# Patient Record
Sex: Female | Born: 1980 | Race: White | Hispanic: No | Marital: Single
Health system: Midwestern US, Community
[De-identification: ages and names within clinical notes are randomized; demographics above are authoritative.]

## PROBLEM LIST (undated history)

## (undated) DIAGNOSIS — R011 Cardiac murmur, unspecified: Secondary | ICD-10-CM

## (undated) DIAGNOSIS — G2581 Restless legs syndrome: Secondary | ICD-10-CM

## (undated) DIAGNOSIS — M1990 Unspecified osteoarthritis, unspecified site: Secondary | ICD-10-CM

## (undated) DIAGNOSIS — E079 Disorder of thyroid, unspecified: Secondary | ICD-10-CM

## (undated) DIAGNOSIS — F32A Depression, unspecified: Secondary | ICD-10-CM

## (undated) DIAGNOSIS — G4700 Insomnia, unspecified: Secondary | ICD-10-CM

## (undated) DIAGNOSIS — E034 Atrophy of thyroid (acquired): Secondary | ICD-10-CM

## (undated) DIAGNOSIS — F5101 Primary insomnia: Secondary | ICD-10-CM

## (undated) DIAGNOSIS — N809 Endometriosis, unspecified: Secondary | ICD-10-CM

## (undated) DIAGNOSIS — K8000 Calculus of gallbladder with acute cholecystitis without obstruction: Secondary | ICD-10-CM

## (undated) DIAGNOSIS — I10 Essential (primary) hypertension: Secondary | ICD-10-CM

## (undated) DIAGNOSIS — N800 Endometriosis of uterus: Secondary | ICD-10-CM

## (undated) DIAGNOSIS — K8020 Calculus of gallbladder without cholecystitis without obstruction: Secondary | ICD-10-CM

## (undated) DIAGNOSIS — E039 Hypothyroidism, unspecified: Secondary | ICD-10-CM

## (undated) DIAGNOSIS — R509 Fever, unspecified: Secondary | ICD-10-CM

## (undated) HISTORY — DX: Insomnia, unspecified: G47.00

## (undated) HISTORY — PX: CHOLECYSTECTOMY: SHX55

## (undated) HISTORY — DX: Restless legs syndrome: G25.81

## (undated) HISTORY — DX: Depression, unspecified: F32.A

## (undated) HISTORY — DX: Unspecified osteoarthritis, unspecified site: M19.90

## (undated) HISTORY — PX: EYE SURGERY: SHX253

## (undated) HISTORY — DX: Cardiac murmur, unspecified: R01.1

## (undated) HISTORY — DX: Disorder of thyroid, unspecified: E07.9

---

## 2013-09-24 DIAGNOSIS — G2581 Restless legs syndrome: Secondary | ICD-10-CM | POA: Insufficient documentation

## 2013-09-24 DIAGNOSIS — R011 Cardiac murmur, unspecified: Secondary | ICD-10-CM | POA: Insufficient documentation

## 2013-09-24 DIAGNOSIS — N809 Endometriosis, unspecified: Secondary | ICD-10-CM | POA: Insufficient documentation

## 2013-09-24 DIAGNOSIS — F32A Depression, unspecified: Secondary | ICD-10-CM | POA: Insufficient documentation

## 2013-09-24 DIAGNOSIS — G4700 Insomnia, unspecified: Secondary | ICD-10-CM | POA: Insufficient documentation

## 2013-09-24 MED ORDER — SUVOREXANT 10 MG TABLET
10 mg | ORAL_TABLET | Freq: Every evening | ORAL | Status: DC | PRN
Start: 2013-09-24 — End: 2013-09-29

## 2013-09-24 NOTE — Progress Notes (Signed)
[facility]  [address]  [place], [place] [address]  Phone: [phone]                                                                                                [name], MD      CC:  Chief Complaint   Patient presents with   ??? Physical     New pt wellness check   ??? Sleep Problem     Having problems sleeping         HPI:    Carla Patterson is a 33 y.o. female would presents for a physical.  She has a history of endometriosis that has been bothering her for the past year.  She says that she doesn't want to have surgery and is currently on another birth control to try to manage this problem.  She says that her lower abdomen feels swollen and warm.   She says that it hurts worse when she has been on her feet all day.  She is worried about the diagnosis of endometriosis, because it hurts all of the time, not just when she is on her menses.  She has intermittent diarrhea and constipation and has already seen a GI doctor for this issue.  She says that she has had a normal colonoscopy in October.  She is desperate to get a solution for this problem.    She is also a chronic insomniac.  She says that as long as she can remember, she hasn't been able to sleep.  She doesn't have much problem with sleep initiation, she has problems staying asleep.  She is usually only able to get 2-3 hours of sleep.  She is chronically fatigued.             PAST MEDICAL HISTORY:    Allergies   Allergen Reactions   ??? Biaxin [Clarithromycin] Rash     History     Social History   ??? Marital Status: SINGLE     Spouse Name: N/A     Number of Children: N/A   ??? Years of Education: N/A     Social History Main Topics   ??? Smoking status: Never Smoker    ??? Smokeless tobacco: Never Used   ??? Alcohol Use: 0.0 oz/week     0 Not specified per week      Comment: occ   ??? Drug Use: No   ??? Sexual Activity: Yes     Other Topics Concern   ??? Not on file     Social History Narrative   ??? No narrative on file     Family History    Problem Relation Age of Onset   ??? Suicide Mother    ??? Cancer Father      Past Surgical History   Procedure Laterality Date   ??? Hx gyn     ??? Hx wisdom teeth extraction       Patient Active Problem List   Diagnosis Code   ??? Depression 311   ??? 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  Insomnia 780.52     No current outpatient prescriptions on file prior to visit.     No current facility-administered medications on file prior to visit.          ROS:    Review of Systems   Constitutional: Positive for malaise/fatigue. Negative for weight loss.   HENT: Negative for congestion and sore throat.    Eyes: Negative for blurred vision, double vision, photophobia and redness.   Respiratory: Negative for cough, shortness of breath and wheezing.    Cardiovascular: Positive for leg swelling. Negative for chest pain.   Gastrointestinal: Positive for abdominal pain and diarrhea. Negative for nausea, vomiting and constipation.   Genitourinary: Positive for flank pain. Negative for dysuria, urgency and frequency.   Skin: Negative for itching and rash.   Neurological: Negative for dizziness, sensory change, speech change, focal weakness, seizures, loss of consciousness and headaches.   Psychiatric/Behavioral: Negative for depression. The patient is not nervous/anxious and does not have insomnia.           BP 124/82 mmHg   Pulse 88   Temp(Src) 98.6 ??F (37 ??C)   Ht 4' 11.75" (1.518 m)   Wt 169 lb 3.2 oz (76.749 kg)   BMI 33.31 kg/m2   SpO2 99%   LMP 08/03/2013    PHYSICAL EXAM:    Physical Exam   Constitutional: She is oriented to person, place, and time and well-developed, well-nourished, and in no distress. No distress.   HENT:   Head: Normocephalic and atraumatic.   Right Ear: External ear normal.   Left Ear: External ear normal.   Nose: Nose normal.   Mouth/Throat: Oropharynx is clear and moist.   Eyes: Conjunctivae and EOM are normal. Pupils are equal, round, and reactive to light.    Neck: Normal range of motion. Neck supple.   Cardiovascular: Normal rate and regular rhythm.    Murmur (2/6 SEM heard best at LSB) heard.  Pulmonary/Chest: Effort normal and breath sounds normal. No respiratory distress.   Abdominal: Soft. Bowel sounds are normal. She exhibits no distension. There is no tenderness (mild discomfort to palpation). There is no rebound.   Musculoskeletal: Normal range of motion. She exhibits no edema or tenderness.   Lymphadenopathy:     She has no cervical adenopathy.   Neurological: She is alert and oriented to person, place, and time. No cranial nerve deficit. Gait normal.   Skin: Skin is warm and dry. No rash noted. She is not diaphoretic.   Psychiatric: Mood, affect and judgment normal.   Vitals reviewed.          ASSESSMENT & PLAN:      1. Routine general medical examination at a health care facility  Will check routine labs today.  - CBC WITH AUTOMATED DIFF  - METABOLIC PANEL, COMPREHENSIVE  - TSH, 3RD GENERATION  - LIPID PANEL    2. Insomnia  Will try Belsomra.  Discussed that she can start with  and increase to  in 1-2 days if necessary.  - suvorexant (BELSOMRA) 10 mg tablet; Take 1 Tab by mouth nightly as needed for Insomnia. Max Daily Amount: 10 mg.  Dispense: 30 Tab; Refill: 0    3. Depression  Stable.    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    5. Endometriosis  Gave information about [facility] in [place].      6. PCOS (polycystic ovarian syndrome)  Will check testosterone  levels.  - TESTOSTERONE, FREE & TOTAL    7. Murmur  Will refer for further evaluation.  - REFERRAL TO CARDIOLOGY

## 2013-09-24 NOTE — Patient Instructions (Signed)
Insomnia: After Your Visit  Your Care Instructions  Insomnia is the inability to sleep well. It is a common problem for most people at some time. Insomnia may make it hard for you to get to sleep, stay asleep, or sleep as long as you need to. This can make you tired and grouchy during the day. It can also make you forgetful, less effective at work, and unhappy.  Insomnia can be caused by conditions such as depression or anxiety. Pain can also affect your ability to sleep. When these problems are solved, the insomnia usually clears up. But sometimes bad sleep habits can cause insomnia.  If insomnia is affecting your work or your enjoyment of life, you can take steps to improve your sleep.  Follow-up care is a key part of your treatment and safety. Be sure to make and go to all appointments, and call your doctor if you are having problems. It???s also a good idea to know your test results and keep a list of the medicines you take.  How can you care for yourself at home?  What to avoid   ?? Do not have drinks with caffeine, such as coffee or black tea, for 8 hours before bed.  ?? Do not smoke or use other types of tobacco near bedtime. Nicotine is a stimulant and can keep you awake.  ?? Avoid drinking alcohol late in the evening, because it can cause you to wake in the middle of the night.  ?? Do not eat a big meal close to bedtime. If you are hungry, eat a light snack.  ?? Do not drink a lot of water close to bedtime, because the need to urinate may wake you up during the night.  ?? Do not read or watch TV in bed. Use the bed only for sleeping and sexual activity.  What to try   ?? Go to bed at the same time every night, and wake up at the same time every morning. Do not take naps during the day.  ?? Keep your bedroom quiet, dark, and cool.  ?? Get regular exercise, but not within 3 to 4 hours of your bedtime.  ?? Sleep on a comfortable pillow and mattress.   ?? If watching the clock makes you anxious, turn it facing away from you so you cannot see the time.  ?? If you worry when you lie down, start a worry book. Well before bedtime, write down your worries, and then set the book and your concerns aside.  ?? Try meditation or other relaxation techniques before you go to bed.  ?? If you cannot fall asleep, get up and go to another room until you feel sleepy. Do something relaxing. Repeat your bedtime routine before you go to bed again.  ?? Make your house quiet and calm about an hour before bedtime. Turn down the lights, turn off the TV, log off the computer, and turn down the volume on music. This can help you relax after a busy day.  When should you call for help?  Watch closely for changes in your health, and be sure to contact your doctor if:  ?? Your efforts to improve your sleep do not work.  ?? Your insomnia gets worse.  ?? You have been feeling down, depressed, or hopeless or have lost interest in things that you usually enjoy.   Where can you learn more?   Go to http://www.healthwise.net/[facility]  Enter P513 in the search box to learn more about "  Insomnia: After Your Visit."   ?? 2006-2015 Healthwise[employer]. Care instructions adapted under license by  (which disclaims liability or warranty for this information). This care instruction is for use with your licensed healthcare professional. If you have questions about a medical condition or this instruction, always ask your healthcare professional. Healthwise, Incorporated disclaims any warranty or liability for your use of this information.  Content Version: 10.5.422740; Current as of: December 05, 2012

## 2013-09-25 LAB — CBC WITH AUTOMATED DIFF
ABS. BASOPHILS: 0 10*3/uL (ref 0.0–0.2)
ABS. EOSINOPHILS: 0.1 10*3/uL (ref 0.0–0.4)
ABS. IMM. GRANS.: 0 10*3/uL (ref 0.0–0.1)
ABS. MONOCYTES: 0.5 10*3/uL (ref 0.1–0.9)
ABS. NEUTROPHILS: 10.6 10*3/uL — ABNORMAL HIGH (ref 1.4–7.0)
Abs Lymphocytes: 3.7 10*3/uL — ABNORMAL HIGH (ref 0.7–3.1)
BASOPHILS: 0 %
EOSINOPHILS: 0 %
HCT: 40.7 % (ref 34.0–46.6)
HGB: 13.7 g/dL (ref 11.1–15.9)
IMMATURE GRANULOCYTES: 0 %
Lymphocytes: 25 %
MCH: 29.6 pg (ref 26.6–33.0)
MCHC: 33.7 g/dL (ref 31.5–35.7)
MCV: 88 fL (ref 79–97)
MONOCYTES: 4 %
NEUTROPHILS: 71 %
PLATELET: 457 10*3/uL — ABNORMAL HIGH (ref 150–379)
RBC: 4.63 x10E6/uL (ref 3.77–5.28)
RDW: 13.6 % (ref 12.3–15.4)
WBC: 15 10*3/uL — ABNORMAL HIGH (ref 3.4–10.8)

## 2013-09-25 LAB — METABOLIC PANEL, COMPREHENSIVE
A-G Ratio: 1.5 (ref 1.1–2.5)
ALT (SGPT): 17 IU/L (ref 0–32)
AST (SGOT): 14 IU/L (ref 0–40)
Albumin: 4.3 g/dL (ref 3.5–5.5)
Alk. phosphatase: 63 IU/L (ref 39–117)
BUN/Creatinine ratio: 12 (ref 8–20)
BUN: 10 mg/dL (ref 6–20)
Bilirubin, total: 0.3 mg/dL (ref 0.0–1.2)
CO2: 20 mmol/L (ref 18–29)
Calcium: 9.4 mg/dL (ref 8.7–10.2)
Chloride: 98 mmol/L (ref 97–108)
Creatinine: 0.83 mg/dL (ref 0.57–1.00)
GFR est AA: 106 mL/min/{1.73_m2} (ref >59)
GFR est non-AA: 92 mL/min/{1.73_m2} (ref >59)
GLOBULIN, TOTAL: 2.8 g/dL (ref 1.5–4.5)
Glucose: 88 mg/dL (ref 65–99)
Potassium: 3.9 mmol/L (ref 3.5–5.2)
Protein, total: 7.1 g/dL (ref 6.0–8.5)
Sodium: 138 mmol/L (ref 134–144)

## 2013-09-25 LAB — LIPID PANEL
Cholesterol, total: 241 mg/dL — ABNORMAL HIGH (ref 100–199)
HDL Cholesterol: 35 mg/dL — ABNORMAL LOW (ref >39)
LDL, calculated: 168 mg/dL — ABNORMAL HIGH (ref 0–99)
Triglyceride: 189 mg/dL — ABNORMAL HIGH (ref 0–149)
VLDL, calculated: 38 mg/dL (ref 5–40)

## 2013-09-25 LAB — TESTOSTERONE, FREE & TOTAL
Free testosterone (Direct): 0.7 pg/mL (ref 0.0–4.2)
Testosterone: 19 ng/dL (ref 8–48)

## 2013-09-25 LAB — TSH 3RD GENERATION: TSH: 7.63 u[IU]/mL — ABNORMAL HIGH (ref 0.450–4.500)

## 2013-09-28 ENCOUNTER — Encounter

## 2013-09-28 MED ORDER — LEVOTHYROXINE 125 MCG TAB
125 mcg | ORAL_TABLET | Freq: Every day | ORAL | Status: DC
Start: 2013-09-28 — End: 2013-09-29

## 2013-09-28 NOTE — Telephone Encounter (Signed)
I called this patient to tell her about her high white blood cell count.  She says that she has been dealing with abdominal pain for the past year. She had a colonoscopy last October which was normal.  She was told it was endometriosis. She says that she forgot to mention that she has been having intermittent fever and chills and is feeling progressively worse since that time. She is also having night sweats. She says that her lower abdomen is hot and feels so painful.  It is worse with movement.  I told her that I think she needs to have a CT scan of her abdomen to rule out infection and that I think she should go to the ER immediately.  I worry that she has a pelvic abscess or abdominal abscess based on chronicity of problem and high white blood cell count and fever.  She agrees with plan.

## 2013-09-28 NOTE — Progress Notes (Signed)
Quick Note:        Patient was notified of abnormal white blood cell count and sent to the ER for an evaluation.    ______

## 2013-09-29 LAB — CBC WITH AUTOMATED DIFF
ABS. BASOPHILS: 0 10*3/uL (ref 0.0–0.2)
ABS. EOSINOPHILS: 0 10*3/uL (ref 0.0–0.8)
ABS. IMM. GRANS.: 0 10*3/uL (ref 0.0–0.5)
ABS. LYMPHOCYTES: 1.6 10*3/uL (ref 0.5–4.6)
ABS. MONOCYTES: 0.2 10*3/uL (ref 0.1–1.3)
ABS. NEUTROPHILS: 6.4 10*3/uL (ref 1.7–8.2)
BASOPHILS: 0 % (ref 0.0–2.0)
EOSINOPHILS: 1 % (ref 0.5–7.8)
HCT: 38.2 % (ref 35.8–46.3)
HGB: 12.9 g/dL (ref 11.7–15.4)
IMMATURE GRANULOCYTES: 0.1 % (ref 0.0–5.0)
LYMPHOCYTES: 20 % (ref 13–44)
MCH: 29.7 PG (ref 26.1–32.9)
MCHC: 33.8 g/dL (ref 31.4–35.0)
MCV: 87.8 FL (ref 79.6–97.8)
MONOCYTES: 3 % — ABNORMAL LOW (ref 4.0–12.0)
MPV: 9.4 FL — ABNORMAL LOW (ref 10.8–14.1)
NEUTROPHILS: 76 % (ref 43–78)
PLATELET: 385 10*3/uL (ref 150–450)
RBC: 4.35 M/uL (ref 4.05–5.25)
RDW: 13.2 % (ref 11.9–14.6)
WBC: 8.3 10*3/uL (ref 4.3–11.1)

## 2013-09-29 LAB — C REACTIVE PROTEIN, QT: C-Reactive protein: 1.8 mg/dL — ABNORMAL HIGH (ref 0.0–0.9)

## 2013-09-29 MED ORDER — TEMAZEPAM 30 MG CAP
30 mg | ORAL_CAPSULE | Freq: Every evening | ORAL | Status: DC | PRN
Start: 2013-09-29 — End: 2014-01-20

## 2013-09-29 MED ORDER — SYNTHROID 25 MCG TABLET
25 mcg | ORAL_TABLET | Freq: Every day | ORAL | Status: DC
Start: 2013-09-29 — End: 2013-12-08

## 2013-09-29 NOTE — Progress Notes (Signed)
[facility]  [address]  [place], [place] [address]  Phone: [phone]                                                                                                Carla Madura, MD      CC:  Chief Complaint   Patient presents with   ??? Thyroid Problem     Wants to talk about medication and about ED visit         HPI:    Carla Patterson is a 33 y.o. female would presents for a follow up on her leukocytosis and ER visit from last night.  She went to the ER for evaluation of abdominal pain in her lower abdomen along with a history of intermittent fever/chills/night sweats and leukocytosis.  She says that she had a CT scan of her abdomen and pelvis that showed that everything was normal except for some free fluid in her pelvis.  She says that her white blood cell count was normal when she was evaluated by the ER at [facility].  She says that they told her that her urine might have a little infection and gave her a prescription for Keflex.  She says that she took only one pill and discontinued it because she doesn't think that she has a UTI and has absolutely no symptoms.  She is here today to follow up.  She is still not feeling great.  She is wanting additional work up on why her white blood cell count was so elevated.  She says that she is also worried about why she has been having intermittent fevers and night sweats.  She says that she does have knee pain, but her other joints are okay.    She also wants to try something else for sleep.  She says that the Belsomra did not work.  She says that she went to sleep initially, but still only slept for 3 hours.  She has already tried [place], [place], [place], and trazodone without success.  She would like to try temazepam.     PAST MEDICAL HISTORY:    Allergies   Allergen Reactions   ??? Biaxin [Clarithromycin] Rash   ??? Wheat Not Reported This Time     History     Social History   ??? Marital Status: SINGLE     Spouse Name: N/A     Number of Children: N/A    ??? Years of Education: N/A     Social History Main Topics   ??? Smoking status: Never Smoker    ??? Smokeless tobacco: Never Used   ??? Alcohol Use: 0.0 oz/week     0 Not specified per week      Comment: occ   ??? Drug Use: No   ??? Sexual Activity: Yes     Other Topics Concern   ??? Not on file     Social History Narrative     Family History   Problem Relation Age of Onset   ??? Suicide Mother    ??? Cancer Father      Past  Surgical History   Procedure Laterality Date   ??? Hx gyn     ??? Hx wisdom teeth extraction       Patient Active Problem List   Diagnosis Code   ??? Depression 311   ??? 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 Insomnia 780.52   ??? Endometriosis 617.9   ??? Restless leg syndrome 333.94   ??? Heart murmur 785.2     Current Outpatient Prescriptions on File Prior to Visit   Medication Sig Dispense Refill   ??? citalopram (CELEXA) 20 mg tablet   0   ??? L-Norgest&E Estradiol-E Estrad (DAYSEE) 0.15 mg-30 mcg (84)/10 mcg (7) 3MPk Take  by mouth.     ??? traMADol (ULTRAM) 50 mg tablet Take 50 mg by mouth every six (6) hours as needed for Pain.       No current facility-administered medications on file prior to visit.          ROS:    Review of Systems   Constitutional: Positive for fever, chills, malaise/fatigue and diaphoresis. Negative for weight loss.   HENT: Negative for congestion and sore throat.    Eyes: Negative for blurred vision, double vision, photophobia and redness.   Respiratory: Negative for cough, shortness of breath and wheezing.    Cardiovascular: Negative for chest pain.   Gastrointestinal: Positive for abdominal pain and diarrhea. Negative for nausea, vomiting and constipation.   Genitourinary: Negative for dysuria, urgency and frequency.   Musculoskeletal: Positive for joint pain. Negative for back pain.   Skin: Negative for itching and rash.   Neurological: Negative for dizziness, sensory change, speech change, focal weakness, seizures, loss of consciousness and headaches.    Psychiatric/Behavioral: Negative for depression. The patient has insomnia. The patient is not nervous/anxious.           BP 122/62 mmHg   Pulse 84   Temp(Src) 98.9 ??F (37.2 ??C)   Ht 4' 11.75" (1.518 m)   Wt 169 lb (76.658 kg)   BMI 33.27 kg/m2   SpO2 99%   LMP 08/03/2013    PHYSICAL EXAM:    Physical Exam   Constitutional: She is oriented to person, place, and time and well-developed, well-nourished, and in no distress. No distress.   HENT:   Head: Normocephalic and atraumatic.   Right Ear: External ear normal.   Left Ear: External ear normal.   Nose: Nose normal.   Mouth/Throat: Oropharynx is clear and moist.   Eyes: Conjunctivae and EOM are normal. Pupils are equal, round, and reactive to light.   Neck: Normal range of motion. Neck supple.   Cardiovascular: Normal rate and regular rhythm.    Murmur (2/6 SEM) heard.  Pulmonary/Chest: Effort normal and breath sounds normal. No respiratory distress.   Abdominal: Soft. Bowel sounds are normal. She exhibits no distension. There is tenderness (mild discomfort to palpation of lower abdomen). There is no rebound.   Musculoskeletal: Normal range of motion. She exhibits no edema or tenderness.   Lymphadenopathy:     She has no cervical adenopathy.   Neurological: She is alert and oriented to person, place, and time. No cranial nerve deficit. Gait normal.   Skin: Skin is warm and dry. No rash noted. She is not diaphoretic.   Psychiatric: Mood, affect and judgment normal.   Vitals reviewed.      ASSESSMENT & PLAN:    1. Leukocytosis  Will check labs today to rule out various other causes of elevation in  her white blood cell count.  - SED RATE (ESR)  - C REACTIVE PROTEIN, QT  - CULTURE, BLOOD  - XR CHEST PA LAT; Future  - CBC WITH AUTOMATED DIFF  - ANA COMPREHENSIVE PLUS PROFILE  - RHEUMATOID FACTOR  - ANCA PANEL  - CULTURE, BLOOD  - SED RATE (ESR)  - C REACTIVE PROTEIN, QT  - ANA COMPREHENSIVE PLUS PROFILE  - ANCA PANEL  - RHEUMATOID FACTOR    2. Night sweats  See above.   - SED RATE (ESR)  - C REACTIVE PROTEIN, QT  - CULTURE, BLOOD  - XR CHEST PA LAT; Future  - CBC WITH AUTOMATED DIFF  - ANA COMPREHENSIVE PLUS PROFILE  - RHEUMATOID FACTOR  - ANCA PANEL  - CULTURE, BLOOD  - SED RATE (ESR)  - C REACTIVE PROTEIN, QT  - ANA COMPREHENSIVE PLUS PROFILE  - ANCA PANEL  - RHEUMATOID FACTOR    3. Fever, unspecified fever cause  Will check additional lab results and a chest x-ray today.  - SED RATE (ESR)  - C REACTIVE PROTEIN, QT  - CULTURE, BLOOD  - XR CHEST PA LAT; Future  - CBC WITH AUTOMATED DIFF  - ANA COMPREHENSIVE PLUS PROFILE  - RHEUMATOID FACTOR  - ANCA PANEL  - CULTURE, BLOOD  - SED RATE (ESR)  - C REACTIVE PROTEIN, QT  - ANA COMPREHENSIVE PLUS PROFILE  - ANCA PANEL  - RHEUMATOID FACTOR    4. Insomnia  Will discontinue belsomra and try restoril instead.  - temazepam (RESTORIL) 30 mg capsule; Take 1 Cap by mouth nightly as needed for Sleep. Max Daily Amount: 30 mg.  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  - SYNTHROID 25 mcg tablet; Take 1 Tab by mouth Daily (before breakfast).  Dispense: 30 Tab; Refill: 0  - CULTURE, BLOOD  - SED RATE (ESR)  - C REACTIVE PROTEIN, QT  - ANA COMPREHENSIVE PLUS PROFILE  - ANCA PANEL  - RHEUMATOID FACTOR

## 2013-09-30 LAB — RHEUMATOID FACTOR, QL: Rheumatoid factor, QL: NEGATIVE

## 2013-09-30 LAB — ANA BY MULTIPLEX FLOW IA, QL
ANA, Direct: NEGATIVE
ANA: NEGATIVE

## 2013-09-30 LAB — ANCA PANEL
ANTIPROTEINASE 3 (PR-3) ABS, 163859: <3.5 U/mL (ref 0.0–3.5)
ATYPICAL PANCA: <1:20 {titer}
Atypical pANCA: <1:20 {titer}
Cytoplasmic (C-ANCA) Ab: <1:20 {titer}
Cytoplasmic (C-ANCA) Ab: <1:20 {titer}
Myeloperoxidase (MPO) Ab: <9 U/mL (ref 0.0–9.0)
Myeloperoxidase (MPO) Abs.: <9 U/mL (ref 0.0–9.0)
PERINUCLEAR (P-ANCA), 162401: <1:20 {titer}
Perinuclear (P-ANCA): <1:20 {titer}
Proteinase 3 (PR-3) Ab: <3.5 U/mL (ref 0.0–3.5)

## 2013-09-30 LAB — SED RATE, AUTOMATED: Sed rate, automated: 22 mm/hr — ABNORMAL HIGH (ref 0–20)

## 2013-09-30 NOTE — Progress Notes (Signed)
Quick Note:        Please let her know that her chest x-ray was normal.    ______

## 2013-09-30 NOTE — Progress Notes (Signed)
Quick Note:        Left message for pt to return call    ______

## 2013-09-30 NOTE — Progress Notes (Signed)
Quick Note:        Please let Carla Patterson know that her white blood cell count was back to normal, but her inflammatory markers were elevated as I suspected. I am still awaiting the results of her ANA, rheumatoid factor and her blood cultures.    ______

## 2013-10-01 NOTE — Progress Notes (Signed)
Quick Note:        Contacted Pt of results    ______

## 2013-10-01 NOTE — Progress Notes (Signed)
Quick Note:        Contacted Pt of results and she will get records from [facility] and bring them by the office,    ______

## 2013-10-02 NOTE — Telephone Encounter (Signed)
Patient called and states that she dropped off medical records to our office and wants you to know, and also would like to ask more questions regarding CT

## 2013-10-04 LAB — CULTURE, BLOOD
Culture result:: NO GROWTH
Culture result:: NO GROWTH

## 2013-10-05 NOTE — Telephone Encounter (Signed)
Pt brought in records to office.  Pt would like to talk to you more about the CT

## 2013-10-05 NOTE — Telephone Encounter (Signed)
I called and she did not answer.

## 2013-11-20 ENCOUNTER — Telehealth

## 2013-11-20 NOTE — Telephone Encounter (Signed)
Please call and ask Carla Patterson if she is still having intermittent fevers.  We should probably recheck her lab work again because her inflammatory markers were elevated last time even though all of the other labs were normal.  She may need to see a rheumatologist for further evaluation.  See if she doesn't mind coming in for lab alone.

## 2013-11-24 NOTE — Telephone Encounter (Signed)
Pt will make an appointment to come in just for lab work.

## 2013-11-26 ENCOUNTER — Encounter: Admit: 2013-11-26 | Discharge: 2013-11-26 | Payer: BLUE CROSS/BLUE SHIELD | Primary: Family Medicine

## 2013-11-26 DIAGNOSIS — R509 Fever, unspecified: Secondary | ICD-10-CM

## 2013-11-27 LAB — TSH 3RD GENERATION: TSH: 0.402 u[IU]/mL — ABNORMAL LOW (ref 0.450–4.500)

## 2013-11-27 LAB — THYROID PEROXIDASE (TPO) AB: Thyroid peroxidase Ab: 184 IU/mL — ABNORMAL HIGH (ref 0–34)

## 2013-11-27 LAB — ANA COMPREHENSIVE PLUS PANEL
Anti Ribosomal P Ab: <0.2 AI (ref 0.0–0.9)
Anti-DNA (DS) Ab, QT: 1 IU/mL (ref 0–9)
Anti-DNA (DS) Ab, QT: 1 IU/mL (ref 0–9)
Anti-Jo-1: <0.2 AI (ref 0.0–0.9)
Anti-Jo-1: <0.2 AI (ref 0.0–0.9)
Antichromatin Ab: <0.2 AI (ref 0.0–0.9)
Antichromatin Antibodies: <0.2 AI (ref 0.0–0.9)
Antiribosomal P Antibodies: <0.2 AI (ref 0.0–0.9)
Antiscleroderma-70 Antibodies: <0.2 AI (ref 0.0–0.9)
Centromere B Ab: <0.2 AI (ref 0.0–0.9)
Centromere B Antibody: <0.2 AI (ref 0.0–0.9)
RNP ABS: <0.2 AI (ref 0.0–0.9)
RNP Abs: <0.2 AI (ref 0.0–0.9)
SMITH/RNP ANTIBODIES, 016376: <0.2 AI (ref 0.0–0.9)
Scleroderma-70 Ab: <0.2 AI (ref 0.0–0.9)
Sjogren's Anti-SS-A: <0.2 AI (ref 0.0–0.9)
Sjogren's Anti-SS-A: <0.2 AI (ref 0.0–0.9)
Sjogren's Anti-SS-B: <0.2 AI (ref 0.0–0.9)
Sjogren's Anti-SS-B: <0.2 AI (ref 0.0–0.9)
Smith Abs: <0.2 AI (ref 0.0–0.9)
Smith Abs: <0.2 AI (ref 0.0–0.9)
Smith/RNP Abs: <0.2 AI (ref 0.0–0.9)

## 2013-11-27 LAB — SED RATE (ESR): Sed rate (ESR): 2 mm/hr (ref 0–32)

## 2013-11-27 LAB — C REACTIVE PROTEIN, QT: C-Reactive Protein, Qt: 13.8 mg/L — ABNORMAL HIGH (ref 0.0–4.9)

## 2013-12-02 NOTE — Progress Notes (Signed)
Quick Note:        Contacted Pt told her to drop it down to 100mg     ______

## 2013-12-02 NOTE — Progress Notes (Signed)
Quick Note:        Please let Carla Patterson know that her thyroid is abnormal again. We need to decrease her medication. I would like to see her again for a visit. I am still concerned about what is going on with her.    ______

## 2013-12-02 NOTE — Progress Notes (Signed)
Quick Note:        Contacted Pt of results and she wanted to know if you wanted her to go down to 100 mcg. She has them on hand.    ______

## 2013-12-02 NOTE — Progress Notes (Signed)
Quick Note:        Yes go back down to 100    ______

## 2013-12-08 ENCOUNTER — Ambulatory Visit
Admit: 2013-12-08 | Discharge: 2013-12-08 | Payer: BLUE CROSS/BLUE SHIELD | Attending: Family Medicine | Primary: Family Medicine

## 2013-12-08 DIAGNOSIS — E034 Atrophy of thyroid (acquired): Secondary | ICD-10-CM

## 2013-12-08 LAB — AMB POC RAPID INFLUENZA TEST: QuickVue Influenza test: NEGATIVE

## 2013-12-08 MED ORDER — PROMETHAZINE 25 MG TAB
25 mg | ORAL_TABLET | Freq: Four times a day (QID) | ORAL | Status: DC | PRN
Start: 2013-12-08 — End: 2014-01-08

## 2013-12-08 NOTE — Progress Notes (Signed)
[facility]  [address]  [place], [place] [address]  Phone: [phone]                                                                                                Carla MaduraJennifer L Nichoel Digiulio, MD      CC:  Chief Complaint   Patient presents with   ??? Nausea     has been worse also wants to find out about labs   ??? Vomiting     going on 3 weeks off and on         HPI:    [name] is a 33 y.o. female would presents for nausea and vomiting for the past three weeks intermittent.  It doesn't matter what she eats.  She will vomit when she is just brushing her teeth.  She admits to low grade fever around 99.0.  She says that there was a 24 hour stomach bug at her work, but says that her nausea and vomiting has lasted longer than everyone else's.  She says that when the nausea hits her, it hits fast.  She does not need to vomit when she is sleeping.  She will wake up nauseated.  She is vomiting about 2 times per day.  She denies any dizziness.    Upon further recheck of her labs, she had an elevated TPO and still had an elevated C-reactive protein.  She says that the night sweats have improved.          PAST MEDICAL HISTORY:    Allergies   Allergen Reactions   ??? Biaxin [Clarithromycin] Rash   ??? Wheat Not Reported This Time     History     Social History   ??? Marital Status: SINGLE     Spouse Name: N/A     Number of Children: N/A   ??? Years of Education: N/A     Social History Main Topics   ??? Smoking status: Never Smoker    ??? Smokeless tobacco: Never Used   ??? Alcohol Use: 0.0 oz/week     0 Not specified per week      Comment: occ   ??? Drug Use: No   ??? Sexual Activity: Yes     Other Topics Concern   ??? Not on file     Social History Narrative     Family History   Problem Relation Age of Onset   ??? Suicide Mother    ??? Cancer Father      Past Surgical History   Procedure Laterality Date   ??? Hx gyn     ??? Hx wisdom teeth extraction       Patient Active Problem List   Diagnosis Code   ??? Depression F32.9    ??? 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 Endometriosis N80.9   ??? Restless leg syndrome G25.81   ??? Heart murmur R01.1     Current Outpatient Prescriptions on File Prior to Visit   Medication Sig Dispense Refill   ??? SYNTHROID 100 mcg  tablet   0   ??? temazepam (RESTORIL) 30 mg capsule Take 1 Cap by mouth nightly as needed for Sleep. Max Daily Amount: 30 mg. 30 Cap 5   ??? citalopram (CELEXA) 20 mg tablet   0   ??? L-Norgest&E Estradiol-E Estrad (DAYSEE) 0.15 mg-30 mcg (84)/10 mcg (7) 3MPk Take  by mouth.     ??? traMADol (ULTRAM) 50 mg tablet Take 50 mg by mouth every six (6) hours as needed for Pain.       No current facility-administered medications on file prior to visit.          ROS:    Review of Systems   Constitutional: Positive for fever, chills, malaise/fatigue and diaphoresis (at night). Negative for weight loss.   Respiratory: Negative for cough and shortness of breath.    Cardiovascular: Negative for chest pain and palpitations.   Gastrointestinal: Positive for nausea, vomiting and abdominal pain.   Skin: Negative for itching and rash.          BP 130/82 mmHg   Pulse 103   Temp(Src) 98.5 ??F (36.9 ??C)   Ht 4' 11.75" (1.518 m)   Wt 169 lb 3.2 oz (76.749 kg)   BMI 33.31 kg/m2   SpO2 99%    PHYSICAL EXAM:    Physical Exam   Constitutional: She is oriented to person, place, and time and well-developed, well-nourished, and in no distress. No distress.   HENT:   Head: Normocephalic and atraumatic.   Right Ear: External ear normal.   Left Ear: External ear normal.   Nose: Nose normal.   Mouth/Throat: Oropharynx is clear and moist.   Eyes: Conjunctivae and EOM are normal. Pupils are equal, round, and reactive to light.   Neck: Normal range of motion. Neck supple.   Cardiovascular: Normal rate, regular rhythm and normal heart sounds.    No murmur heard.  Pulmonary/Chest: Effort normal and breath sounds normal. No respiratory distress.    Abdominal: Soft. Bowel sounds are normal. She exhibits no distension. Tenderness: diffuse abdominal discomfort to palpation. There is no rebound.   Musculoskeletal: Normal range of motion. She exhibits no edema or tenderness.   Lymphadenopathy:     She has no cervical adenopathy.   Neurological: She is alert and oriented to person, place, and time. No cranial nerve deficit. Gait normal.   Skin: Skin is warm and dry. No rash noted. She is not diaphoretic.   Psychiatric: Mood, affect and judgment normal.   Vitals reviewed.          ASSESSMENT & PLAN:    1. Hypothyroidism due to acquired atrophy of thyroid  Will check thyroid.    - US THYROID/PARATHYROID/SOFT TISS; Future    2. Nausea with vomiting  Will chec ultrasound of abdomen to assess  Gallbladder.  BAsed on new  - RAPID INFLUENZA  A  - US ABD COMP; Future  - promethazine (PHENERGAN) 25 mg tablet; Take 1 Tab by mouth every six (6) hours as needed for Nausea for up to 30 doses.  Dispense: 30 Tab; Refill: 0

## 2013-12-08 NOTE — Patient Instructions (Signed)
Hashimoto's Thyroiditis: After Your Visit  Your Care Instructions     Hashimoto's thyroiditis is a problem with the thyroid gland. The thyroid gland, which is in your neck, controls the way your body uses energy. Sometimes the disease causes the gland to make too much thyroid hormone (thyrotoxicosis). This can make you feel nervous, lose weight, and have many loose bowel movements. You may also have a fast heartbeat.  But as the disease progresses, the gland usually does not make enough thyroid hormone. This can cause you to feel tired and have dry skin and thinning hair. Most people with Hashimoto's are diagnosed when they have these symptoms.  You may need to take medicine if you have symptoms or if your thyroid hormone level is not normal. Most people with Hashimoto's thyroiditis need to take medicine for the rest of their lives.  Follow-up care is a key part of your treatment and safety. Be sure to make and go to all appointments, and call your doctor if you are having problems. It???s also a good idea to know your test results and keep a list of the medicines you take.  How can you care for yourself at home?  ?? Take your medicines exactly as prescribed. Call your doctor if you think you are having a problem with your medicine. You will get more details on the specific medicines your doctor prescribes.  When should you call for help?  Call 911 anytime you think you may need emergency care. For example, call if:  ?? You passed out (lost consciousness).  ?? You have severe trouble breathing.  ?? You have a very slow heartbeat (less than 60 beats a minute).  ?? You have a low body temperature (95??F or below).  Watch closely for changes in your health, and be sure to contact your doctor if:  ?? You gain weight even though you are eating normally or less than usual.  ?? You feel extremely weak or tired.  ?? You have new changes in your skin, nails, or hair, or the changes get worse.   ?? You notice that your thyroid gland has grown or changed in size.  ?? You have constipation that is new or that gets worse.  ?? You cannot stand cold temperatures.  ?? You have heavy or irregular menstrual periods.  ?? You have other new symptoms.   Where can you learn more?   Go to [email or web address].healthwise.net/[email or web address]  Enter K454 in the search box to learn more about "Hashimoto's Thyroiditis: After Your Visit."   ?? 2006-2015 Healthwise, Incorporated. Care instructions adapted under license by [employer] [place] (which disclaims liability or warranty for this information). This care instruction is for use with your licensed healthcare professional. If you have questions about a medical condition or this instruction, always ask your healthcare professional. Healthwise, Incorporated disclaims any warranty or liability for your use of this information.  Content Version: 10.5.422740; Current as of: December 05, 2012              Abdominal Pain: After Your Visit  Your Care Instructions     Abdominal pain has many possible causes. Some aren't serious and get better on their own in a few days. Others need more testing and treatment. If your pain continues or gets worse, you need to be rechecked and may need more tests to find out what is wrong. You may need surgery to correct the problem.  Don't ignore new symptoms, such as fever, nausea and vomiting, urination problems,  pain that gets worse, and dizziness. These may be signs of a more serious problem.  Your doctor may have recommended a follow-up visit in the next 8 to 12 hours. If you are not getting better, you may need more tests or treatment.  The doctor has checked you carefully, but problems can develop later. If you notice any problems or new symptoms, get medical treatment right away.  Follow-up care is a key part of your treatment and safety. Be sure to make and go to all appointments, and call your doctor if you are having  problems. It's also a good idea to know your test results and keep a list of the medicines you take.  How can you care for yourself at home?  ?? Rest until you feel better.  ?? To prevent dehydration, drink plenty of fluids, enough so that your urine is light yellow or clear like water. Choose water and other caffeine-free clear liquids until you feel better. If you have kidney, heart, or liver disease and have to limit fluids, talk with your doctor before you increase the amount of fluids you drink.  ?? If your stomach is upset, eat mild foods, such as rice, dry toast or crackers, bananas, and applesauce. Try eating several small meals instead of two or three large ones.  ?? Wait until 48 hours after all symptoms have gone away before you have spicy foods, alcohol, and drinks that contain caffeine.  ?? Do not eat foods that are high in fat.  ?? Avoid anti-inflammatory medicines such as aspirin, ibuprofen (Advil, Motrin), and naproxen (Aleve). These can cause stomach upset. Talk to your doctor if you take daily aspirin for another health problem.  When should you call for help?  Call 911 anytime you think you may need emergency care. For example, call if:  ?? You passed out (lost consciousness).  ?? You pass maroon or very bloody stools.  ?? You vomit blood or what looks like coffee grounds.  ?? You have new, severe belly pain.  Call your doctor now or seek immediate medical care if:  ?? Your pain gets worse, especially if it becomes focused in one area of your belly.  ?? You have a new or higher fever.  ?? Your stools are black and look like tar, or they have streaks of blood.  ?? You have unexpected vaginal bleeding.  ?? You have symptoms of a urinary tract infection. These may include:  ?? Pain when you urinate.  ?? Urinating more often than usual.  ?? Blood in your urine.  ?? You are dizzy or lightheaded, or you feel like you may faint.  Watch closely for changes in your health, and be sure to contact your doctor if:   ?? You are not getting better after 1 day (24 hours).   Where can you learn more?   Go to [email or web address]://www.healthwise.net/[email or web address]  Enter [phone] in the search box to learn more about "Abdominal Pain: After Your Visit."   ?? 2006-2015 Healthwise, Incorporated. Care instructions adapted under license by [employer] [place] (which disclaims liability or warranty for this information). This care instruction is for use with your licensed healthcare professional. If you have questions about a medical condition or this instruction, always ask your healthcare professional. Healthwise, Incorporated disclaims any warranty or liability for your use of this information.  Content Version: 10.5.422740; Current as of: June 25, 2012

## 2013-12-21 NOTE — Telephone Encounter (Signed)
Is getting US on thyroid tomorrow and wanted to no if it is ok to take her synthroid.  Told pt that was OK.

## 2013-12-22 ENCOUNTER — Telehealth

## 2013-12-22 ENCOUNTER — Inpatient Hospital Stay: Admit: 2013-12-22 | Payer: BLUE CROSS/BLUE SHIELD | Attending: Family Medicine | Primary: Family Medicine

## 2013-12-22 DIAGNOSIS — E038 Other specified hypothyroidism: Secondary | ICD-10-CM

## 2013-12-22 DIAGNOSIS — E034 Atrophy of thyroid (acquired): Secondary | ICD-10-CM

## 2013-12-22 MED ORDER — ONDANSETRON 8 MG TAB, RAPID DISSOLVE
8 mg | ORAL_TABLET | Freq: Three times a day (TID) | ORAL | Status: DC | PRN
Start: 2013-12-22 — End: 2014-01-20

## 2013-12-22 NOTE — Telephone Encounter (Signed)
done

## 2013-12-22 NOTE — Telephone Encounter (Signed)
Would like to get zofran has been off Celexa for 2 weeks

## 2013-12-22 NOTE — Progress Notes (Signed)
Quick Note:        Contacted Pt of results    ______

## 2013-12-22 NOTE — Progress Notes (Signed)
Quick Note:        Please let her know that her thyroid ultrasound is normal.    ______

## 2013-12-22 NOTE — Progress Notes (Signed)
Quick Note:        Contacted Pt of results and she would like to see a Development worker, international aidgeneral surgeon.    ______

## 2013-12-22 NOTE — Progress Notes (Signed)
Quick Note:        Please let her know that her abdominal ultrasound shows gallstones. I will refer her to a general surgeon to discuss removing it since she is having trouble with pain and nausea.    ______

## 2014-01-08 ENCOUNTER — Ambulatory Visit
Admit: 2014-01-08 | Discharge: 2014-01-08 | Payer: BLUE CROSS/BLUE SHIELD | Attending: Surgery | Primary: Family Medicine

## 2014-01-08 DIAGNOSIS — K8020 Calculus of gallbladder without cholecystitis without obstruction: Secondary | ICD-10-CM

## 2014-01-08 NOTE — Patient Instructions (Signed)
[name]  [address], [address]  [place] [address]  [phone]    CARE INSTRUCTIONS  Your surgeon has given you verbal instructions regarding your plan of care today.    FOLLOW-UP AFTER A TEST OR STUDY  If you are having labs drawn or a radiologic study, there will be a follow-up appt needed after that, which either has already been made for you today or will need to be made by you to review the results of your study or test (unless special arrangements were made today for you by your surgeon).      We are unable to review results over the phone because those results invariably lead to questions that you and your surgeon need to make together during an appt.  Therefore, please do not call the office for test results as they should be discussed at your next appt, and if you don't have a next appt, please call and get one after your test is completed, or as you were instructed.    AFTER-HOURS CALLS  We have a surgeon-call for emergencies 24 hrs a day.  After the office is closed, which is usually at 5PM and on weekends and holidays, the on-call surgeon can be reached by dialing our office number ([phone]).    Please do not call unless you are having an urgent or emergent issue.  The on-call surgeon cannot make appt changes or call in refills for pain medications after the office is closed.

## 2014-01-11 ENCOUNTER — Ambulatory Visit
Admit: 2014-01-11 | Discharge: 2014-01-11 | Payer: BLUE CROSS/BLUE SHIELD | Attending: Family Medicine | Primary: Family Medicine

## 2014-01-11 DIAGNOSIS — K8020 Calculus of gallbladder without cholecystitis without obstruction: Secondary | ICD-10-CM

## 2014-01-11 MED ORDER — LEVOTHYROXINE 100 MCG TAB
100 mcg | ORAL_TABLET | Freq: Every day | ORAL | Status: DC
Start: 2014-01-11 — End: 2014-01-20

## 2014-01-11 NOTE — Patient Instructions (Signed)
Biliary Colic: After Your Visit  Your Care Instructions     Biliary (say "BILL-ee-air-ee") colic is belly pain caused by gallbladder problems. It is usually caused by a gallstone moving through or blocking the common bile duct or cystic duct.  Gallstones are stones that form in the gallbladder. They are made of cholesterol and other substances. The gallbladder is a small sac located just under the liver. It stores bile released by the liver. Bile helps you digest fats.  Gallstones also can form in the common bile duct or cystic duct. These ducts carry bile from the gallbladder and the liver to the small intestine. Gallstones may be as small as a grain of sand or as large as a golf ball.  Gallstones that cause severe symptoms usually are treated with surgery to remove the gallbladder. If the first attack of biliary colic is mild, it is often safe to wait until you have had another attack before you think about having surgery.  The doctor has checked you carefully, but problems can develop later. If you notice any problems or new symptoms, get medical treatment right away.  Follow-up care is a key part of your treatment and safety. Be sure to make and go to all appointments, and call your doctor if you are having problems. It's also a good idea to know your test results and keep a list of the medicines you take.  How can you care for yourself at home?  ?? Take pain medicines exactly as directed.  ?? If the doctor gave you a prescription medicine for pain, take it as prescribed.  ?? If you are not taking a prescription pain medicine, ask your doctor if you can take an over-the-counter medicine. Read and follow all instructions on the label.  ?? Avoid foods that cause symptoms, especially fatty foods. These can cause biliary colic.  ?? You may need more tests to look at your gallbladder.  When should you call for help?  Call your doctor now or seek immediate medical care if:  ?? You have a fever.   ?? You have new belly pain, or your pain gets worse.  ?? There is a new or increasing yellow tint to your skin or the whites of your eyes.  ?? Your urine is dark yellow-brown, or your stools are light-colored or white.  ?? You cannot keep down fluids.  Watch closely for changes in your health, and be sure to contact your doctor if:  ?? You do not get better as expected.  ?? You are not getting better after 1 day (24 hours).   Where can you learn more?   Go to http://www.healthwise.net/[facility]  Enter X038 in the search box to learn more about "Biliary Colic: After Your Visit."   ?? 2006-2015 Healthwise, Incorporated. Care instructions adapted under license by [place] (which disclaims liability or warranty for this information). This care instruction is for use with your licensed healthcare professional. If you have questions about a medical condition or this instruction, always ask your healthcare professional. Healthwise, Incorporated disclaims any warranty or liability for your use of this information.  Content Version: 10.5.422740; Current as of: December 05, 2012

## 2014-01-11 NOTE — Progress Notes (Signed)
[facility]  [address]  [place], [place] [address]  Phone: [phone]                                                                                                Teresita MaduraJennifer L Toy Eisemann, MD      CC:  Chief Complaint   Patient presents with   ??? Thyroid Problem     needs refill   ??? Abdominal Pain     needs gallbladder removed         HPI:    Carla Patterson is a 33 y.o. female would presents for a follow up on her gallbladder issues.  She was nervous about the surgery. She says that the surgeon told her that it might not alleviate all of her symptoms.  She thinks that it is likely the cause of most of her symptoms.      She is also here to get her thyroid rechecked.  She has been feeling better as far as her thyroid is concerned.  She is hoping that her abdominal problems will resolve.  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       PAST MEDICAL HISTORY:    Allergies   Allergen Reactions   ??? Biaxin [Clarithromycin] Rash     History     Social History   ??? Marital Status: SINGLE     Spouse Name: N/A     Number of Children: N/A   ??? Years of Education: N/A     Social History Main Topics   ??? Smoking status: Never Smoker    ??? Smokeless tobacco: Never Used   ??? Alcohol Use: 0.0 oz/week     0 Not specified per week      Comment: occ   ??? Drug Use: No   ??? Sexual Activity: Not on file     Other Topics Concern   ??? Not on file     Social History Narrative     Family History   Problem Relation Age of Onset   ??? Suicide Mother    ??? Cancer Father      Past Surgical History   Procedure Laterality Date   ??? Hx wisdom teeth extraction     ??? Hx heent       laser eye surgery age 89     Patient Active Problem List   Diagnosis Code   ??? Depression F32.9   ??? 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 Endometriosis N80.9   ??? Restless leg syndrome G25.81   ??? Heart murmur R01.1   ??? 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 Epigastric pain R10.13   ??? Nausea R11.0      Current Outpatient Prescriptions on File Prior to Visit   Medication Sig Dispense Refill   ??? traMADol (ULTRAM) 50 mg tablet Take 1 Tab by mouth every six (6) hours as needed for Pain. Max Daily Amount: 200 mg. 1 Tab 0   ??? HYDROcodone-acetaminophen (NORCO) 5-325 mg per tablet Take 1-2 Tabs  by mouth every six (6) hours as needed for Pain. Max Daily Amount: 8 Tabs. 50 Tab 0   ??? ondansetron (ZOFRAN ODT) 8 mg disintegrating tablet Take 0.5 Tabs by mouth every eight (8) hours as needed for Nausea (and vomiting). 30 Tab 1   ??? L-Norgest&E Estradiol-E Estrad (DAYSEE) 0.15 mg-30 mcg (84)/10 mcg (7) 3MPk Take 1 Tab by mouth nightly.       No current facility-administered medications on file prior to visit.          ROS:    Review of Systems   Constitutional: Negative for fever and chills.   Respiratory: Negative for cough and sputum production.    Cardiovascular: Negative for chest pain and palpitations.   Gastrointestinal: Positive for nausea, abdominal pain and constipation. Negative for vomiting, diarrhea, blood in stool and melena.          BP 138/88 mmHg   Pulse 96   Temp(Src) 98.1 ??F (36.7 ??C) (Oral)   Ht 4' 11.75" (1.518 m)   Wt 165 lb 6.4 oz (75.025 kg)   BMI 32.56 kg/m2   SpO2 100%    PHYSICAL EXAM:    Physical Exam   Constitutional: She is oriented to person, place, and time and well-developed, well-nourished, and in no distress. No distress.   HENT:   Head: Normocephalic and atraumatic.   Right Ear: External ear normal.   Left Ear: External ear normal.   Nose: Nose normal.   Mouth/Throat: Oropharynx is clear and moist.   Eyes: Conjunctivae and EOM are normal. Pupils are equal, round, and reactive to light.   Neck: Normal range of motion. Neck supple.   Cardiovascular: Normal rate, regular rhythm and normal heart sounds.    No murmur heard.  Pulmonary/Chest: Effort normal and breath sounds normal. No respiratory distress.   Abdominal: Soft. Bowel sounds are normal. She exhibits no distension.  There is no tenderness. There is no rebound.   Musculoskeletal: Normal range of motion. She exhibits no edema or tenderness.   Lymphadenopathy:     She has no cervical adenopathy.   Neurological: She is alert and oriented to person, place, and time. No cranial nerve deficit. Gait normal.   Skin: Skin is warm and dry. No rash noted. She is not diaphoretic.   Psychiatric: Mood, affect and judgment normal.   Vitals reviewed.      ASSESSMENT & PLAN:    1. Cholelithiasis without obstruction  Encouraged her to have gallbladder removed.    - CBC WITH AUTOMATED DIFF  - METABOLIC PANEL, COMPREHENSIVE  - AMYLASE  - LIPASE    2. Hypothyroidism due to acquired atrophy of thyroid  Will recheck today.    - levothyroxine (SYNTHROID) 100 mcg tablet; Take 1 Tab by mouth Daily (before breakfast). Indications: HYPOTHYROIDISM  Dispense: 30 Tab; Refill: 3  - TSH, 3RD GENERATION    3. Depression  Currently stable off of medications.

## 2014-01-12 LAB — LIPASE: Lipase: 25 U/L (ref 0–59)

## 2014-01-12 LAB — CBC WITH AUTOMATED DIFF
ABS. BASOPHILS: 0 10*3/uL (ref 0.0–0.2)
ABS. EOSINOPHILS: 0.1 10*3/uL (ref 0.0–0.4)
ABS. IMM. GRANS.: 0 10*3/uL (ref 0.0–0.1)
ABS. MONOCYTES: 0.3 10*3/uL (ref 0.1–0.9)
ABS. NEUTROPHILS: 7.1 10*3/uL — ABNORMAL HIGH (ref 1.4–7.0)
Abs Lymphocytes: 2.1 10*3/uL (ref 0.7–3.1)
BASOPHILS: 0 %
EOSINOPHILS: 1 %
HCT: 40.8 % (ref 34.0–46.6)
HGB: 13.8 g/dL (ref 11.1–15.9)
IMMATURE GRANULOCYTES: 0 %
Lymphocytes: 22 %
MCH: 29.2 pg (ref 26.6–33.0)
MCHC: 33.8 g/dL (ref 31.5–35.7)
MCV: 86 fL (ref 79–97)
MONOCYTES: 3 %
NEUTROPHILS: 74 %
PLATELET: 446 10*3/uL — ABNORMAL HIGH (ref 150–379)
RBC: 4.73 x10E6/uL (ref 3.77–5.28)
RDW: 13.4 % (ref 12.3–15.4)
WBC: 9.6 10*3/uL (ref 3.4–10.8)

## 2014-01-12 LAB — METABOLIC PANEL, COMPREHENSIVE
A-G Ratio: 1.5 (ref 1.1–2.5)
ALT (SGPT): 10 IU/L (ref 0–32)
AST (SGOT): 18 IU/L (ref 0–40)
Albumin: 4.1 g/dL (ref 3.5–5.5)
Alk. phosphatase: 73 IU/L (ref 39–117)
BUN/Creatinine ratio: 13 (ref 8–20)
BUN: 11 mg/dL (ref 6–20)
Bilirubin, total: 0.2 mg/dL (ref 0.0–1.2)
CO2: 23 mmol/L (ref 18–29)
Calcium: 9.4 mg/dL (ref 8.7–10.2)
Chloride: 102 mmol/L (ref 97–108)
Creatinine: 0.86 mg/dL (ref 0.57–1.00)
GFR est AA: 101 mL/min/{1.73_m2} (ref >59)
GFR est non-AA: 88 mL/min/{1.73_m2} (ref >59)
GLOBULIN, TOTAL: 2.8 g/dL (ref 1.5–4.5)
Glucose: 85 mg/dL (ref 65–99)
Potassium: 4.5 mmol/L (ref 3.5–5.2)
Protein, total: 6.9 g/dL (ref 6.0–8.5)
Sodium: 140 mmol/L (ref 134–144)

## 2014-01-12 LAB — TSH 3RD GENERATION: TSH: 1.11 u[IU]/mL (ref 0.450–4.500)

## 2014-01-12 LAB — AMYLASE: Amylase: 43 U/L (ref 31–124)

## 2014-01-20 ENCOUNTER — Inpatient Hospital Stay: Primary: Family Medicine

## 2014-01-20 NOTE — Other (Addendum)
Patient verified name, DOB, and surgery as listed in [facility].    TYPE  CASE:2            Orders:on chart  Labs per surgeon:  none   Labs per anesthesia protocol: hgb-pt had recent labs [facility] results in [facility]---  hcg dos  EKG  :   Per pt recent at [facility]-- kristie to fax lastest records    Instructed patient to continue previous medications as prescribed prior to surgery and  to take the following medications the day of surgery according to anesthesia guidelines with a small sip of water : synthroid         Continue all previous medications unless otherwise directed.     Instructed patient to hold  the following medications prior to surgery:none    Patient instructed on the following and verbalized understanding:  Arrive at main   entrance, time of arrival to be called the day before by 1700.  Responsible adult must drive patient to and from hospital, stay during surgery and 24 hours postoperatively.  Npo after midnight including gum, mints and ice chips.  Use hibiclens in the shower the night before and the morning of surgery.  Leave all valuables at home. Instructed on no jewelry or body piercings on the dos.  Bring insurance card and ID.  No perfumes, oil, powder, colognes, makeup or  lotions on the skin.    Patient verbalized understanding of all instructions and provided all medical/health information to the best of their ability.

## 2014-01-20 NOTE — Other (Signed)
rec'd cardiac records from [facility]--- ekg, stress, echo, office note---all dated 10/2013--- placed on chart as needed

## 2014-01-26 ENCOUNTER — Inpatient Hospital Stay: Payer: BLUE CROSS/BLUE SHIELD

## 2014-01-26 DIAGNOSIS — K8020 Calculus of gallbladder without cholecystitis without obstruction: Secondary | ICD-10-CM | POA: Insufficient documentation

## 2014-01-26 DIAGNOSIS — R1013 Epigastric pain: Secondary | ICD-10-CM | POA: Insufficient documentation

## 2014-01-26 LAB — HCG URINE, QL. - POC: Pregnancy test,urine (POC): NEGATIVE

## 2014-01-26 MED ORDER — LIDOCAINE (PF) 20 MG/ML (2 %) IV SYRINGE
100 mg/5 mL (2 %) | INTRAVENOUS | Status: AC
Start: 2014-01-26 — End: ?

## 2014-01-26 MED ORDER — ONDANSETRON (PF) 4 MG/2 ML INJECTION
4 mg/2 mL | INTRAMUSCULAR | Status: AC
Start: 2014-01-26 — End: ?

## 2014-01-26 MED ORDER — KETOROLAC TROMETHAMINE 15 MG/ML INJECTION
15 mg/mL | INTRAMUSCULAR | Status: AC
Start: 2014-01-26 — End: ?

## 2014-01-26 MED ORDER — ROCURONIUM 10 MG/ML IV
10 mg/mL | INTRAVENOUS | Status: AC
Start: 2014-01-26 — End: ?

## 2014-01-26 MED ORDER — CEFAZOLIN 2 GRAM/50 ML NS IVPB
Freq: Once | INTRAVENOUS | Status: DC
Start: 2014-01-26 — End: 2014-01-26

## 2014-01-26 MED ORDER — HYDROCODONE-ACETAMINOPHEN 5 MG-325 MG TAB
5-325 mg | ORAL | Status: DC | PRN
Start: 2014-01-26 — End: 2014-01-26
  Administered 2014-01-26: 16:00:00 via ORAL

## 2014-01-26 MED ORDER — MIDAZOLAM 1 MG/ML IJ SOLN
1 mg/mL | Freq: Once | INTRAMUSCULAR | Status: AC
Start: 2014-01-26 — End: 2014-01-26
  Administered 2014-01-26: 14:00:00 via INTRAVENOUS

## 2014-01-26 MED ORDER — HYDROMORPHONE (PF) 2 MG/ML IJ SOLN
2 mg/mL | INTRAMUSCULAR | Status: DC | PRN
Start: 2014-01-26 — End: 2014-01-26

## 2014-01-26 MED ORDER — HYDROCODONE-ACETAMINOPHEN 5 MG-325 MG TAB
5-325 mg | ORAL_TABLET | Freq: Four times a day (QID) | ORAL | Status: DC | PRN
Start: 2014-01-26 — End: 2014-04-05

## 2014-01-26 MED ORDER — LIDOCAINE HCL 1 % (10 MG/ML) IJ SOLN
10 mg/mL (1 %) | INTRAMUSCULAR | Status: DC | PRN
Start: 2014-01-26 — End: 2014-01-26

## 2014-01-26 MED ORDER — ROCURONIUM 10 MG/ML IV
10 mg/mL | INTRAVENOUS | Status: DC | PRN
Start: 2014-01-26 — End: 2014-01-26
  Administered 2014-01-26 (×2): via INTRAVENOUS

## 2014-01-26 MED ORDER — NEOSTIGMINE METHYLSULFATE 1 MG/ML INJECTION
1 mg/mL | INTRAMUSCULAR | Status: DC | PRN
Start: 2014-01-26 — End: 2014-01-26
  Administered 2014-01-26: 15:00:00 via INTRAVENOUS

## 2014-01-26 MED ORDER — PROMETHAZINE 25 MG/ML INJECTION
25 mg/mL | Freq: Once | INTRAMUSCULAR | Status: DC | PRN
Start: 2014-01-26 — End: 2014-01-26

## 2014-01-26 MED ORDER — TRAMADOL 50 MG TAB
50 mg | ORAL_TABLET | Freq: Four times a day (QID) | ORAL | Status: DC | PRN
Start: 2014-01-26 — End: 2014-10-26

## 2014-01-26 MED ORDER — LACTATED RINGERS IV
INTRAVENOUS | Status: DC
Start: 2014-01-26 — End: 2014-01-26

## 2014-01-26 MED ORDER — GLYCOPYRROLATE 0.2 MG/ML IJ SOLN
0.2 mg/mL | INTRAMUSCULAR | Status: AC
Start: 2014-01-26 — End: ?

## 2014-01-26 MED ORDER — LIDOCAINE (PF) 20 MG/ML (2 %) IJ SOLN
20 mg/mL (2 %) | INTRAMUSCULAR | Status: DC | PRN
Start: 2014-01-26 — End: 2014-01-26
  Administered 2014-01-26: 14:00:00 via INTRAVENOUS

## 2014-01-26 MED ORDER — DEXAMETHASONE SODIUM PHOSPHATE 4 MG/ML IJ SOLN
4 mg/mL | INTRAMUSCULAR | Status: DC | PRN
Start: 2014-01-26 — End: 2014-01-26
  Administered 2014-01-26: 15:00:00 via INTRAVENOUS

## 2014-01-26 MED ORDER — GLYCOPYRROLATE 0.2 MG/ML IJ SOLN
0.2 mg/mL | INTRAMUSCULAR | Status: DC | PRN
Start: 2014-01-26 — End: 2014-01-26
  Administered 2014-01-26: 15:00:00 via INTRAVENOUS

## 2014-01-26 MED ORDER — KETOROLAC TROMETHAMINE 30 MG/ML INJECTION
30 mg/mL (1 mL) | INTRAMUSCULAR | Status: DC | PRN
Start: 2014-01-26 — End: 2014-01-26
  Administered 2014-01-26: 15:00:00 via INTRAVENOUS

## 2014-01-26 MED ORDER — BUPIVACAINE-EPINEPHRINE (PF) 0.25 %-1:200,000 IJ SOLN
0.25 %-1:200,000 | INTRAMUSCULAR | Status: DC | PRN
Start: 2014-01-26 — End: 2014-01-26
  Administered 2014-01-26: 15:00:00 via SUBCUTANEOUS

## 2014-01-26 MED ORDER — ALBUTEROL SULFATE 0.083 % (0.83 MG/ML) SOLN FOR INHALATION
2.5 mg /3 mL (0.083 %) | RESPIRATORY_TRACT | Status: DC | PRN
Start: 2014-01-26 — End: 2014-01-26

## 2014-01-26 MED ORDER — ONDANSETRON (PF) 4 MG/2 ML INJECTION
4 mg/2 mL | INTRAMUSCULAR | Status: DC | PRN
Start: 2014-01-26 — End: 2014-01-26
  Administered 2014-01-26: 15:00:00 via INTRAVENOUS

## 2014-01-26 MED ORDER — PROPOFOL 10 MG/ML IV EMUL
10 mg/mL | INTRAVENOUS | Status: AC
Start: 2014-01-26 — End: ?

## 2014-01-26 MED ORDER — MIDAZOLAM 1 MG/ML IJ SOLN
1 mg/mL | Freq: Once | INTRAMUSCULAR | Status: DC
Start: 2014-01-26 — End: 2014-01-26

## 2014-01-26 MED ORDER — SODIUM CHLORIDE 0.9 % IJ SYRG
INTRAMUSCULAR | Status: DC | PRN
Start: 2014-01-26 — End: 2014-01-26

## 2014-01-26 MED ORDER — FENTANYL CITRATE (PF) 50 MCG/ML IJ SOLN
50 mcg/mL | INTRAMUSCULAR | Status: AC
Start: 2014-01-26 — End: ?

## 2014-01-26 MED ORDER — DEXAMETHASONE SODIUM PHOSPHATE 4 MG/ML IJ SOLN
4 mg/mL | INTRAMUSCULAR | Status: AC
Start: 2014-01-26 — End: ?

## 2014-01-26 MED ORDER — ONDANSETRON (PF) 4 MG/2 ML INJECTION
4 mg/2 mL | Freq: Once | INTRAMUSCULAR | Status: DC | PRN
Start: 2014-01-26 — End: 2014-01-26

## 2014-01-26 MED ORDER — FENTANYL CITRATE (PF) 50 MCG/ML IJ SOLN
50 mcg/mL | Freq: Once | INTRAMUSCULAR | Status: DC
Start: 2014-01-26 — End: 2014-01-26

## 2014-01-26 MED ORDER — ONDANSETRON 8 MG TAB, RAPID DISSOLVE
8 mg | ORAL_TABLET | Freq: Three times a day (TID) | ORAL | Status: DC | PRN
Start: 2014-01-26 — End: 2014-04-05

## 2014-01-26 MED ORDER — MIDAZOLAM 1 MG/ML IJ SOLN
1 mg/mL | INTRAMUSCULAR | Status: AC
Start: 2014-01-26 — End: 2014-01-26

## 2014-01-26 MED ORDER — BUPIVACAINE-EPINEPHRINE (PF) 0.25 %-1:200,000 IJ SOLN
0.25 %-1:200,000 | INTRAMUSCULAR | Status: AC
Start: 2014-01-26 — End: ?

## 2014-01-26 MED ORDER — FENTANYL CITRATE (PF) 50 MCG/ML IJ SOLN
50 mcg/mL | INTRAMUSCULAR | Status: DC | PRN
Start: 2014-01-26 — End: 2014-01-26
  Administered 2014-01-26 (×5): via INTRAVENOUS

## 2014-01-26 MED ORDER — LACTATED RINGERS IV
INTRAVENOUS | Status: DC
Start: 2014-01-26 — End: 2014-01-26
  Administered 2014-01-26 (×2): via INTRAVENOUS

## 2014-01-26 MED ORDER — NEOSTIGMINE METHYLSULFATE 1 MG/ML INJECTION
1 mg/mL | INTRAMUSCULAR | Status: AC
Start: 2014-01-26 — End: ?

## 2014-01-26 MED ORDER — CEFAZOLIN 2 GRAM/50 ML NS IVPB
INTRAVENOUS | Status: AC
Start: 2014-01-26 — End: 2014-01-26

## 2014-01-26 MED ORDER — PROPOFOL 10 MG/ML IV EMUL
10 mg/mL | INTRAVENOUS | Status: DC | PRN
Start: 2014-01-26 — End: 2014-01-26
  Administered 2014-01-26: 14:00:00 via INTRAVENOUS

## 2014-01-26 MED ORDER — SODIUM CHLORIDE 0.9 % IJ SYRG
Freq: Three times a day (TID) | INTRAMUSCULAR | Status: DC
Start: 2014-01-26 — End: 2014-01-26

## 2014-01-26 MED FILL — LIDOCAINE (PF) 20 MG/ML (2 %) IV SYRINGE: 100 mg/5 mL (2 %) | INTRAVENOUS | Qty: 5

## 2014-01-26 MED FILL — KETOROLAC TROMETHAMINE 15 MG/ML INJECTION: 15 mg/mL | INTRAMUSCULAR | Qty: 2

## 2014-01-26 MED FILL — DEXAMETHASONE SODIUM PHOSPHATE 4 MG/ML IJ SOLN: 4 mg/mL | INTRAMUSCULAR | Qty: 1

## 2014-01-26 MED FILL — NEOSTIGMINE METHYLSULFATE 1 MG/ML INJECTION: 1 mg/mL | INTRAMUSCULAR | Qty: 10

## 2014-01-26 MED FILL — GLYCOPYRROLATE 0.2 MG/ML IJ SOLN: 0.2 mg/mL | INTRAMUSCULAR | Qty: 0.4

## 2014-01-26 MED FILL — FENTANYL CITRATE (PF) 50 MCG/ML IJ SOLN: 50 mcg/mL | INTRAMUSCULAR | Qty: 2

## 2014-01-26 MED FILL — ROCURONIUM 10 MG/ML IV: 10 mg/mL | INTRAVENOUS | Qty: 5

## 2014-01-26 MED FILL — ONDANSETRON (PF) 4 MG/2 ML INJECTION: 4 mg/2 mL | INTRAMUSCULAR | Qty: 2

## 2014-01-26 MED FILL — BUPIVACAINE-EPINEPHRINE (PF) 0.25 %-1:200,000 IJ SOLN: 0.25 %-1:200,000 | INTRAMUSCULAR | Qty: 30

## 2014-01-26 MED FILL — MIDAZOLAM 1 MG/ML IJ SOLN: 1 mg/mL | INTRAMUSCULAR | Qty: 2

## 2014-01-26 MED FILL — LIDOCAINE (PF) 20 MG/ML (2 %) IJ SOLN: 20 mg/mL (2 %) | INTRAMUSCULAR | Qty: 60

## 2014-01-26 MED FILL — HYDROCODONE-ACETAMINOPHEN 5 MG-325 MG TAB: 5-325 mg | ORAL | Qty: 1

## 2014-01-26 MED FILL — NEOSTIGMINE METHYLSULFATE 1 MG/ML INJECTION: 1 mg/mL | INTRAMUSCULAR | Qty: 3

## 2014-01-26 MED FILL — ONDANSETRON (PF) 4 MG/2 ML INJECTION: 4 mg/2 mL | INTRAMUSCULAR | Qty: 4

## 2014-01-26 MED FILL — GLYCOPYRROLATE 0.2 MG/ML IJ SOLN: 0.2 mg/mL | INTRAMUSCULAR | Qty: 2

## 2014-01-26 MED FILL — DEXAMETHASONE SODIUM PHOSPHATE 4 MG/ML IJ SOLN: 4 mg/mL | INTRAMUSCULAR | Qty: 4

## 2014-01-26 MED FILL — ROCURONIUM 10 MG/ML IV: 10 mg/mL | INTRAVENOUS | Qty: 50

## 2014-01-26 MED FILL — KETOROLAC TROMETHAMINE 30 MG/ML INJECTION: 30 mg/mL (1 mL) | INTRAMUSCULAR | Qty: 1

## 2014-01-26 MED FILL — CEFAZOLIN 2 GRAM/50 ML NS IVPB: INTRAVENOUS | Qty: 50

## 2014-01-26 MED FILL — PROPOFOL 10 MG/ML IV EMUL: 10 mg/mL | INTRAVENOUS | Qty: 150

## 2014-01-26 MED FILL — PROPOFOL 10 MG/ML IV EMUL: 10 mg/mL | INTRAVENOUS | Qty: 20

## 2014-01-26 NOTE — Op Note (Signed)
[facility]  [address]  [place], [place] [id number]  [phone]    OPERATVE REPORT    Name: Carla Patterson     Date of Surgery: 01/26/2014  Med Record Number: [id number]   Age: 34 y.o.   Sex: female   Preoperative Diagnosis: Calculus of gallbladder with acute cholecystitis without obstruction [K80.00]  Postoperative Diagnosis: Calculus of gallbladder with chronic cholecystitis without obstruction????   Procedure(s):  CHOLECYSTECTOMY LAPAROSCOPIC  Surgeon(s) and Role:  ???? * Huntley DecJohn S Taylore Hinde, MD - Primary  Anesthesia: General ??  Estimated Blood Loss: <5 cc  Specimens: ??  ID?? Type?? Source?? Tests?? Collected by?? Time?? Destination??   1 : gallbladder?? Preservative?? Gallbladder?? ?? Huntley DecJohn S Ebbie Cherry, MD?? 01/26/2014 0927?? Pathology??   ??  Findings: no obvious endometriosis in upper abdomen or visible peritoneal cavity; pelvis not examined.?? Non dilated GB with obvious cholesterolosis seen through GB wall.?? Chronic inflamation at cystic duct common duct area with mildly enlarged cystic duct that adequately occluded with 4 x L clips completely across duct.?? Cystic duct possibly coming off RHD or at least very high on CHD.?? No accessory ducts seen.?? No bile leak.?? GB opened on back table with single duct, extensive cholesterolosis and solitary, polished ~1 cm stone.   Complications: none apparent  Implants: * No implants in log *      Procedure Description:   The risks, benefits, potential complications, treatment options, and expected outcomes were discussed with the patient pre-operatively.  The patient voiced understanding and gave informed consent preoperatively.  The patient was taken to the Operating Room, and the [facility]-out [facility] checklist was followed.     After the induction of adequate anesthesia, the abdomen was prepped and draped in the usual sterile fashion.  Preoperative antibiotics were given.  A supraumbilical incision was made and a cut down approach was used to place a balloon tip  Hasson trocar under direct vision.  After adequate pneumoperitioneum, two 5mm static and dynamic retraction ports were placed and an 11mm trocar also placed, all in the usual locations.  The peritoneal cavity was examined as her symptoms were atypical and she has a long history of endometriosis.  There was no changes c/w endometriosis in the upper abdomen or in the visualized peritoneum.  The pelvis was not examined.  The gallbladder was retracted and inflammatory adhesions to the inferior aspect of the gallbladder were taken down.  There was considerable chronic inflammation present between the cystic duct, the GB and the CHD.  Careful and tedious dissection was performed to free up the cystic duct and artery from the surrounding tissues.  A clear window was created between the inferior aspect of the gallbladder wall, the cystic duct, and the cystic artery.  The cystic duct could clearly be seen to enter the gallbladder and the cystic artery seen to traverse on the gallbladder wall toward the fundus of the gallbladder.  We then placed 2 clips on the proximal cystic artery and 2 clips on the distal cystic artery.  The artery was then divided which allowed for greater length on the cystic duct.  The cystic artery could be seen to pulsate at its clipped end as usual.  We placed 4 L clips on the distal cystic duct (patient side) and 2 clips on the proximal (specimen side) of the cystic duct.  The duct was thickened and chronically inflamed and appeared to come off high on the CHD or perhaps even the RHD.  Hepatic  structures were preserved and no bile leak was identified.  The gallbladder was then removed from its attachments to the liver.  The gallbladder was placed in an endocatch bag and retracted out through the umbilical trocar site with the camera placed temporarily through the epigastric trocar site.  The gallbladder fossa was inspected.  No bile leaks were seen, the clips on the artery and duct were intact and  hemostatsis confirmed.  Marcaine was infiltrated into the preperitoneal tissues around each trocar site.  The fascia of the umbilical incision was closed with interrupted 0 vicryl suture. GB opened on back table with single duct, extensive cholesterolosis and solitary, polished ~1 cm stone.  Subcuticular 4-0 vicryl was used to close the skin incisions.  The wounds were dressed sterilely with steristrips, 2x2s and tegaderm.  All sponge, instruments, and needle counts were correct.  The patient was taken to recovery in good condition.    Huntley Dec, MD, FACS

## 2014-01-26 NOTE — Op Note (Signed)
[facility]  [address]  [place], [place] [id number]  [phone]    OPERATVE REPORT    Name: Carla Patterson     Date of Surgery: 01/26/2014  Med Record Number: [id number]   Age: 34 y.o.   Sex: female   Preoperative Diagnosis: Calculus of gallbladder with acute cholecystitis without obstruction [K80.00]  Postoperative Diagnosis: Calculus of gallbladder with chronic cholecystitis without obstruction????   Procedure(s):  CHOLECYSTECTOMY LAPAROSCOPIC  Surgeon(s) and Role:  ???? * Huntley DecJohn S Elbony Mcclimans, MD - Primary  Anesthesia: General ??  Estimated Blood Loss: <5 cc  Specimens: ??  ID?? Type?? Source?? Tests?? Collected by?? Time?? Destination??   1 : gallbladder?? Preservative?? Gallbladder?? ?? Huntley DecJohn S Diane Hanel, MD?? 01/26/2014 0927?? Pathology??   ??  Findings: no obvious endometriosis in upper abdomen or visible peritoneal cavity; pelvis not examined.?? Non dilated GB with obvious cholesterolosis seen through GB wall.?? Chronic inflamation at cystic duct common duct area with mildly enlarged cystic duct that adequately occluded with 4 x L clips completely across duct.?? Cystic duct possibly coming off RHD or at least very high on CHD.?? No accessory ducts seen.?? No bile leak.?? GB opened on back table with single duct, extensive cholesterolosis and solitary, polished ~1 cm stone.   Complications: none apparent  Implants: * No implants in log *      Procedure Description:   The risks, benefits, potential complications, treatment options, and expected outcomes were discussed with the patient pre-operatively.  The patient voiced understanding and gave informed consent preoperatively.  The patient was taken to the Operating Room, and the [facility] time-out protocol checklist was followed.     After the induction of adequate anesthesia, the abdomen was prepped and draped in the usual sterile fashion.  Preoperative antibiotics were given.  A supraumbilical incision was made and a cut down approach was used to  place a balloon tip Hasson trocar under direct vision.  After adequate pneumoperitioneum, two 5mm static and dynamic retraction ports were placed and an 11mm trocar also placed, all in the usual locations.  The peritoneal cavity was examined as her symptoms were atypical and she has a long history of endometriosis.  There was no changes c/w endometriosis in the upper abdomen or in the visualized peritoneum.  The pelvis was not examined.  The gallbladder was retracted and inflammatory adhesions to the inferior aspect of the gallbladder were taken down.  There was considerable chronic inflammation present between the cystic duct, the GB and the CHD.  Careful and tedious dissection was performed to free up the cystic duct and artery from the surrounding tissues.  A clear window was created between the inferior aspect of the gallbladder wall, the cystic duct, and the cystic artery.  The cystic duct could clearly be seen to enter the gallbladder and the cystic artery seen to traverse on the gallbladder wall toward the fundus of the gallbladder.  We then placed 2 clips on the proximal cystic artery and 2 clips on the distal cystic artery.  The artery was then divided which allowed for greater length on the cystic duct.  The cystic artery could be seen to pulsate at its clipped end as usual.  We placed 4 L clips on the distal cystic duct (patient side) and 2 clips on the proximal (specimen side) of the cystic duct.  The duct was thickened and chronically inflamed and appeared to come off high on the CHD or perhaps even the RHD.  Hepatic  structures were preserved and no bile leak was identified.  The gallbladder was then removed from its attachments to the liver.  The gallbladder was placed in an endocatch bag and retracted out through the umbilical trocar site with the camera placed temporarily through the epigastric trocar site.  The gallbladder fossa was inspected.  No bile leaks were seen, the clips on  the artery and duct were intact and hemostatsis confirmed.  Marcaine was infiltrated into the preperitoneal tissues around each trocar site.  The fascia of the umbilical incision was closed with interrupted 0 vicryl suture. GB opened on back table with single duct, extensive cholesterolosis and solitary, polished ~1 cm stone.  Subcuticular 4-0 vicryl was used to close the skin incisions.  The wounds were dressed sterilely with steristrips, 2x2s and tegaderm.  All sponge, instruments, and needle counts were correct.  The patient was taken to recovery in good condition.    Huntley Dec, MD, FACS

## 2014-01-26 NOTE — Anesthesia Pre-Procedure Evaluation (Signed)
Anesthetic History     PONV          Review of Systems / Medical History  Patient summary reviewed, nursing notes reviewed and pertinent labs reviewed    Pulmonary  Within defined limits                 Neuro/Psych         Psychiatric history     Cardiovascular            Dysrhythmias (LBBB)       Exercise tolerance: >4 METS     GI/Hepatic/Renal  Within defined limits              Endo/Other      Hypothyroidism: well controlled  Obesity     Other Findings              Physical Exam    Airway  Mallampati: III      Mouth opening: Diminished (comment)     Cardiovascular  Regular rate and rhythm,  S1 and S2 normal,  no murmur, click, rub, or gallop             Dental  No notable dental hx       Pulmonary  Breath sounds clear to auscultation               Abdominal         Other Findings            Anesthetic Plan    ASA: 2  Anesthesia type: general          Induction: Intravenous  Anesthetic plan and risks discussed with: Patient

## 2014-01-26 NOTE — Brief Op Note (Signed)
BRIEF OPERATIVE NOTE    Date of Procedure: 01/26/2014   Preoperative Diagnosis: Calculus of gallbladder with acute cholecystitis without obstruction [K80.00]  Postoperative Diagnosis: Calculus of gallbladder with chronic cholecystitis without obstruction     Procedure(s):  CHOLECYSTECTOMY LAPAROSCOPIC  Surgeon(s) and Role:     * Huntley DecJohn S Miklo Aken, MD - Primary  Anesthesia: General   Estimated Blood Loss: <5 cc  Specimens:   ID Type Source Tests Collected by Time Destination   1 : gallbladder Preservative Gallbladder  Huntley DecJohn S Avaneesh Pepitone, MD 01/26/2014 [phone] Pathology      Findings: no obvious endometriosis in upper abdomen or visible peritoneal cavity; pelvis not examined.  Non dilated GB with obvious cholesterolosis seen through GB wall.  Chronic inflamation at cystic duct common duct area with mildly enlarged cystic duct that adequately occluded with 4 x L clips completely across duct.  Cystic duct possibly coming off RHD or at least very high on CHD.  No accessory ducts seen.  No bile leak.  GB opened on back table with single duct, extensive cholesterolosis and solitary, polished ~1 cm stone.   Complications: none apparent  Implants: * No implants in log *    Huntley DecJohn S Aws Shere, MD

## 2014-01-26 NOTE — H&P (Signed)
[name]  [address], [address]  [place] [address]  [phone]    H&P Note   [name]   MRN: [id number]     DOB: 08/30/1980        HPI: Carla Patterson is a 34 y.o. female who is referred by Dr. Rockney Ghee for cholelithiasis found on U/S in work up for nausea and abdominal pain.  She has also had some vomiting.  She was without symptoms 2 months ago.  There appears to be a relationship with meals but also unrelated.  She is also complaining of constipation and incomplete evacuation feeling.  She had normal blood work in September, but has had no repeat labs since her symptoms.  She does suffer from endometriosis and polycystic ovaries.  She is on thyroid replacement.  She has had no abdominal surgery.  She is Jehovah's witness and refuses blood transfusions.    Past Medical History   Diagnosis Date   ??? 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 Nyra Market   ??? Obese      bmi- 29   ??? 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 Cholelithiasis    ??? PCOS (polycystic ovarian syndrome)    ??? Nausea & vomiting age 74     per pt-- with wisdom teeth was given phenergan and had no problem   ??? Refusal of blood transfusions as patient is Jehovah's Witness    ??? Insomnia      Past Surgical History   Procedure Laterality Date   ??? Hx wisdom teeth extraction     ??? Hx heent       laser eye surgery age 66     No current facility-administered medications for this encounter.     Current Outpatient Prescriptions   Medication Sig   ??? levothyroxine (SYNTHROID) 100 mcg tablet Take 100 mcg by mouth Daily (before breakfast). Indications: HASHIMOTO THYROIDITIS   ??? temazepam (RESTORIL) 30 mg capsule Take 30 mg by mouth nightly.   ??? traMADol (ULTRAM) 50 mg tablet Take 50 mg by mouth every six (6) hours as needed for Pain.   ??? ondansetron hcl (ZOFRAN) 8 mg tablet Take 8 mg by mouth every eight (8) hours as needed for Nausea.   ??? L-Norgest&E Estradiol-E Estrad (DAYSEE) 0.15 mg-30 mcg (84)/10 mcg (7)  3MPk Take 1 Tab by mouth nightly.     ALLERGIES:  Biaxin and Wheat    History     Social History   ??? Marital Status: SINGLE     Spouse Name: N/A     Number of Children: N/A   ??? Years of Education: N/A     Social History Main Topics   ??? Smoking status: Never Smoker    ??? Smokeless tobacco: Never Used   ??? Alcohol Use: 0.0 oz/week     0 Not specified per week      Comment: occ   ??? Drug Use: No   ??? Sexual Activity: Not on file     Other Topics Concern   ??? Not on file     Social History Narrative     History   Smoking status   ??? Never Smoker    Smokeless tobacco   ??? Never Used     Family History   Problem Relation Age of Onset   ??? Suicide Mother    ??? Cancer Father        ROS:  The patient has no difficulty with chest pain or shortness of breath.  No fever or chills.  The patient denies any personal or family history of abnormal clotting or bleeding.  Comprehensive review of systems was otherwise unremarkable except as noted above.    Physical Exam:   Constitutional: Alert oriented cooperative patient in no acute distress.   BP 140/88 mmHg   Pulse 77   Ht '4\' 11"'  (1.499 m)   Wt 164 lb (74.39 kg)   BMI 33.11 kg/m2  Eyes:Sclera are clear without icterus.   ENMT: no obvious neck masses, no ear or lip lesions  CV: RRR. Normal perfusion  Resp: No JVD.  Breathing is  non-labored.    GI: obese, soft and non-distended, nontender and without mass    Musculoskeletal: unremarkable with normal function.   Neuro:  No obvious focal deficits  Psychiatric: normal affect and mood, no memory impairment    Lab Results   Component Value Date/Time    WBC 9.6 01/11/2014 11:24 AM    HGB 13.8 01/11/2014 11:24 AM    HCT 40.8 01/11/2014 11:24 AM    PLATELET 446 01/11/2014 11:24 AM    MCV 86 01/11/2014 11:24 AM       Lab Results   Component Value Date/Time    SODIUM 140 01/11/2014 11:24 AM    POTASSIUM 4.5 01/11/2014 11:24 AM    CHLORIDE 102 01/11/2014 11:24 AM    CO2 23 01/11/2014 11:24 AM    BUN 11 01/11/2014 11:24 AM     CREATININE 0.86 01/11/2014 11:24 AM    GLUCOSE 85 01/11/2014 11:24 AM    BILIRUBIN, TOTAL 0.2 01/11/2014 11:24 AM    AST 18 01/11/2014 11:24 AM    ALK. PHOSPHATASE 73 01/11/2014 11:24 AM    AMYLASE 43 01/11/2014 11:24 AM    LIPASE 25 01/11/2014 11:24 AM     ABDOMINAL ULTRASOUND 12/22/2013  HISTORY: nausea with vomiting for three weeks, chronic abdominal pain ;   ;   TECHNIQUE: Sonographic imaging of the abdomen was performed.  COMPARISON: None  FINDINGS: The pancreatic head is normal. There is no intrahepatic biliary ductal  dilatation. The liver parenchyma is normal in appearance.  The gallbladder is well distended and contains an echogenic shadowing  intraluminal stone. There is no gallbladder wall thickening or pericholecystic  fluid. The sonographic Murphy's sign is absent. The common bile duct is 4 mm in  diameter.   The right kidney is 9.6cm in length. The left kidney is 9.7 cm in length. There  is no hydronephrosis. The spleen measures 10.1 cm in length. The distal aorta is  1.4cm in diameter. The IVC is patent.  IMPRESSION: Cholelithiasis.    Assessment/Plan:     Carla Patterson is a 34 y.o. female who has signs and symptoms consistent with cholelithiasis and nausea, vomiting and abdominal pain that is intermittent.  She was well 2 months ago.  It is certainly possible that she has chronic cholecystitis and may benefit from cholecystectomy.  Her endometriosis and polycystic ovaries could possibly play a role in her symptoms, but additional testing would not exclude a GB cause.  Cholecystectomy is advised.   I discussed the patient's condition and treatment options with the patient.  I discussed risks of surgery in language the patient could understand including bleeding, infection, bile leak, retained stones, continued symptoms, need for further surgery or endoscopy, abscess, fistula, SBO, DVT, PE, heart attack, stroke, renal failure, respiratory failure, ventilatory dependence, and death.  The  patient voiced understanding of all this and all questions were answered.  Alternatives to surgery were discussed also and risks of the alternatives.  The patient requested that we proceed with surgery.  Informed consent was obtained.     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

## 2014-01-26 NOTE — Progress Notes (Signed)
[name]  [address], [address]  [place] [address]  [phone]    Office Note/Consult Note   Carla Patterson   MRN: [id number]     DOB: 02/05/1980        HPI: Carla Patterson is a 34 y.o. female who is referred by Dr. Rockney Ghee for cholelithiasis found on U/S in work up for nausea and abdominal pain.  She has also had some vomiting.  She was without symptoms 2 months ago.  There appears to be a relationship with meals but also unrelated.  She is also complaining of constipation and incomplete evacuation feeling.  She had normal blood work in September, but has had no repeat labs since her symptoms.  She does suffer from endometriosis and polycystic ovaries.  She is on thyroid replacement.  She has had no abdominal surgery.  She is Jehovah's witness and refuses blood transfusions.    Past Medical History   Diagnosis Date   ??? 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 Nyra Market   ??? Obese      bmi- 31   ??? 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 Cholelithiasis    ??? PCOS (polycystic ovarian syndrome)    ??? Nausea & vomiting age 67     per pt-- with wisdom teeth was given phenergan and had no problem   ??? Refusal of blood transfusions as patient is Jehovah's Witness    ??? Insomnia      Past Surgical History   Procedure Laterality Date   ??? Hx wisdom teeth extraction     ??? Hx heent       laser eye surgery age 51     Current Outpatient Prescriptions   Medication Sig   ??? L-Norgest&E Estradiol-E Estrad (DAYSEE) 0.15 mg-30 mcg (84)/10 mcg (7) 3MPk Take 1 Tab by mouth nightly.   ??? levothyroxine (SYNTHROID) 100 mcg tablet Take 100 mcg by mouth Daily (before breakfast). Indications: HASHIMOTO THYROIDITIS   ??? temazepam (RESTORIL) 30 mg capsule Take 30 mg by mouth nightly.   ??? traMADol (ULTRAM) 50 mg tablet Take 50 mg by mouth every six (6) hours as needed for Pain.   ??? ondansetron hcl (ZOFRAN) 8 mg tablet Take 8 mg by mouth every eight (8) hours as needed for Nausea.      No current facility-administered medications for this visit.     ALLERGIES:  Biaxin and Wheat    History     Social History   ??? Marital Status: SINGLE     Spouse Name: N/A     Number of Children: N/A   ??? Years of Education: N/A     Social History Main Topics   ??? Smoking status: Never Smoker    ??? Smokeless tobacco: Never Used   ??? Alcohol Use: 0.0 oz/week     0 Not specified per week      Comment: occ   ??? Drug Use: No   ??? Sexual Activity: Not on file     Other Topics Concern   ??? Not on file     Social History Narrative     History   Smoking status   ??? Never Smoker    Smokeless tobacco   ??? Never Used     Family History   Problem Relation Age of Onset   ??? Suicide Mother    ??? Cancer Father  ROS: The patient has no difficulty with chest pain or shortness of breath.  No fever or chills.  The patient denies any personal or family history of abnormal clotting or bleeding.  Comprehensive review of systems was otherwise unremarkable except as noted above.    Physical Exam:   Constitutional: Alert oriented cooperative patient in no acute distress.   BP 140/88 mmHg   Pulse 77   Ht '4\' 11"'  (1.499 m)   Wt 164 lb (74.39 kg)   BMI 33.11 kg/m2  Eyes:Sclera are clear without icterus.   ENMT: no obvious neck masses, no ear or lip lesions  CV: RRR. Normal perfusion  Resp: No JVD.  Breathing is  non-labored.    GI: obese, soft and non-distended, nontender and without mass    Musculoskeletal: unremarkable with normal function.   Neuro:  No obvious focal deficits  Psychiatric: normal affect and mood, no memory impairment    Lab Results   Component Value Date/Time    WBC 9.6 01/11/2014 11:24 AM    HGB 13.8 01/11/2014 11:24 AM    HCT 40.8 01/11/2014 11:24 AM    PLATELET 446 01/11/2014 11:24 AM    MCV 86 01/11/2014 11:24 AM       Lab Results   Component Value Date/Time    SODIUM 140 01/11/2014 11:24 AM    POTASSIUM 4.5 01/11/2014 11:24 AM    CHLORIDE 102 01/11/2014 11:24 AM    CO2 23 01/11/2014 11:24 AM    BUN 11 01/11/2014 11:24 AM     CREATININE 0.86 01/11/2014 11:24 AM    GLUCOSE 85 01/11/2014 11:24 AM    BILIRUBIN, TOTAL 0.2 01/11/2014 11:24 AM    AST 18 01/11/2014 11:24 AM    ALK. PHOSPHATASE 73 01/11/2014 11:24 AM    AMYLASE 43 01/11/2014 11:24 AM    LIPASE 25 01/11/2014 11:24 AM     ABDOMINAL ULTRASOUND 12/22/2013  HISTORY: nausea with vomiting for three weeks, chronic abdominal pain ;   ;   TECHNIQUE: Sonographic imaging of the abdomen was performed.  COMPARISON: None  FINDINGS: The pancreatic head is normal. There is no intrahepatic biliary ductal  dilatation. The liver parenchyma is normal in appearance.  The gallbladder is well distended and contains an echogenic shadowing  intraluminal stone. There is no gallbladder wall thickening or pericholecystic  fluid. The sonographic Murphy's sign is absent. The common bile duct is 4 mm in  diameter.   The right kidney is 9.6cm in length. The left kidney is 9.7 cm in length. There  is no hydronephrosis. The spleen measures 10.1 cm in length. The distal aorta is  1.4cm in diameter. The IVC is patent.  IMPRESSION: Cholelithiasis.    Assessment/Plan:     Carla Patterson is a 34 y.o. female who has signs and symptoms consistent with cholelithiasis and nausea, vomiting and abdominal pain that is intermittent.  She was well 2 months ago.  It is certainly possible that she has chronic cholecystitis and may benefit from cholecystectomy.  Her endometriosis and polycystic ovaries could possibly play a role in her symptoms, but additional testing would not exclude a GB cause.  Cholecystectomy is advised.   I discussed the patient's condition and treatment options with the patient.  I discussed risks of surgery in language the patient could understand including bleeding, infection, bile leak, retained stones, continued symptoms, need for further surgery or endoscopy, abscess, fistula, SBO, DVT, PE, heart attack, stroke, renal failure, respiratory failure, ventilatory dependence, and death.  The  patient voiced understanding of all this and all questions were answered.  Alternatives to surgery were discussed also and risks of the alternatives.  The patient requested that we proceed with surgery.  Informed consent was obtained. 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

## 2014-01-26 NOTE — Addendum Note (Signed)
Addendum  created 01/26/14 1228 by Vanetta ShawlJames D Elonda Giuliano, CRNA    Modules edited: Anesthesia Flowsheet

## 2014-01-26 NOTE — Anesthesia Post-Procedure Evaluation (Signed)
Post-Anesthesia Evaluation and Assessment    Patient: Carla Patterson MRN: [id number]  SSN: [id number]    Date of Birth: 01/03/1981  Age: 34 y.o.  Sex: female       Cardiovascular Function/Vital Signs  Visit Vitals   Item Reading   ??? BP 118/57 mmHg   ??? Pulse 93   ??? Temp 37 ??C (98.6 ??F)   ??? Resp 16   ??? Ht 5' (1.524 m)   ??? Wt 73.936 kg (163 lb)   ??? BMI 31.83 kg/m2   ??? SpO2 100%       Patient is status post general anesthesia for Procedure(s):  CHOLECYSTECTOMY LAPAROSCOPIC.    Nausea/Vomiting: None    Postoperative hydration reviewed and adequate.    Pain:  Pain Scale 1: Numeric (0 - 10) (01/26/14 1024)  Pain Intensity 1: 0 (01/26/14 1024)   Managed    Neurological Status:   Neuro (WDL): Exceptions to WDL (01/26/14 1024)  Neuro  LUE Motor Response: Purposeful (01/26/14 1024)  LLE Motor Response: Purposeful (01/26/14 1024)  RUE Motor Response: Purposeful (01/26/14 1024)  RLE Motor Response: Purposeful (01/26/14 1024)   At baseline    Mental Status and Level of Consciousness: Alert and oriented     Pulmonary Status:   O2 Device: Nasal cannula (01/26/14 1024)   Adequate oxygenation and airway patent    Complications related to anesthesia: None    Post-anesthesia assessment completed. No concerns    Signed By: Jeannene PatellaSTEVEN J Tiquan Bouch, MD     January 26, 2014

## 2014-02-08 ENCOUNTER — Ambulatory Visit
Admit: 2014-02-08 | Discharge: 2014-02-08 | Payer: BLUE CROSS/BLUE SHIELD | Attending: Surgery | Primary: Family Medicine

## 2014-02-08 DIAGNOSIS — K8020 Calculus of gallbladder without cholecystitis without obstruction: Secondary | ICD-10-CM

## 2014-02-08 NOTE — Patient Instructions (Signed)
[name]  [address], [address]  [place] [address]  [phone]    CARE INSTRUCTIONS  Your surgeon has given you verbal instructions regarding your plan of care today.    FOLLOW-UP AFTER A TEST OR STUDY  If you are having labs drawn or a radiologic study, there will be a follow-up appt needed after that, which either has already been made for you today or will need to be made by you to review the results of your study or test (unless special arrangements were made today for you by your surgeon).      We are unable to review results over the phone because those results invariably lead to questions that you and your surgeon need to make together during an appt.  Therefore, please do not call the office for test results as they should be discussed at your next appt, and if you don't have a next appt, please call and get one after your test is completed, or as you were instructed.    AFTER-HOURS CALLS  We have a surgeon-call for emergencies 24 hrs a day.  After the office is closed, which is usually at 5PM and on weekends and holidays, the on-call surgeon can be reached by dialing our office number ([phone]).    Please do not call unless you are having an urgent or emergent issue.  The on-call surgeon cannot make appt changes or call in refills for pain medications after the office is closed.

## 2014-02-08 NOTE — Progress Notes (Signed)
[name]  [address]  [place] [id number]  [phone]    Carla Americanmanda E Gamarra is a 34 y.o. female who is seen in the office 2 weeks post-op after laparoscopic cholecystectomy for cholelithiasis with epigastric pain and nausea.  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  She has done very well and has had resolution of her symptoms.  She is having no difficulty with eating, urinating, or defecation.  She is having no further pain and is not taking any pain medication.  She is having no abnormal GI reactions to food.  We discussed her operative findings and pathology report.  On exam her trocar sites are healing nicely, but she does have a fairly typical appearing rash underneath the Steri-Strips that is consistent with a contact dermatitis from the adhesive.  She does report that she has had difficulty with adhesions in the past.  We put a thin coat of hydrocortisone cream on them today in the office.  She does complain that these do itch a little bit as well.  I have recommended that she buy some over-the-counter hydrocortisone cream and apply this twice a day for up to 2 weeks.  She may also take some oral Benadryl or use in Benadryl cream to help with the itching as well.  She understands that she is restricted from lifting more than 10 pounds for an additional 4 weeks.  She will call us if she has no resolution of the skin rash.  We will see her back on an as-needed basis.        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

## 2014-02-15 NOTE — Other (Signed)
I have retrospectively accessed this chart for auditing purposes only.

## 2014-04-05 ENCOUNTER — Ambulatory Visit
Admit: 2014-04-05 | Discharge: 2014-04-05 | Payer: BLUE CROSS/BLUE SHIELD | Attending: Family Medicine | Primary: Family Medicine

## 2014-04-05 DIAGNOSIS — F5101 Primary insomnia: Secondary | ICD-10-CM

## 2014-04-05 MED ORDER — TEMAZEPAM 30 MG CAP
30 mg | ORAL_CAPSULE | Freq: Every evening | ORAL | Status: DC
Start: 2014-04-05 — End: 2014-10-08

## 2014-04-05 NOTE — Progress Notes (Signed)
[facility]  [address]  [place], [place] [id number]  Phone: [phone]                                                                                                Teresita MaduraJennifer L Shontay Wallner, MD      CC:  Chief Complaint   Patient presents with   ??? Insomnia     refill restoril   ??? Hypothyroidism     has 1-1/2 months of med left.         HPI:    Carla Patterson is a 34 y.o. female would presents for a refill on her temazepam and follow up on her thyroid.    Hypothyroidism    Carla Patterson is a 34 y.o. female who presents today for follow up on her diagnosis of hypothyroidism.   Current symptoms: heat / cold intolerance, change in energy level, dry skin and hair.   Patient denies weight changes, palpitations, nervousness, diarrhea.   Symptoms are gradually worsening.   The patient is following treatment regimen with good compliance.    Insomnia    She is here today follow up on insomnia.    Patient has tried the following treatments without relief:   none  Patient is using the current medication:  temazepam.  she is happy with the current regimen and would like to continue it.           PAST MEDICAL HISTORY:    Allergies   Allergen Reactions   ??? Biaxin [Clarithromycin] Rash     History     Social History   ??? Marital Status: SINGLE     Spouse Name: N/A   ??? Number of Children: N/A   ??? Years of Education: N/A     Social History Main Topics   ??? Smoking status: Never Smoker    ??? Smokeless tobacco: Never Used   ??? Alcohol Use: 0.0 oz/week     0 Standard drinks or equivalent per week      Comment: occ   ??? Drug Use: No   ??? Sexual Activity: Not on file     Other Topics Concern   ??? None     Social History Narrative     Family History   Problem Relation Age of Onset   ??? Suicide Mother    ??? Cancer Father      Past Surgical History   Procedure Laterality Date   ??? Hx wisdom teeth extraction     ??? Hx heent       laser eye surgery age 89   ??? Hx cholecystectomy       Patient Active Problem List   Diagnosis Code    ??? Depression F32.9   ??? 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 Endometriosis N80.9   ??? Restless leg syndrome G25.81   ??? Heart murmur R01.1   ??? 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 Epigastric pain R10.13   ??? Nausea R11.0     Current Outpatient Prescriptions on File Prior to Visit  Medication Sig Dispense Refill   ??? traMADol (ULTRAM) 50 mg tablet Take 1 Tab by mouth every six (6) hours as needed for Pain. Max Daily Amount: 200 mg. 1 Tab 0   ??? levothyroxine (SYNTHROID) 100 mcg tablet Take 100 mcg by mouth Daily (before breakfast). Indications: HASHIMOTO THYROIDITIS     ??? L-Norgest&E Estradiol-E Estrad (DAYSEE) 0.15 mg-30 mcg (84)/10 mcg (7) Take 1 Tab by mouth nightly.     ??? HYDROcodone-acetaminophen (NORCO) 5-325 mg per tablet Take 1-2 Tabs by mouth every six (6) hours as needed for Pain. Max Daily Amount: 8 Tabs. 50 Tab 0   ??? ondansetron (ZOFRAN ODT) 8 mg disintegrating tablet Take 0.5 Tabs by mouth every eight (8) hours as needed for Nausea (and vomiting). 30 Tab 1   ??? ondansetron hcl (ZOFRAN) 8 mg tablet Take 8 mg by mouth every eight (8) hours as needed for Nausea.       No current facility-administered medications on file prior to visit.          ROS:    Review of Systems   Constitutional: Positive for malaise/fatigue. Negative for fever, chills and weight loss.   HENT: Negative for congestion and sore throat.    Eyes: Negative for blurred vision, double vision, photophobia and redness.   Respiratory: Negative for cough, shortness of breath and wheezing.    Cardiovascular: Negative for chest pain.   Gastrointestinal: Negative for nausea, vomiting, abdominal pain, diarrhea and constipation.   Genitourinary: Negative for dysuria, urgency and frequency.   Skin: Negative for itching and rash.   Neurological: Negative for dizziness, sensory change, speech change, focal weakness, seizures, loss of consciousness and headaches.    Psychiatric/Behavioral: Negative for depression. The patient is not nervous/anxious and does not have insomnia.           BP 134/86 mmHg   Pulse 99   Temp(Src) 98.4 ??F (36.9 ??C)   Ht 5' (1.524 m)   Wt 162 lb (73.483 kg)   BMI 31.64 kg/m2   SpO2 99%    PHYSICAL EXAM:    Physical Exam   Constitutional: She is oriented to person, place, and time and well-developed, well-nourished, and in no distress. No distress.   HENT:   Head: Normocephalic and atraumatic.   Right Ear: External ear normal.   Left Ear: External ear normal.   Nose: Nose normal.   Mouth/Throat: Oropharynx is clear and moist.   Eyes: Conjunctivae and EOM are normal. Pupils are equal, round, and reactive to light.   Neck: Normal range of motion. Neck supple.   Cardiovascular: Normal rate and regular rhythm.    Murmur heard.  Pulmonary/Chest: Effort normal and breath sounds normal. No respiratory distress.   Abdominal: Soft. Bowel sounds are normal. She exhibits no distension. There is no tenderness. There is no rebound.   Musculoskeletal: Normal range of motion. She exhibits no edema or tenderness.   Lymphadenopathy:     She has no cervical adenopathy.   Neurological: She is alert and oriented to person, place, and time. No cranial nerve deficit. Gait normal.   Skin: Skin is warm and dry. No rash noted. She is not diaphoretic.   Psychiatric: Mood, affect and judgment normal.   Vitals reviewed.      ASSESSMENT & PLAN:    1. Primary insomnia  Stable.  Continue current medications.  - temazepam (RESTORIL) 30 mg capsule; Take 1 Cap by mouth nightly. Max Daily Amount: 30 mg.  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  Last three checks were all different.  - TSH, 3RD GENERATION  - THYROID PEROXIDASE (TPO) AB  - THYROGLOBULIN AB

## 2014-04-06 LAB — THYROID PEROXIDASE (TPO) AB: Thyroid peroxidase Ab: 172 IU/mL — ABNORMAL HIGH (ref 0–34)

## 2014-04-06 LAB — THYROGLOBULIN AB: Thyroglobulin Ab: 2 IU/mL — ABNORMAL HIGH (ref 0.0–0.9)

## 2014-04-06 LAB — TSH 3RD GENERATION: TSH: 2.85 u[IU]/mL (ref 0.450–4.500)

## 2014-04-09 NOTE — Progress Notes (Signed)
Quick Note:        Called and left a message about normal tsh, but still mildly elevated antibodies, but they are decreasing    ______

## 2014-06-07 NOTE — Telephone Encounter (Signed)
Requested Prescriptions     Pending Prescriptions Disp Refills   ??? levothyroxine (SYNTHROID) 100 mcg tablet 30 Tab 5     Sig: Take 1 Tab by mouth Daily (before breakfast). Indications: HASHIMOTO THYROIDITIS

## 2014-06-08 MED ORDER — LEVOTHYROXINE 100 MCG TAB
100 mcg | ORAL_TABLET | Freq: Every day | ORAL | Status: DC
Start: 2014-06-08 — End: 2015-01-06

## 2014-07-21 ENCOUNTER — Encounter: Attending: Family Medicine | Primary: Family Medicine

## 2014-10-06 ENCOUNTER — Ambulatory Visit
Admit: 2014-10-06 | Discharge: 2014-10-06 | Payer: BLUE CROSS/BLUE SHIELD | Attending: Family Medicine | Primary: Family Medicine

## 2014-10-06 DIAGNOSIS — E034 Atrophy of thyroid (acquired): Secondary | ICD-10-CM

## 2014-10-06 NOTE — Patient Instructions (Addendum)
Hypothyroidism: Care Instructions  Your Care Instructions  You have hypothyroidism, which means that your body is not making enough thyroid hormone. This hormone helps your body use energy. If your thyroid level is low, you may feel tired, be constipated, have an increase in your blood pressure, or have dry skin or memory problems. You may also get cold easily, even when it is warm. Women with low thyroid levels may have heavy menstrual periods.  A blood test to find your thyroid-stimulating hormone (TSH) level is used to check for hypothyroidism. A high TSH level may mean that you have low thyroid. When your body is not making enough thyroid hormone, TSH levels rise in an effort to make the body produce more.  The treatment for hypothyroidism is to take thyroid hormone pills. You should start to feel better in 1 to 2 weeks. But it can take several months to see changes in the TSH level. You will need regular visits with your doctor to make sure you have the right dose of medicine.  Most people need treatment for the rest of their lives. You will need to see your doctor regularly to have blood tests and to make sure you are doing well.  Follow-up care is a key part of your treatment and safety. Be sure to make and go to all appointments, and call your doctor if you are having problems. It???s also a good idea to know your test results and keep a list of the medicines you take.  How can you care for yourself at home?  ?? Take your thyroid hormone medicine exactly as prescribed. Call your doctor if you think you are having a problem with your medicine. Most people do not have side effects if they take the right amount of medicine regularly.  ?? Take the medicine 30 minutes before breakfast, and do not take it with calcium, vitamins, or iron.  ?? Do not take extra doses of your thyroid medicine. It will not help you get better any faster, and it may cause side effects.   ?? If you forget to take a dose, do NOT take a double dose of medicine. Take your usual dose the next day.  ?? Tell your doctor about all prescription, herbal, or over-the-counter products you take.  ?? Take care of yourself. Eat a healthy diet, get enough sleep, and get regular exercise.  When should you call for help?  Call 911 anytime you think you may need emergency care. For example, call if:  ?? You passed out (lost consciousness).  ?? You have severe trouble breathing.  ?? You have a very slow heartbeat (less than 60 beats a minute).  ?? You have a low body temperature (95??F or below).  Call your doctor now or seek immediate medical care if:  ?? You feel tired, sluggish, or weak.  ?? You have trouble remembering things or concentrating.  ?? You do not begin to feel better 2 weeks after starting your medicine.  Watch closely for changes in your health, and be sure to contact your doctor if you have any problems.  Where can you learn more?  Go to http://www.healthwise.net/GoodHelpConnections  Enter N862 in the search box to learn more about "Hypothyroidism: Care Instructions."  ?? 2006-2016 Healthwise, Inc[employer]. Care instructions adapted under license by [employer] (which disclaims liability or warranty for this information). This care instruction is for use with your licensed healthcare professional. If you have questions about a medical condition or this instruction,   always ask your healthcare professional. Healthwise, Incorporated disclaims any warranty or liability for your use of this information.  Content Version: 10.9.538570; Current as of: December 11, 2013        Fatigue: Care Instructions  Your Care Instructions  Fatigue is a feeling of tiredness, exhaustion, or lack of energy. You may feel fatigue because of too much or not enough activity. It can also come from stress, lack of sleep, boredom, and poor diet. Many medical problems, such as viral infections, can cause fatigue. Emotional problems,  especially depression, are often the cause of fatigue.  Fatigue is most often a symptom of another problem. Treatment for fatigue depends on the cause. For example, if you have fatigue because you have a certain health problem, treating this problem also treats your fatigue. If depression or anxiety is the cause, treatment may help.  Follow-up care is a key part of your treatment and safety. Be sure to make and go to all appointments, and call your doctor if you are having problems. It's also a good idea to know your test results and keep a list of the medicines you take.  How can you care for yourself at home?  ?? Get regular exercise. But don't overdo it. Go back and forth between rest and exercise.  ?? Get plenty of rest.  ?? Eat a healthy diet. Do not skip meals, especially breakfast.  ?? Reduce your use of caffeine, tobacco, and alcohol. Caffeine is most often found in coffee, tea, cola drinks, and chocolate.  ?? Limit medicines that can cause fatigue. This includes tranquilizers and cold and allergy medicines.  When should you call for help?  Watch closely for changes in your health, and be sure to contact your doctor if:  ?? You have new symptoms such as fever or a rash.  ?? Your fatigue gets worse.  ?? You have been feeling down, depressed, or hopeless. Or you may have lost interest in things that you usually enjoy.  ?? You are not getting better as expected.  Where can you learn more?  Go to http://www.healthwise.net/GoodHelpConnections  Enter W864 in the search box to learn more about "Fatigue: Care Instructions."  ?? 2006-2016 Healthwise, Inc[employer]. Care instructions adapted under license by [employer] (which disclaims liability or warranty for this information). This care instruction is for use with your licensed healthcare professional. If you have questions about a medical condition or this instruction, always ask your healthcare professional. Healthwise,  Incorporated disclaims any warranty or liability for your use of this information.  Content Version: 10.9.538570; Current as of: December 11, 2013

## 2014-10-06 NOTE — Progress Notes (Signed)
[facility]  [address]  [place], [place] [address]  Phone: [phone]                                                                                                [name], MD      CC:  Chief Complaint   Patient presents with   ??? Hypothyroidism     wants tsh checked-- increased fatigue-- on synthroid 100 mcg         HPI:    Carla Patterson is a 34 y.o. female would presents for a recheck of her thyroid. She has had some worsening fatigue.  She wants to see if it is due to her thyroid or if it might be due to her depression.  She is considering starting depression medication if her thyroid is normal.         PAST MEDICAL HISTORY:    Allergies   Allergen Reactions   ??? Biaxin [Clarithromycin] Rash     Social History     Social History   ??? Marital status: SINGLE     Spouse name: N/A   ??? Number of children: N/A   ??? Years of education: N/A     Social History Main Topics   ??? Smoking status: Never Smoker   ??? Smokeless tobacco: Never Used   ??? Alcohol use 0.0 oz/week     0 Standard drinks or equivalent per week      Comment: occ   ??? Drug use: No   ??? Sexual activity: Not Currently     Other Topics Concern   ??? None     Social History Narrative     Family History   Problem Relation Age of Onset   ??? Suicide Mother    ??? Cancer Father      Past Surgical History   Procedure Laterality Date   ??? Hx wisdom teeth extraction     ??? Hx heent       laser eye surgery age 57   ??? Hx cholecystectomy       Patient Active Problem List   Diagnosis Code   ??? Depression F32.9   ??? 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 Endometriosis N80.9   ??? Restless leg syndrome G25.81   ??? Heart murmur R01.1   ??? 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 Epigastric pain R10.13   ??? Nausea R11.0     Current Outpatient Prescriptions on File Prior to Visit   Medication Sig Dispense Refill   ??? levothyroxine (SYNTHROID) 100 mcg tablet Take 1 Tab by mouth Daily  (before breakfast). Indications: HASHIMOTO THYROIDITIS 30 Tab 5   ??? temazepam (RESTORIL) 30 mg capsule Take 1 Cap by mouth nightly. Max Daily Amount: 30 mg. 30 Cap 5   ??? traMADol (ULTRAM) 50 mg tablet Take 1 Tab by mouth every six (6) hours as needed for Pain. Max Daily Amount: 200 mg. 1 Tab 0   ??? L-Norgest&E Estradiol-E Estrad (DAYSEE) 0.15 mg-30 mcg (84)/10 mcg (7) Take 1 Tab  by mouth nightly.       No current facility-administered medications on file prior to visit.           ROS:    Review of Systems   Constitutional: Positive for malaise/fatigue. Negative for chills, fever and weight loss.   Eyes: Negative for blurred vision.   Respiratory: Negative for shortness of breath.    Cardiovascular: Negative for chest pain, palpitations and leg swelling.   Gastrointestinal: Negative for constipation, diarrhea, nausea and vomiting.   Skin: Negative for itching and rash.   Neurological: Negative for headaches.   Psychiatric/Behavioral: Positive for depression. Negative for hallucinations. The patient has insomnia. The patient is not nervous/anxious.        Visit Vitals   ??? BP 138/88   ??? Pulse 94   ??? Ht 5' (1.524 m)   ??? Wt 165 lb (74.8 kg)   ??? SpO2 99%   ??? BMI 32.22 kg/m2       PHYSICAL EXAM:    Physical Exam   Constitutional: She is well-developed, well-nourished, and in no distress. No distress.   HENT:   Head: Normocephalic and atraumatic.   Eyes: Conjunctivae and EOM are normal. Pupils are equal, round, and reactive to light.   Neck: No thyromegaly present.   Cardiovascular: Normal rate, regular rhythm and normal heart sounds.    No murmur heard.  Pulmonary/Chest: Effort normal and breath sounds normal. No respiratory distress. She has no wheezes.   Musculoskeletal: She exhibits no edema.   Neurological: She is alert. Gait normal.   Skin: Skin is warm and dry. No rash noted. She is not diaphoretic. No erythema.   Psychiatric: Mood, affect and judgment normal.       ASSESSMENT & PLAN:     1. Hypothyroidism due to acquired atrophy of thyroid  Will recheck labs again and if antibodies are elevated, may consider referral to endocrinology.    - TSH, 3RD GENERATION  - THYROGLOBULIN AB  - THYROID PEROXIDASE (TPO) AB  - CBC WITH AUTOMATED DIFF  - METABOLIC PANEL, COMPREHENSIVE    2. Fatigue, unspecified type  Will check labs and if no significant change, may consider starting zoloft for depression.

## 2014-10-07 LAB — CBC WITH AUTOMATED DIFF
ABS. BASOPHILS: 0 10*3/uL (ref 0.0–0.2)
ABS. EOSINOPHILS: 0.1 10*3/uL (ref 0.0–0.4)
ABS. IMM. GRANS.: 0 10*3/uL (ref 0.0–0.1)
ABS. MONOCYTES: 0.5 10*3/uL (ref 0.1–0.9)
ABS. NEUTROPHILS: 7.4 10*3/uL — ABNORMAL HIGH (ref 1.4–7.0)
Abs Lymphocytes: 2 10*3/uL (ref 0.7–3.1)
BASOPHILS: 0 %
EOSINOPHILS: 1 %
HCT: 40.1 % (ref 34.0–46.6)
HGB: 14.1 g/dL (ref 11.1–15.9)
IMMATURE GRANULOCYTES: 0 %
Lymphocytes: 20 %
MCH: 30.5 pg (ref 26.6–33.0)
MCHC: 35.2 g/dL (ref 31.5–35.7)
MCV: 87 fL (ref 79–97)
MONOCYTES: 5 %
NEUTROPHILS: 74 %
PLATELET: 421 10*3/uL — ABNORMAL HIGH (ref 150–379)
RBC: 4.63 x10E6/uL (ref 3.77–5.28)
RDW: 13.1 % (ref 12.3–15.4)
WBC: 9.9 10*3/uL (ref 3.4–10.8)

## 2014-10-07 LAB — METABOLIC PANEL, COMPREHENSIVE
A-G Ratio: 1.4 (ref 1.1–2.5)
ALT (SGPT): 15 IU/L (ref 0–32)
AST (SGOT): 15 IU/L (ref 0–40)
Albumin: 3.8 g/dL (ref 3.5–5.5)
Alk. phosphatase: 78 IU/L (ref 39–117)
BUN/Creatinine ratio: 12 (ref 8–20)
BUN: 9 mg/dL (ref 6–20)
Bilirubin, total: 0.4 mg/dL (ref 0.0–1.2)
CO2: 21 mmol/L (ref 18–29)
Calcium: 9.2 mg/dL (ref 8.7–10.2)
Chloride: 103 mmol/L (ref 97–108)
Creatinine: 0.76 mg/dL (ref 0.57–1.00)
GFR est AA: 118 mL/min/{1.73_m2} (ref >59)
GFR est non-AA: 102 mL/min/{1.73_m2} (ref >59)
GLOBULIN, TOTAL: 2.7 g/dL (ref 1.5–4.5)
Glucose: 99 mg/dL (ref 65–99)
Potassium: 4.9 mmol/L (ref 3.5–5.2)
Protein, total: 6.5 g/dL (ref 6.0–8.5)
Sodium: 140 mmol/L (ref 134–144)

## 2014-10-07 LAB — THYROGLOBULIN AB: Thyroglobulin Ab: 1.8 IU/mL — ABNORMAL HIGH (ref 0.0–0.9)

## 2014-10-07 LAB — THYROID PEROXIDASE (TPO) AB: Thyroid peroxidase Ab: 154 IU/mL — ABNORMAL HIGH (ref 0–34)

## 2014-10-07 LAB — TSH 3RD GENERATION: TSH: 2.32 u[IU]/mL (ref 0.450–4.500)

## 2014-10-07 NOTE — Progress Notes (Signed)
Please let Carla Patterson know that her thyroid is normal, but her antibodies are stable, but high.  I would be happy to start her on zoloft for her depression and would be happy to refer her to endocrinology if she would like it.

## 2014-10-08 ENCOUNTER — Encounter

## 2014-10-08 MED ORDER — SERTRALINE 25 MG TAB
25 mg | ORAL_TABLET | Freq: Every day | ORAL | 0 refills | Status: DC
Start: 2014-10-08 — End: 2014-10-26

## 2014-10-08 MED ORDER — TEMAZEPAM 30 MG CAP
30 mg | ORAL_CAPSULE | Freq: Every evening | ORAL | 5 refills | Status: DC
Start: 2014-10-08 — End: 2015-04-08

## 2014-10-08 NOTE — Telephone Encounter (Signed)
Pt calls back and states that she wants Zoloft called in and also a refill of Restoril.  Adjust Zoloft as you want escribed,  Pt advised to follow up in office in 1 month. She agreed.

## 2014-10-08 NOTE — Progress Notes (Signed)
Pt notified via personal vmail. Advised to call office back if decided on either.

## 2014-10-26 ENCOUNTER — Ambulatory Visit
Admit: 2014-10-26 | Discharge: 2014-10-26 | Payer: BLUE CROSS/BLUE SHIELD | Attending: Family Medicine | Primary: Family Medicine

## 2014-10-26 DIAGNOSIS — F324 Major depressive disorder, single episode, in partial remission: Secondary | ICD-10-CM

## 2014-10-26 MED ORDER — TRAMADOL 50 MG TAB
50 mg | ORAL_TABLET | Freq: Four times a day (QID) | ORAL | 3 refills | Status: DC | PRN
Start: 2014-10-26 — End: 2015-01-11

## 2014-10-26 MED ORDER — SERTRALINE 50 MG TAB
50 mg | ORAL_TABLET | Freq: Every day | ORAL | 2 refills | Status: DC
Start: 2014-10-26 — End: 2015-01-11

## 2014-10-26 NOTE — Progress Notes (Signed)
[facility]  [address]  [place], [place] [address]  Phone: [phone]                                                                                                [name], MD      CC:  Chief Complaint   Patient presents with   ??? Depression     follow up -- on zoloft 50 mg now for 3 days-- refill         HPI:    Carla Patterson is a 34 y.o. female would presents for a follow upo n her depression.  She was started on Zoloft.  She says that she is doing better on it.  She is planning to continue it.  She is also wanting a refill on her tramadol for her abdominal pain from endometriosis.         PAST MEDICAL HISTORY:    Allergies   Allergen Reactions   ??? Biaxin [Clarithromycin] Rash     Social History     Social History   ??? Marital status: SINGLE     Spouse name: N/A   ??? Number of children: N/A   ??? Years of education: N/A     Social History Main Topics   ??? Smoking status: Never Smoker   ??? Smokeless tobacco: Never Used   ??? Alcohol use 0.0 oz/week     0 Standard drinks or equivalent per week      Comment: occ   ??? Drug use: No   ??? Sexual activity: Not Currently     Other Topics Concern   ??? None     Social History Narrative     Family History   Problem Relation Age of Onset   ??? Suicide Mother    ??? Cancer Father      Past Surgical History   Procedure Laterality Date   ??? Hx wisdom teeth extraction     ??? Hx heent       laser eye surgery age 69   ??? Hx cholecystectomy       Patient Active Problem List   Diagnosis Code   ??? Depression F32.9   ??? 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 Endometriosis N80.9   ??? Restless leg syndrome G25.81   ??? Heart murmur R01.1   ??? 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 Epigastric pain R10.13   ??? Nausea R11.0     Current Outpatient Prescriptions on File Prior to Visit   Medication Sig Dispense Refill   ??? temazepam (RESTORIL) 30 mg capsule Take 1 Cap by mouth nightly. Max Daily Amount: 30 mg. 30 Cap 5    ??? sertraline (ZOLOFT) 25 mg tablet Take 1 Tab by mouth daily. 25 mg daily  for 1 week then increase to 50 mg daily 60 Tab 0   ??? levothyroxine (SYNTHROID) 100 mcg tablet Take 1 Tab by mouth Daily (before breakfast). Indications: HASHIMOTO THYROIDITIS 30 Tab 5   ??? traMADol (ULTRAM) 50 mg tablet Take 1 Tab by mouth  every six (6) hours as needed for Pain. Max Daily Amount: 200 mg. 1 Tab 0   ??? L-Norgest&E Estradiol-E Estrad (DAYSEE) 0.15 mg-30 mcg (84)/10 mcg (7) Take 1 Tab by mouth nightly.       No current facility-administered medications on file prior to visit.           ROS:    Review of Systems   Constitutional: Positive for malaise/fatigue. Negative for chills, fever and weight loss.   HENT: Negative for congestion and sore throat.    Eyes: Negative for blurred vision, double vision, photophobia and redness.   Respiratory: Negative for cough, shortness of breath and wheezing.    Cardiovascular: Negative for chest pain.   Gastrointestinal: Positive for abdominal pain (which is chronic). Negative for constipation, diarrhea, nausea and vomiting.   Genitourinary: Negative for dysuria, frequency and urgency.   Skin: Negative for itching and rash.   Neurological: Negative for dizziness, sensory change, speech change, focal weakness, seizures, loss of consciousness and headaches.   Psychiatric/Behavioral: Positive for depression. The patient has insomnia (which is chornic). The patient is not nervous/anxious.         Visit Vitals   ??? BP 122/86   ??? Pulse 88   ??? Ht 5' (1.524 m)   ??? Wt 164 lb (74.4 kg)   ??? SpO2 100%   ??? BMI 32.03 kg/m2       PHYSICAL EXAM:    Physical Exam   Constitutional: She is oriented to person, place, and time and well-developed, well-nourished, and in no distress. No distress.   HENT:   Head: Normocephalic and atraumatic.   Right Ear: External ear normal.   Left Ear: External ear normal.   Nose: Nose normal.   Mouth/Throat: Oropharynx is clear and moist.    Eyes: Conjunctivae and EOM are normal. Pupils are equal, round, and reactive to light.   Neck: Normal range of motion. Neck supple.   Cardiovascular: Normal rate, regular rhythm and normal heart sounds.    No murmur heard.  Pulmonary/Chest: Effort normal and breath sounds normal. No respiratory distress.   Abdominal: Soft. Bowel sounds are normal. She exhibits no distension. There is no tenderness. There is no rebound.   Musculoskeletal: Normal range of motion. She exhibits no edema or tenderness.   Lymphadenopathy:     She has no cervical adenopathy.   Neurological: She is alert and oriented to person, place, and time. No cranial nerve deficit. Gait normal.   Skin: Skin is warm and dry. No rash noted. She is not diaphoretic.   Psychiatric: Judgment normal. She exhibits a depressed mood. She has a flat affect.   Vitals reviewed.        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  - sertraline (ZOLOFT) 50 mg tablet; Take 1 Tab by mouth daily.  Dispense: 30 Tab; Refill: 2    2. Endometriosis  Will refill her pain medication today.  - traMADol (ULTRAM) 50 mg tablet; Take 1 Tab by mouth every six (6) hours as needed for Pain. Max Daily Amount: 200 mg.  Dispense: 30 Tab; Refill: 3

## 2015-01-05 ENCOUNTER — Ambulatory Visit
Admit: 2015-01-05 | Discharge: 2015-01-05 | Payer: BLUE CROSS/BLUE SHIELD | Attending: Obstetrics & Gynecology | Primary: Family Medicine

## 2015-01-05 DIAGNOSIS — R102 Pelvic and perineal pain: Secondary | ICD-10-CM

## 2015-01-05 NOTE — Progress Notes (Signed)
HPI  Carla Patterson is a 34 y.o. female seen for pelvic pain.  She has had painful periods and pelvic pain for years and was told she has endometriosis.  She has been on continuous bcps but this is no longer controlling her pain.       Past Medical History, Past Surgical History, Family history, Social History, and Medications were all reviewed with the patient today and updated as necessary.     Current Outpatient Prescriptions   Medication Sig   ??? sertraline (ZOLOFT) 50 mg tablet Take 1 Tab by mouth daily.   ??? traMADol (ULTRAM) 50 mg tablet Take 1 Tab by mouth every six (6) hours as needed for Pain. Max Daily Amount: 200 mg.   ??? temazepam (RESTORIL) 30 mg capsule Take 1 Cap by mouth nightly. Max Daily Amount: 30 mg.   ??? levothyroxine (SYNTHROID) 100 mcg tablet Take 1 Tab by mouth Daily (before breakfast). Indications: HASHIMOTO THYROIDITIS   ??? L-Norgest&E Estradiol-E Estrad (DAYSEE) 0.15 mg-30 mcg (84)/10 mcg (7) Take 1 Tab by mouth nightly.     No current facility-administered medications for this visit.      Allergies   Allergen Reactions   ??? Biaxin [Clarithromycin] Rash     Past Medical History   Diagnosis Date   ??? 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 Zebedee Iba   ??? Chest pain      unspecified   ??? Cholelithiasis    ??? Depression 09/24/2013   ??? Endometriosis 09/24/2013   ??? Epigastric pain 01/26/2014   ??? GS (gallstone) 01/26/2014   ??? Hashimoto's disease    ??? Insomnia    ??? Left bundle branch block    ??? Nausea & vomiting age 40     per pt-- with wisdom teeth was given phenergan and had no problem   ??? Obese      bmi- 31   ??? PCOS (polycystic ovarian syndrome)    ??? Refusal of blood transfusions as patient is Jehovah's Witness    ??? Restless leg syndrome 09/24/2013   ??? Thyroid disease dx-11/2013     hasimoto     Past Surgical History   Procedure Laterality Date   ??? Hx wisdom teeth extraction     ??? Hx heent       laser eye surgery age 33   ??? Hx cholecystectomy       Family History   Problem Relation Age of Onset    ??? Suicide Mother    ??? Cancer Father      Esophageal   ??? Cancer Maternal Grandmother      Cervical and Ovarian Cancer      Social History   Substance Use Topics   ??? Smoking status: Never Smoker   ??? Smokeless tobacco: Never Used   ??? Alcohol use 0.0 oz/week     0 Standard drinks or equivalent per week      Comment: occ       History   Sexual Activity   ??? Sexual activity: Not Currently     Obstetric History    G0   P0   T0   P0   A0   TAB0   SAB0   E0   M0   L0           Health Maintenance  Mammogram:   Colonoscopy:   Bone Density:  Pap Smear: 3-5 years ago    ROS:  Review of Systems  General: Not Present- Appetite Loss, Chills, Excessive Crying, Fatigue, Fever, Night Sweats and Tiredness.  Skin: Not Present- Bruising, Change in Wart/Mole, Excessive Sweating, Itching, Nail Changes, New Lesions, Rash, Skin Color Changes and Ulcer.  HEENT: Not Present- Headache, Blurred Vision, Double Vision, Glaucoma, Visual Disturbances, Hearing Loss, Ringing in the Ears, Vertigo, Nose Bleed, Bleeding Gums, Hoarseness and Sore Throat.  Neck: Not Present- Neck Pain and Neck Swelling.  Respiratory: Not Present- Cough, Difficulty Breathing and Difficulty Breathing on Exertion.  Breast: Not Present- Breast Mass, Breast Pain, Breast Swelling, Nipple Discharge, Nipple Pain, Recent Breast Size Changes and Skin Changes.  Cardiovascular: Not Present- Abnormal Blood Pressure, Chest Pain, Edema, Fainting / Blacking Out, Palpitations, Shortness of Breath and Swelling of Extremities.  Gastrointestinal: Not Present- Abdominal Pain, Abdominal Swelling, Bloating, Change in Bowel Habits, Constipation, Diarrhea, Difficulty Swallowing, Gets full quickly at meals, Nausea, Rectal Bleeding and Vomiting.  Female Genitourinary: Not Present-  Dyspareunia, Excessive Menstrual Bleeding, Menstrual Irregularities,  Urinary Complaints, Vaginal Discharge, Vaginal dryness and Vaginal itching/burning.  + for pelvic pain and dysmenorrhea   Musculoskeletal: Not Present- Joint Pain and Muscle Pain.  Neurological: Not Present- Dizziness, Fainting, Headaches and Seizures.  Psychiatric: Not Present- Anxiety, Depression, Mood changes and Panic Attacks.  Endocrine: Not Present- Appetite Changes, Cold Intolerance, Excessive Thirst, Excessive Urination and Heat Intolerance.  Hematology: Not Present- Abnormal Bleeding, Easy Bruising and Enlarged Lymph Nodes.            PHYSICAL EXAM:    Visit Vitals   ??? BP 130/90 (BP 1 Location: Left arm, BP Patient Position: Sitting)   ??? Wt 166 lb (75.3 kg)   ??? LMP 10/25/2014  Comment: On Continuous BCP   ??? BMI 32.42 kg/m2     Physical Exam   General   Mental Status - Alert. General Appearance - Cooperative.     Abdomen   Inspection: - Inspection Normal.   Palpation/Percussion: Palpation and Percussion of the abdomen reveal - Non Tender, No Rebound tenderness, No Rigidity (guarding), No hepatosplenomegaly, No Palpable abdominal masses and Soft.   Auscultation: Auscultation of the abdomen reveals - Bowel sounds normal.     Female Genitourinary     External Genitalia   Vulva: - Normal. Perineum - Normal. Bartholin's Gland - Bilateral - Normal. Clitoris - Normal.   Introitus: Characteristics - Normal.   Urethra: Characteristics - Normal.     Speculum & Bimanual   Vagina: Vaginal Mucosa - Normal.   Vaginal Wall: - Normal.   Vaginal Lesions - None.   Cervix: Characteristics - Normal.   Uterus: Characteristics - Tender;  Feels fixed  Adnexa: - Tender  Bladder - Normal.         Medical problems and test results were reviewed with the patient today.     ASSESSMENT and PLAN    [name] was seen today for pelvic pain.    Diagnoses and all orders for this visit:    Pelvic pain in female  -     76830 - ECHOGRAPHY,TRANSVAGINAL  -     PAP IG, RFX APTIMA HPV ASCUS ([id number]))    Bloating  -     76830 - ECHOGRAPHY,TRANSVAGINAL    Comment  76830---pelvic pain/bloating  Uterus appears normal  Endo = 2.714mm and appears normmal  Rt ovy appears normal   Lt ovy appears normal  Both ovaries appear to be touching the uterus. Question adhered.  No free fluid seen  Date: 01/05/2015 Perf. Physician: Dr. Phineas Realhyrel Vessie Olmsted, MD Sonographer: Kathlen BrunswickPaula Lister, RDMS  Reviewed [place] and discussed with patient.  It appears the ovaries are adhered to the uterus and on her pelvic exam the pelvis feels "fixed".  I suspect she does have endometriosis.  Discussed proceeding with [facility], BSO.  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  She will call when she is ready to schedule    Follow-up Disposition:  Return if symptoms worsen or fail to improve.      Carney Harder, MD

## 2015-01-06 MED ORDER — LEVOTHYROXINE 100 MCG TAB
100 mcg | ORAL_TABLET | Freq: Every day | ORAL | 5 refills | Status: DC
Start: 2015-01-06 — End: 2015-04-08

## 2015-01-11 ENCOUNTER — Ambulatory Visit
Admit: 2015-01-11 | Discharge: 2015-01-11 | Payer: BLUE CROSS/BLUE SHIELD | Attending: Family Medicine | Primary: Family Medicine

## 2015-01-11 DIAGNOSIS — I10 Essential (primary) hypertension: Secondary | ICD-10-CM

## 2015-01-11 LAB — PAP IG, RFX APTIMA HPV ASCUS (507800)
.: 0
LABCORP 019018: 0

## 2015-01-11 MED ORDER — TRAMADOL 50 MG TAB
50 mg | ORAL_TABLET | Freq: Four times a day (QID) | ORAL | 5 refills | Status: DC | PRN
Start: 2015-01-11 — End: 2015-12-05

## 2015-01-11 MED ORDER — ENALAPRIL MALEATE 10 MG TAB
10 mg | ORAL_TABLET | Freq: Every day | ORAL | 5 refills | Status: DC
Start: 2015-01-11 — End: 2015-04-08

## 2015-01-11 MED ORDER — SERTRALINE 50 MG TAB
50 mg | ORAL_TABLET | Freq: Every day | ORAL | 5 refills | Status: DC
Start: 2015-01-11 — End: 2015-04-08
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
## 2015-01-11 NOTE — Patient Instructions (Addendum)
High Blood Pressure: Care Instructions  Your Care Instructions  If your blood pressure is usually above 140/90, you have high blood pressure, or hypertension. That means the top number is 140 or higher or the bottom number is 90 or higher, or both.  Despite what a lot of people think, high blood pressure usually doesn't cause headaches or make you feel dizzy or lightheaded. It usually has no symptoms. But it does increase your risk for heart attack, stroke, and kidney or eye damage. The higher your blood pressure, the more your risk increases.  Your doctor will give you a goal for your blood pressure. Your goal will be based on your health and your age. An example of a goal is to keep your blood pressure below 140/90.  Lifestyle changes, such as eating healthy and being active, are always important to help lower blood pressure. You might also take medicine to reach your blood pressure goal.  Follow-up care is a key part of your treatment and safety. Be sure to make and go to all appointments, and call your doctor if you are having problems. It's also a good idea to know your test results and keep a list of the medicines you take.  How can you care for yourself at home?  Medical treatment  ?? If you stop taking your medicine, your blood pressure will go back up. You may take one or more types of medicine to lower your blood pressure. Be safe with medicines. Take your medicine exactly as prescribed. Call your doctor if you think you are having a problem with your medicine.  ?? Talk to your doctor before you start taking aspirin every day. Aspirin can help certain people lower their risk of a heart attack or stroke. But taking aspirin isn't right for everyone, because it can cause serious bleeding.  ?? See your doctor regularly. You may need to see the doctor more often at first or until your blood pressure comes down.  ?? If you are taking blood pressure medicine, talk to your doctor before  you take decongestants or anti-inflammatory medicine, such as ibuprofen. Some of these medicines can raise blood pressure.  ?? Learn how to check your blood pressure at home.  Lifestyle changes  ?? Stay at a healthy weight. This is especially important if you put on weight around the waist. Losing even 10 pounds can help you lower your blood pressure.  ?? If your doctor recommends it, get more exercise. Walking is a good choice. Bit by bit, increase the amount you walk every day. Try for at least 30 minutes on most days of the week. You also may want to swim, bike, or do other activities.  ?? Avoid or limit alcohol. Talk to your doctor about whether you can drink any alcohol.  ?? Try to limit how much sodium you eat to less than 2,300 milligrams (mg) a day. Your doctor may ask you to try to eat less than 1,500 mg a day.  ?? Eat plenty of fruits (such as bananas and oranges), vegetables, legumes, whole grains, and low-fat dairy products.  ?? Lower the amount of saturated fat in your diet. Saturated fat is found in animal products such as milk, cheese, and meat. Limiting these foods may help you lose weight and also lower your risk for heart disease.  ?? Do not smoke. Smoking increases your risk for heart attack and stroke. If you need help quitting, talk to your doctor about stop-smoking programs and medicines.   These can increase your chances of quitting for good.  When should you call for help?  Call 911 anytime you think you may need emergency care. This may mean having symptoms that suggest that your blood pressure is causing a serious heart or blood vessel problem. Your blood pressure may be over 180/110.  For example, call 911 if:  ?? You have symptoms of a heart attack. These may include:  ?? Chest pain or pressure, or a strange feeling in the chest.  ?? Sweating.  ?? Shortness of breath.  ?? Nausea or vomiting.  ?? Pain, pressure, or a strange feeling in the back, neck, jaw, or upper  belly or in one or both shoulders or arms.  ?? Lightheadedness or sudden weakness.  ?? A fast or irregular heartbeat.  ?? You have symptoms of a stroke. These may include:  ?? Sudden numbness, tingling, weakness, or loss of movement in your face, arm, or leg, especially on only one side of your body.  ?? Sudden vision changes.  ?? Sudden trouble speaking.  ?? Sudden confusion or trouble understanding simple statements.  ?? Sudden problems with walking or balance.  ?? A sudden, severe headache that is different from past headaches.  ?? You have severe back or belly pain.  Do not wait until your blood pressure comes down on its own. Get help right away.  Call your doctor now or seek immediate care if:  ?? Your blood pressure is much higher than normal (such as 180/110 or higher), but you don't have symptoms.  ?? You think high blood pressure is causing symptoms, such as:  ?? Severe headache.  ?? Blurry vision.  Watch closely for changes in your health, and be sure to contact your doctor if:  ?? Your blood pressure measures 140/90 or higher at least 2 times. That means the top number is 140 or higher or the bottom number is 90 or higher, or both.  ?? You think you may be having side effects from your blood pressure medicine.  ?? Your blood pressure is usually normal, but it goes above normal at least 2 times.  Where can you learn more?  Go to http://www.healthwise.net/GoodHelpConnections.  Enter X567 in the search box to learn more about "High Blood Pressure: Care Instructions."  Current as of: August 30, 2014  Content Version: 11.1  ?? 2006-2016 [employer]. Care instructions adapted under license by [employer] (which disclaims liability or warranty for this information). If you have questions about a medical condition or this instruction, always ask your healthcare professional. [employer]wise, Incorporated disclaims any warranty or liability for your use of this information.

## 2015-01-25 ENCOUNTER — Encounter: Attending: Family Medicine | Primary: Family Medicine

## 2015-03-22 ENCOUNTER — Encounter: Attending: Family | Primary: Family Medicine

## 2015-04-04 ENCOUNTER — Ambulatory Visit
Admit: 2015-04-04 | Discharge: 2015-04-04 | Payer: BLUE CROSS/BLUE SHIELD | Attending: Obstetrics & Gynecology | Primary: Family Medicine

## 2015-04-04 DIAGNOSIS — R102 Pelvic and perineal pain: Secondary | ICD-10-CM

## 2015-04-04 MED ORDER — HYDROCODONE-ACETAMINOPHEN 5 MG-325 MG TAB
5-325 mg | ORAL_TABLET | Freq: Four times a day (QID) | ORAL | 0 refills | Status: DC | PRN
Start: 2015-04-04 — End: 2015-04-08

## 2015-04-04 MED ORDER — BACLOFEN 20 MG TAB
20 mg | ORAL_TABLET | Freq: Three times a day (TID) | ORAL | 1 refills | Status: DC
Start: 2015-04-04 — End: 2015-04-08

## 2015-04-04 NOTE — Progress Notes (Signed)
HPI:  Carla Patterson is a 35 y.o. female seen for consult regarding surgery.    Chief Complaint: Advice Only      HPI Comments: Patient here to discuss scheduling hysterectomy.  Dr. Elwyn LadeStoner had recommended surgery due to patient's pelvic pain and possible endometriosis per patient. She is a Engineer, civil (consulting)nurse at [employer].  She states the pain interferes with her at work.  She has not had definitive diagnosis of endometriosis but has symptoms which are compatable with this.      Advice Only   The history is provided by the patient. This is a new problem. The current episode started more than 1 week ago. The problem occurs constantly. The problem has not changed since onset.Pertinent negatives include no chest pain and no abdominal pain. Nothing aggravates the symptoms. Nothing relieves the symptoms.       Past Medical History, Past Surgical History, Family history, Social History, and Medications were all reviewed with the patient today and updated as necessary.     Current Outpatient Prescriptions   Medication Sig   ??? enalapril (VASOTEC) 10 mg tablet Take 1 Tab by mouth daily.   ??? traMADol (ULTRAM) 50 mg tablet Take 1 Tab by mouth every six (6) hours as needed for Pain. Max Daily Amount: 200 mg.   ??? sertraline (ZOLOFT) 50 mg tablet Take 1 Tab by mouth daily.   ??? levothyroxine (SYNTHROID) 100 mcg tablet Take 1 Tab by mouth Daily (before breakfast). Indications: HASHIMOTO THYROIDITIS   ??? temazepam (RESTORIL) 30 mg capsule Take 1 Cap by mouth nightly. Max Daily Amount: 30 mg.   ??? L-Norgest&E Estradiol-E Estrad (DAYSEE) 0.15 mg-30 mcg (84)/10 mcg (7) 3MPk Take 1 Tab by mouth nightly.     No current facility-administered medications for this visit.      Allergies   Allergen Reactions   ??? Biaxin [Clarithromycin] Rash     Past Medical History:   Diagnosis Date   ??? Arrhythmia     LBBB--- no meds--- followed by dr. Zebedee Ibaphaile   ??? Chest pain     unspecified   ??? Cholelithiasis    ??? Depression 09/24/2013   ??? Endometriosis 09/24/2013    ??? Epigastric pain 01/26/2014   ??? GS (gallstone) 01/26/2014   ??? Hashimoto's disease    ??? Hypertension    ??? Insomnia    ??? Left bundle branch block    ??? Nausea & vomiting age 32    per pt-- with wisdom teeth was given phenergan and had no problem   ??? Obese     bmi- 29   ??? PCOS (polycystic ovarian syndrome)    ??? Refusal of blood transfusions as patient is Jehovah's Witness    ??? Restless leg syndrome 09/24/2013   ??? Thyroid disease dx-11/2013    hasimoto     Past Surgical History:   Procedure Laterality Date   ??? HX CHOLECYSTECTOMY     ??? HX HEENT      laser eye surgery age 89   ??? HX WISDOM TEETH EXTRACTION       Family History   Problem Relation Age of Onset   ??? Suicide Mother    ??? Cancer Father      Esophageal   ??? Cancer Maternal Grandmother      Cervical and Ovarian Cancer      Social History   Substance Use Topics   ??? Smoking status: Never Smoker   ??? Smokeless tobacco: Never Used   ??? Alcohol use  0.0 oz/week     0 Standard drinks or equivalent per week      Comment: occ     Immunization History   Administered Date(s) Administered   ??? Hep B Vaccine 09/24/2000   ??? Influenza Vaccine 10/23/2010   ??? Tdap 09/24/2000     History   Sexual Activity   ??? Sexual activity: Not Currently     Obstetric History    G0   P0   T0   P0   A0   TAB0   SAB0   E0   M0   L0             ROS:    Review of Systems   Constitutional: Negative for appetite change.   HENT: Negative for congestion.    Respiratory: Negative for chest tightness.    Cardiovascular: Negative for chest pain.   Gastrointestinal: Negative for abdominal pain and vomiting.   Musculoskeletal: Negative for gait problem.   Neurological: Negative for syncope.   Psychiatric/Behavioral: Negative for agitation.        PHYSICAL EXAM:    Visit Vitals   ??? BP 140/86   ??? Ht 5' (1.524 m)   ??? Wt 167 lb (75.8 kg)   ??? LMP 03/07/2015   ??? BMI 32.61 kg/m2       Physical Exam   Constitutional: She appears well-developed.   HENT:   Head: Normocephalic.    Pulmonary/Chest: Effort normal. No respiratory distress.   Musculoskeletal: Normal range of motion.       Medical problems and test results were reviewed with the patient today.     ASSESSMENT and PLAN    Carla Patterson was seen today for advice only.    Diagnoses and all orders for this visit:    Pelvic pain    Discussed that there are other options for her than hysterectomy but would do this if she desires. Discussed laparoscopy and though no guarantee should be able to help and better assess her situation.  If endometriosis is seen or not could also use Lupron. Info given for this.   She is comfortable after discussion to try laparoscopy first. Will schedule laparoscopy . She wil call if changes her mind.       The procedure was reviewed in detail as well as the risks of bleeding, infection, DVT and potential surgical complications involving the bladder, ureters, colon or intestines.  Also the alternatives,  benefits, recovery and follow-up.  All of her questions were answered.    Greater than 50% of this 30 min visit was face to face  Follow-up Disposition: Not on File      Alver Fisher, MD  04/04/2015

## 2015-04-04 NOTE — Patient Instructions (Addendum)
Pelvic Pain: Care Instructions  Your Care Instructions    Pelvic pain, or pain in the lower belly, can have many causes. Often pelvic pain is not serious and gets better in a few days. If your pain continues or gets worse, you may need tests and treatment. Tell your doctor about any new symptoms. These may be signs of a serious problem.  Follow-up care is a key part of your treatment and safety. Be sure to make and go to all appointments, and call your doctor if you are having problems. It's also a good idea to know your test results and keep a list of the medicines you take.  How can you care for yourself at home?  ?? Rest until you feel better. Lie down, and raise your legs by placing a pillow under your knees.  ?? Drink plenty of fluids. You may find that small, frequent sips are easier on your stomach than if you drink a lot at once. Avoid drinks with carbonation or caffeine, such as soda pop, tea, or coffee.  ?? Try eating several small meals instead of 2 or 3 large ones. Eat mild foods, such as rice, dry toast or crackers, bananas, and applesauce. Avoid fatty and spicy foods, other fruits, and alcohol until 48 hours after your symptoms have gone away.  ?? Take an over-the-counter pain medicine, such as acetaminophen (Tylenol), ibuprofen (Advil, Motrin), or naproxen (Aleve). Read and follow all instructions on the label.  ?? Do not take two or more pain medicines at the same time unless the doctor told you to. Many pain medicines have acetaminophen, which is Tylenol. Too much acetaminophen (Tylenol) can be harmful.  ?? You can put a heating pad, a warm cloth, or moist heat on your belly to relieve pain.  When should you call for help?  Call 911 anytime you think you may need emergency care. For example, call if:  ?? You passed out (lost consciousness).  Call your doctor now or seek immediate medical care if:  ?? Your pain is getting worse.  ?? Your pain becomes focused in one area of your belly.   ?? You have severe vaginal bleeding. Severe means that you are soaking through your usual pads or tampons every hour for 2 or more hours and passing clots of blood.  ?? You have new symptoms such as fever, urinary problems or unexpected vaginal bleeding.  ?? You are dizzy or lightheaded, or you feel like you may faint.  Watch closely for changes in your health, and be sure to contact your doctor if:  ?? You do not get better as expected.  Where can you learn more?  Go to http://www.healthwise.net/GoodHelpConnections.  Enter B514 in the search box to learn more about "Pelvic Pain: Care Instructions."  Current as of: March 18, 2014  Content Version: 11.1  ?? 2006-2016 [employer]. Care instructions adapted under license by [employer] (which disclaims liability or warranty for this information). If you have questions about a medical condition or this instruction, always ask your healthcare professional. [employer] disclaims any warranty or liability for your use of this information.

## 2015-04-08 ENCOUNTER — Ambulatory Visit
Admit: 2015-04-08 | Discharge: 2015-05-02 | Payer: BLUE CROSS/BLUE SHIELD | Attending: Family Medicine | Primary: Family Medicine

## 2015-04-08 DIAGNOSIS — E034 Atrophy of thyroid (acquired): Secondary | ICD-10-CM

## 2015-04-08 MED ORDER — SERTRALINE 50 MG TAB
50 mg | ORAL_TABLET | Freq: Every day | ORAL | 5 refills | Status: DC
Start: 2015-04-08 — End: 2015-08-15

## 2015-04-08 MED ORDER — HYDROCODONE-ACETAMINOPHEN 5 MG-325 MG TAB
5-325 mg | ORAL_TABLET | Freq: Four times a day (QID) | ORAL | 0 refills | Status: DC | PRN
Start: 2015-04-08 — End: 2015-06-30

## 2015-04-08 MED ORDER — LEVOTHYROXINE 100 MCG TAB
100 mcg | ORAL_TABLET | Freq: Every day | ORAL | 5 refills | Status: DC
Start: 2015-04-08 — End: 2015-04-13

## 2015-04-08 MED ORDER — ENALAPRIL MALEATE 10 MG TAB
10 mg | ORAL_TABLET | Freq: Every day | ORAL | 5 refills | Status: DC
Start: 2015-04-08 — End: 2016-01-05

## 2015-04-08 MED ORDER — TEMAZEPAM 30 MG CAP
30 mg | ORAL_CAPSULE | Freq: Every evening | ORAL | 5 refills | Status: DC
Start: 2015-04-08 — End: 2015-11-04

## 2015-04-08 MED ORDER — METAXALONE 800 MG TAB
800 mg | ORAL_TABLET | Freq: Three times a day (TID) | ORAL | 2 refills | Status: AC
Start: 2015-04-08 — End: 2015-05-08
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
## 2015-04-09 LAB — METABOLIC PANEL, COMPREHENSIVE
A-G Ratio: 1.4 (ref 1.2–2.2)
ALT (SGPT): 23 IU/L (ref 0–32)
AST (SGOT): 17 IU/L (ref 0–40)
Albumin: 4.1 g/dL (ref 3.5–5.5)
Alk. phosphatase: 76 IU/L (ref 39–117)
BUN/Creatinine ratio: 16 (ref 8–20)
BUN: 13 mg/dL (ref 6–20)
Bilirubin, total: 0.2 mg/dL (ref 0.0–1.2)
CO2: 21 mmol/L (ref 18–29)
Calcium: 9.1 mg/dL (ref 8.7–10.2)
Chloride: 99 mmol/L (ref 96–106)
Creatinine: 0.81 mg/dL (ref 0.57–1.00)
GFR est AA: 108 mL/min/{1.73_m2} (ref >59)
GFR est non-AA: 94 mL/min/{1.73_m2} (ref >59)
GLOBULIN, TOTAL: 3 g/dL (ref 1.5–4.5)
Glucose: 98 mg/dL (ref 65–99)
Potassium: 4.2 mmol/L (ref 3.5–5.2)
Protein, total: 7.1 g/dL (ref 6.0–8.5)
Sodium: 139 mmol/L (ref 134–144)

## 2015-04-09 LAB — CBC WITH AUTOMATED DIFF
ABS. BASOPHILS: 0 10*3/uL (ref 0.0–0.2)
ABS. EOSINOPHILS: 0.1 10*3/uL (ref 0.0–0.4)
ABS. IMM. GRANS.: 0 10*3/uL (ref 0.0–0.1)
ABS. MONOCYTES: 0.6 10*3/uL (ref 0.1–0.9)
ABS. NEUTROPHILS: 7.6 10*3/uL — ABNORMAL HIGH (ref 1.4–7.0)
Abs Lymphocytes: 3.2 10*3/uL — ABNORMAL HIGH (ref 0.7–3.1)
BASOPHILS: 0 %
EOSINOPHILS: 1 %
HCT: 39.7 % (ref 34.0–46.6)
HGB: 13.6 g/dL (ref 11.1–15.9)
IMMATURE GRANULOCYTES: 0 %
Lymphocytes: 28 %
MCH: 30.2 pg (ref 26.6–33.0)
MCHC: 34.3 g/dL (ref 31.5–35.7)
MCV: 88 fL (ref 79–97)
MONOCYTES: 5 %
NEUTROPHILS: 66 %
PLATELET: 421 10*3/uL — ABNORMAL HIGH (ref 150–379)
RBC: 4.51 x10E6/uL (ref 3.77–5.28)
RDW: 13.8 % (ref 12.3–15.4)
WBC: 11.5 10*3/uL — ABNORMAL HIGH (ref 3.4–10.8)

## 2015-04-09 LAB — TSH 3RD GENERATION: TSH: 16.74 u[IU]/mL — ABNORMAL HIGH (ref 0.450–4.500)

## 2015-04-09 LAB — THYROID PEROXIDASE (TPO) AB: Thyroid peroxidase Ab: 154 IU/mL — ABNORMAL HIGH (ref 0–34)

## 2015-04-09 LAB — THYROGLOBULIN AB: Thyroglobulin Ab: 3 IU/mL — ABNORMAL HIGH (ref 0.0–0.9)

## 2015-04-10 NOTE — Progress Notes (Signed)
Please let Carla Patterson know that her thyroid is very abnormal.  She needs to be increased on her medication from 100 to 150.  Her antibodies against her thyroid were still high.

## 2015-04-11 NOTE — Progress Notes (Signed)
Pt notified.

## 2015-04-11 NOTE — Progress Notes (Signed)
Left message to call office

## 2015-04-11 NOTE — Telephone Encounter (Signed)
Pt calling requesting a rx for Zofran.

## 2015-04-12 MED ORDER — ONDANSETRON 4 MG TAB, RAPID DISSOLVE
4 mg | ORAL_TABLET | Freq: Three times a day (TID) | ORAL | 1 refills | Status: DC | PRN
Start: 2015-04-12 — End: 2015-05-31

## 2015-04-13 MED ORDER — LEVOTHYROXINE 150 MCG TAB
150 mcg | ORAL_TABLET | Freq: Every day | ORAL | 0 refills | Status: DC
Start: 2015-04-13 — End: 2015-05-11

## 2015-04-13 NOTE — Telephone Encounter (Signed)
Send in new dose per lab results. Rx ready to send.

## 2015-05-06 ENCOUNTER — Telehealth

## 2015-05-06 NOTE — Telephone Encounter (Signed)
Patient called and asked when she needed to have another tsh done. I spoke with Lambert Modyracie, RN who said that patient is okay to have another drawn Monday due to her being placed on an increased dose of thyroid medications. Lab has been signed and I have left the patient a VM to inform her that she can come in next week.

## 2015-05-09 ENCOUNTER — Ambulatory Visit
Admit: 2015-05-09 | Discharge: 2015-05-09 | Payer: BLUE CROSS/BLUE SHIELD | Attending: Obstetrics & Gynecology | Primary: Family Medicine

## 2015-05-09 DIAGNOSIS — Z1389 Encounter for screening for other disorder: Secondary | ICD-10-CM

## 2015-05-09 LAB — AMB POC URINALYSIS DIP STICK AUTO W/O MICRO
Bilirubin (UA POC): NEGATIVE
Glucose (UA POC): NEGATIVE
Ketones (UA POC): NEGATIVE
Nitrites (UA POC): NEGATIVE
Specific gravity (UA POC): 1.025 (ref 1.001–1.035)
Urobilinogen (UA POC): 0.2 (ref 0.2–1)
pH (UA POC): 6 (ref 4.6–8.0)

## 2015-05-09 LAB — AMB POC TOTAL HEMOGLOBIN (SPHB): Total Hemoglobin (SpHb)(POC): 13 g/dL

## 2015-05-09 NOTE — Progress Notes (Signed)
Carla Patterson  is a 35 y.o. female, G0 P0000.  Patient's last menstrual period was 05/07/2015 (exact date)., who is being seen for as a new patient to discuss spotting between cycles on OCP. She also was diagnosed with endometriosis and is having increased pain and would like to discuss possible treatments. She had a pelvic ultrasound and pap smear done with Carla Patterson on 01/05/15.  US showed normal uterus but ovaries appeared to be touching uterus.    She is currently taking a 90 day ocp.  She is fine with potentially not having children.    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  She is having it repeated this week.  Discussed if hyperactive - will need to see endocrine and have surgery clearance.        HISTORY:  G0  Gyn ZO:[id number]:   35 years old.  Heavy periods and clots.  Constant pain.  Never been sexually pain.   Has been on ocps for several years on ortho tricyclen.  Last year with spotting between cycles.    Sexual History:   History   Sexual Activity   ??? Sexual activity: Not Currently   ??? Partners: Male   ??? Birth control/ protection: Pill        Current Outpatient Prescriptions on File Prior to Visit   Medication Sig Dispense Refill   ??? levothyroxine (SYNTHROID) 150 mcg tablet Take 1 Tab by mouth Daily (before breakfast). 30 Tab 0   ??? ondansetron (ZOFRAN ODT) 4 mg disintegrating tablet Take 1 Tab by mouth every eight (8) hours as needed for Nausea for up to 12 doses. 12 Tab 1   ??? enalapril (VASOTEC) 10 mg tablet Take 1 Tab by mouth daily. 30 Tab 5   ??? sertraline (ZOLOFT) 50 mg tablet Take 1 Tab by mouth daily. 30 Tab 5   ??? temazepam (RESTORIL) 30 mg capsule Take 1 Cap by mouth nightly. Max Daily Amount: 30 mg. 30 Cap 5   ??? L-Norgest&E Estradiol-E Estrad (DAYSEE) 0.15 mg-30 mcg (84)/10 mcg (7) Take 1 Tab by mouth nightly.     ??? HYDROcodone-acetaminophen (NORCO) 5-325 mg per tablet Take 1 Tab by mouth every six (6) hours as needed for Pain. Max Daily Amount: 4 Tabs. 90  Tab 0   ??? [EXPIRED] metaxalone (SKELAXIN) 800 mg tablet Take 1 Tab by mouth three (3) times daily for 30 days. 90 Tab 2   ??? traMADol (ULTRAM) 50 mg tablet Take 1 Tab by mouth every six (6) hours as needed for Pain. Max Daily Amount: 200 mg. 30 Tab 5     No current facility-administered medications on file prior to visit.            Carla Patterson  has a past medical history of Arrhythmia; Chest pain; Cholelithiasis; Depression (09/24/2013); Endometriosis (09/24/2013); Epigastric pain (01/26/2014); GS (gallstone) (01/26/2014); Hashimoto's disease; Hypertension; Insomnia; Left bundle branch block; Nausea & vomiting (age 22); Obese; PCOS (polycystic ovarian syndrome); Refusal of blood transfusions as patient is Jehovah's Patterson; Restless leg syndrome (09/24/2013); and Thyroid disease (dx-11/2013). She also has no past medical history of Adverse effect of anesthesia; Difficult intubation; Malignant hyperthermia due to anesthesia; or Pseudocholinesterase deficiency. .    She  has a past surgical history that includes wisdom teeth extraction; heent; and cholecystectomy.    Her current meds are:    Current Outpatient Prescriptions:   ???  levothyroxine (SYNTHROID) 150 mcg tablet, Take 1 Tab by mouth  Daily (before breakfast)., Disp: 30 Tab, Rfl: 0  ???  ondansetron (ZOFRAN ODT) 4 mg disintegrating tablet, Take 1 Tab by mouth every eight (8) hours as needed for Nausea for up to 12 doses., Disp: 12 Tab, Rfl: 1  ???  enalapril (VASOTEC) 10 mg tablet, Take 1 Tab by mouth daily., Disp: 30 Tab, Rfl: 5  ???  sertraline (ZOLOFT) 50 mg tablet, Take 1 Tab by mouth daily., Disp: 30 Tab, Rfl: 5  ???  temazepam (RESTORIL) 30 mg capsule, Take 1 Cap by mouth nightly. Max Daily Amount: 30 mg., Disp: 30 Cap, Rfl: 5  ???  L-Norgest&E Estradiol-E Estrad (DAYSEE) 0.15 mg-30 mcg (84)/10 mcg (7) 3MPk, Take 1 Tab by mouth nightly., Disp: , Rfl:   ???  HYDROcodone-acetaminophen (NORCO) 5-325 mg per tablet, Take 1 Tab by  mouth every six (6) hours as needed for Pain. Max Daily Amount: 4 Tabs., Disp: 90 Tab, Rfl: 0  ???  traMADol (ULTRAM) 50 mg tablet, Take 1 Tab by mouth every six (6) hours as needed for Pain. Max Daily Amount: 200 mg., Disp: 30 Tab, Rfl: 5     Her family history includes Cancer in her father and maternal grandmother; Suicide in her mother.     ROS   Feeling well. No dyspnea or chest pain on exertion.  No abdominal pain, change in bowel habits, black or bloody stools.        PHYSICAL EXAM:  Visit Vitals   ??? BP 142/88   ??? Pulse 78   ??? Wt 166 lb (75.3 kg)   ??? LMP 05/07/2015 (Exact Date)   ??? SpO2 100%   ??? BMI 32.42 kg/m2     The patient appears well, alert, oriented x 3, in no distress.  Neck:  Supple with no thyromegaly  Heart:  RRR without murmurs, rubs or gallops  Abdomen: Non tender  Pelvic: deferred     ASSESSMENT:  Encounter Diagnoses   Name Primary?   ??? Screening for genitourinary condition Yes   ??? Well woman exam with routine gynecological exam    ??? Screening for iron deficiency anemia        PLAN:  All questions answered.  She was prepared to have laparoscopy and convert to hysterectomy depending on findings.  Discussed that she could have mild disease.  Need her to be closer to euthyroid.  Also with Carla Patterson, need to have more planning with hysterectomy.  She is content with possibly having 2 procedures in the future.  All questionsn answered.  Time spend in consultation:  30 minutes.    Orders Placed This Encounter   ??? Transcutaneous HgB ([phone])   ??? Urinalysis, Auto w/o Micro ([id number])       Carla MetroBrandi Kay Tanisia Yokley, DO

## 2015-05-10 ENCOUNTER — Encounter: Admit: 2015-05-10 | Discharge: 2015-05-10 | Payer: BLUE CROSS/BLUE SHIELD | Primary: Family Medicine

## 2015-05-10 DIAGNOSIS — E034 Atrophy of thyroid (acquired): Secondary | ICD-10-CM

## 2015-05-11 ENCOUNTER — Telehealth

## 2015-05-11 LAB — TSH 3RD GENERATION: TSH: 0.096 u[IU]/mL — ABNORMAL LOW (ref 0.450–4.500)

## 2015-05-11 MED ORDER — LEVOTHYROXINE 125 MCG TAB
125 mcg | ORAL_TABLET | Freq: Every day | ORAL | 1 refills | Status: DC
Start: 2015-05-11 — End: 2015-06-01

## 2015-05-11 NOTE — Telephone Encounter (Signed)
Pt calling stating that her surgeon gave her the results of her TSH that you ordered and monitor. She is concerned. See results. Her surgeon is referring her to endo.  Review and advise.

## 2015-05-11 NOTE — Telephone Encounter (Signed)
Pt was notified. Surgeon will not proceed with her gyn surgery until she sees an endo ,which the surgeon has referred her to. Pt understands the adjustment period to get her TSH regulated.

## 2015-05-11 NOTE — Telephone Encounter (Signed)
We need to decrease her thyroid medication down to and recheck her TSH in one month.  Did her surgeon say why she was concerned?  I am happy to refer her to endocrinology for a second opinion.

## 2015-05-23 MED ORDER — L NORGEST/E ESTRADIOL-E ESTRAD 0.15 MG-30 MCG (84)/10 MCG(7) TABS,3MOS
0.15 mg-30 mcg (84)/10 mcg (7) | PACK | Freq: Every evening | ORAL | 11 refills | Status: DC
Start: 2015-05-23 — End: 2015-11-08

## 2015-05-23 NOTE — Telephone Encounter (Signed)
Pt called requesting seasonique refill [facility]

## 2015-05-31 ENCOUNTER — Ambulatory Visit
Admit: 2015-05-31 | Discharge: 2015-05-31 | Payer: BLUE CROSS/BLUE SHIELD | Attending: Physician Assistant | Primary: Family Medicine

## 2015-05-31 DIAGNOSIS — I10 Essential (primary) hypertension: Secondary | ICD-10-CM

## 2015-05-31 MED ORDER — ONDANSETRON 4 MG TAB, RAPID DISSOLVE
4 mg | ORAL_TABLET | Freq: Three times a day (TID) | ORAL | 1 refills | Status: AC | PRN
Start: 2015-05-31 — End: ?

## 2015-05-31 MED ORDER — PROMETHAZINE 25 MG TAB
25 mg | ORAL_TABLET | Freq: Three times a day (TID) | ORAL | 0 refills | Status: DC | PRN
Start: 2015-05-31 — End: 2016-03-06
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
## 2015-06-01 ENCOUNTER — Ambulatory Visit
Admit: 2015-06-01 | Discharge: 2015-06-01 | Payer: BLUE CROSS/BLUE SHIELD | Attending: "Endocrinology | Primary: Family Medicine

## 2015-06-01 DIAGNOSIS — E039 Hypothyroidism, unspecified: Secondary | ICD-10-CM

## 2015-06-01 MED ORDER — SYNTHROID 125 MCG TABLET
125 mcg | ORAL_TABLET | Freq: Every day | ORAL | 11 refills | Status: DC
Start: 2015-06-01 — End: 2015-08-02
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
## 2015-06-02 NOTE — Other (Signed)
Patient verified name, DOB, and surgery as listed in [facility].     Type 2 surgery, PAT phone assessment complete.  Orders NOT received.    Labs per surgeon: No orders received at this time.   Labs per anesthesia protocol: Hgb and POC urine HCG DOS; orders signed and held in [employer] [facility].     Patient coming to Entrance B/Outpatient lab prior to surgery to have blood work drawn.     Patient refuses blood products. Blood refusal form placed on chart to be signed DOS.       Patient answered medical/surgical history questions at their best of ability. All prior to admission medications documented in [facility].    Patient instructed to take the following medications the day of surgery according to anesthesia guidelines with a small sip of water: Norco if needed, Omeprazole, Zofran if needed, and Synthroid.  Hold all vitamins 7 days prior to surgery and NSAIDS 5 days prior to surgery. Medications to be held: vitamin D, Multivitamin, Vitamin B Complex.     Patient instructed on the following and verbalizes understanding:  [facility]rive [facility], time of arrival to be called the day before by 1700  NPO after midnight including gum, mints, and ice chips  Responsible adult must drive patient to the hospital, stay during surgery, and patient will  need supervision 24 hours after anesthesia  Use Dial and Hibiclens in shower the night before surgery and on the morning of surgery  Leave all valuables(money and jewelry) at home but bring insurance card and ID on DOS  Do not wear make-up, nail polish, lotions, cologne, perfumes, powders, or oil on skin.

## 2015-06-03 ENCOUNTER — Inpatient Hospital Stay: Primary: Family Medicine

## 2015-06-07 LAB — T4, FREE: T4, Free: 1.07 ng/dL (ref 0.82–1.77)

## 2015-06-07 LAB — TSH 3RD GENERATION: TSH: 0.195 u[IU]/mL — ABNORMAL LOW (ref 0.450–4.500)

## 2015-06-08 ENCOUNTER — Telehealth

## 2015-06-08 NOTE — Telephone Encounter (Signed)
MyChart message to patient:    [name],  I just received thyroid labs drawn earlier this week.  Levels are better than before.  As you may recall from our visit 06/01/2015, we had wanted you to wait about 3 weeks from that visit before rechecking those labs, as you only recently changed her thyroid medication dose.  I [facility] for you to go back in 2 or 3 weeks from now to recheck your labs.  Please go back near the end of May or beginning of June to have that done.  You are welcome to come to our office, as we can draw blood and send it for you.  Thanks,  Dr. Wilkie AyeHorton

## 2015-06-14 ENCOUNTER — Encounter: Attending: Physician Assistant | Primary: Family Medicine

## 2015-06-14 NOTE — Telephone Encounter (Signed)
I never sent her thyroid level needed to be a "1".  She should see the My Chart message I sent her last week (06/08/2015) about rechecking her labs in 2 weeks so that we can see if her thyroid dose is correct.  If she has specific questions, she can send them to me on My Chart.

## 2015-06-14 NOTE — Telephone Encounter (Signed)
Patient states that she was told that if her TSH was still not at a 1 that she would have surgery done for PCOS and endometriosis. She wanted to know if she should have her blood work done again and have her synthroid changed if needed or what she needs to do at this point.

## 2015-06-14 NOTE — Telephone Encounter (Signed)
Patient called and states her thyroid level is not "1" yet and wants to have labs to check level to see what can be done so she can schedule surgery bc symptoms are worsening

## 2015-06-14 NOTE — Telephone Encounter (Signed)
Patient informed of mychart message sent by Dr. Wilkie AyeHorton on 06/08/15. She will have labs done the end of may or first week of June.

## 2015-06-24 ENCOUNTER — Encounter: Payer: BLUE CROSS/BLUE SHIELD | Primary: Family Medicine

## 2015-06-25 LAB — T4, FREE: T4, Free: 1.25 ng/dL (ref 0.82–1.77)

## 2015-06-25 LAB — TSH 3RD GENERATION: TSH: 0.3 u[IU]/mL — ABNORMAL LOW (ref 0.450–4.500)

## 2015-06-27 NOTE — Telephone Encounter (Signed)
[name], as you can see, your thyroid level is much better (TSH went from 0.096 to 0.300 following the recent decrease in your levothyroxine dose).  Please continue it as prescribed and recheck labs before your next appointment with me.  Thank you.

## 2015-06-30 ENCOUNTER — Ambulatory Visit
Admit: 2015-06-30 | Discharge: 2015-06-30 | Payer: BLUE CROSS/BLUE SHIELD | Attending: Family Medicine | Primary: Family Medicine

## 2015-06-30 DIAGNOSIS — R1013 Epigastric pain: Secondary | ICD-10-CM

## 2015-06-30 MED ORDER — PROMETHAZINE 25 MG TAB
25 mg | ORAL_TABLET | Freq: Four times a day (QID) | ORAL | 0 refills | Status: DC | PRN
Start: 2015-06-30 — End: 2016-02-14

## 2015-06-30 MED ORDER — HYDROCODONE-ACETAMINOPHEN 5 MG-325 MG TAB
5-325 mg | ORAL_TABLET | Freq: Four times a day (QID) | ORAL | 0 refills | Status: DC | PRN
Start: 2015-06-30 — End: 2015-08-24

## 2015-06-30 MED ORDER — ONDANSETRON 8 MG TAB, RAPID DISSOLVE
8 mg | ORAL_TABLET | Freq: Three times a day (TID) | ORAL | 1 refills | Status: DC | PRN
Start: 2015-06-30 — End: 2016-03-06
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
## 2015-06-30 NOTE — Patient Instructions (Addendum)
Abdominal Pain: Care Instructions  Your Care Instructions    Abdominal pain has many possible causes. Some aren't serious and get better on their own in a few days. Others need more testing and treatment. If your pain continues or gets worse, you need to be rechecked and may need more tests to find out what is wrong. You may need surgery to correct the problem.  Don't ignore new symptoms, such as fever, nausea and vomiting, urination problems, pain that gets worse, and dizziness. These may be signs of a more serious problem.  Your doctor may have recommended a follow-up visit in the next 8 to 12 hours. If you are not getting better, you may need more tests or treatment.  The doctor has checked you carefully, but problems can develop later. If you notice any problems or new symptoms, get medical treatment right away.  Follow-up care is a key part of your treatment and safety. Be sure to make and go to all appointments, and call your doctor if you are having problems. It's also a good idea to know your test results and keep a list of the medicines you take.  How can you care for yourself at home?  ?? Rest until you feel better.  ?? To prevent dehydration, drink plenty of fluids, enough so that your urine is light yellow or clear like water. Choose water and other caffeine-free clear liquids until you feel better. If you have kidney, heart, or liver disease and have to limit fluids, talk with your doctor before you increase the amount of fluids you drink.  ?? If your stomach is upset, eat mild foods, such as rice, dry toast or crackers, bananas, and applesauce. Try eating several small meals instead of two or three large ones.  ?? Wait until 48 hours after all symptoms have gone away before you have spicy foods, alcohol, and drinks that contain caffeine.  ?? Do not eat foods that are high in fat.  ?? Avoid anti-inflammatory medicines such as aspirin, ibuprofen (Advil,  Motrin), and naproxen (Aleve). These can cause stomach upset. Talk to your doctor if you take daily aspirin for another health problem.  When should you call for help?  Call 911 anytime you think you may need emergency care. For example, call if:  ?? You passed out (lost consciousness).  ?? You pass maroon or very bloody stools.  ?? You vomit blood or what looks like coffee grounds.  ?? You have new, severe belly pain.  Call your doctor now or seek immediate medical care if:  ?? Your pain gets worse, especially if it becomes focused in one area of your belly.  ?? You have a new or higher fever.  ?? Your stools are black and look like tar, or they have streaks of blood.  ?? You have unexpected vaginal bleeding.  ?? You have symptoms of a urinary tract infection. These may include:  ?? Pain when you urinate.  ?? Urinating more often than usual.  ?? Blood in your urine.  ?? You are dizzy or lightheaded, or you feel like you may faint.  Watch closely for changes in your health, and be sure to contact your doctor if:  ?? You are not getting better after 1 day (24 hours).  Where can you learn more?  Go to http://www.healthwise.net/[employer]Help[employer]ctions.  Enter E907 in the search box to learn more about "Abdominal Pain: Care Instructions."  Current as of: Jun 18, 2014  Content Version: 11.2  ??   2006-2017 [employer]wise[employer]. Care instructions adapted under license by [employer] (which disclaims liability or warranty for this information). If you have questions about a medical condition or this instruction, always ask your healthcare professional. Healthwise, Incorporated disclaims any warranty or liability for your use of this information.

## 2015-07-01 LAB — METABOLIC PANEL, COMPREHENSIVE
A-G Ratio: 1.3 (ref 1.2–2.2)
ALT (SGPT): 14 IU/L (ref 0–32)
AST (SGOT): 14 IU/L (ref 0–40)
Albumin: 4 g/dL (ref 3.5–5.5)
Alk. phosphatase: 75 IU/L (ref 39–117)
BUN/Creatinine ratio: 10 (ref 9–23)
BUN: 8 mg/dL (ref 6–20)
Bilirubin, total: <0.2 mg/dL (ref 0.0–1.2)
CO2: 18 mmol/L (ref 18–29)
Calcium: 9.6 mg/dL (ref 8.7–10.2)
Chloride: 104 mmol/L (ref 96–106)
Creatinine: 0.82 mg/dL (ref 0.57–1.00)
GFR est AA: 106 mL/min/{1.73_m2} (ref >59)
GFR est non-AA: 92 mL/min/{1.73_m2} (ref >59)
GLOBULIN, TOTAL: 3.2 g/dL (ref 1.5–4.5)
Glucose: 97 mg/dL (ref 65–99)
Potassium: 4 mmol/L (ref 3.5–5.2)
Protein, total: 7.2 g/dL (ref 6.0–8.5)
Sodium: 141 mmol/L (ref 134–144)

## 2015-07-01 LAB — CBC WITH AUTOMATED DIFF
ABS. BASOPHILS: 0 10*3/uL (ref 0.0–0.2)
ABS. EOSINOPHILS: 0.1 10*3/uL (ref 0.0–0.4)
ABS. IMM. GRANS.: 0 10*3/uL (ref 0.0–0.1)
ABS. MONOCYTES: 0.4 10*3/uL (ref 0.1–0.9)
ABS. NEUTROPHILS: 6.3 10*3/uL (ref 1.4–7.0)
Abs Lymphocytes: 2.3 10*3/uL (ref 0.7–3.1)
BASOPHILS: 0 %
EOSINOPHILS: 1 %
HCT: 39.3 % (ref 34.0–46.6)
HGB: 13.3 g/dL (ref 11.1–15.9)
IMMATURE GRANULOCYTES: 0 %
Lymphocytes: 25 %
MCH: 29.3 pg (ref 26.6–33.0)
MCHC: 33.8 g/dL (ref 31.5–35.7)
MCV: 87 fL (ref 79–97)
MONOCYTES: 4 %
NEUTROPHILS: 70 %
PLATELET: 392 10*3/uL — ABNORMAL HIGH (ref 150–379)
RBC: 4.54 x10E6/uL (ref 3.77–5.28)
RDW: 13.1 % (ref 12.3–15.4)
WBC: 9 10*3/uL (ref 3.4–10.8)

## 2015-07-01 LAB — IRON PROFILE
Iron % saturation: 14 % — ABNORMAL LOW (ref 15–55)
Iron: 55 ug/dL (ref 27–159)
TIBC: 399 ug/dL (ref 250–450)
UIBC: 344 ug/dL (ref 131–425)

## 2015-07-01 LAB — FERRITIN: Ferritin: 51 ng/mL (ref 15–150)

## 2015-07-01 LAB — LIPASE: Lipase: 21 U/L (ref 0–59)

## 2015-07-01 NOTE — Telephone Encounter (Signed)
Pt is sch for a CT scan on 07-07-15 . Pt wants to know if it is okay to wait or do you want it earlier? If want earlier will have to re order as STAT.

## 2015-07-01 NOTE — Progress Notes (Signed)
Pt notified

## 2015-07-01 NOTE — Progress Notes (Signed)
Please let Carla Patterson know that her lab work was normal and her lipase was normal.  Her iron studies were basically normal.

## 2015-07-04 NOTE — Telephone Encounter (Signed)
Okay to wait.

## 2015-07-07 ENCOUNTER — Inpatient Hospital Stay: Admit: 2015-07-07 | Payer: BLUE CROSS/BLUE SHIELD | Attending: Family Medicine | Primary: Family Medicine

## 2015-07-07 DIAGNOSIS — R1013 Epigastric pain: Secondary | ICD-10-CM

## 2015-07-07 MED ORDER — SALINE PERIPHERAL FLUSH PRN
Freq: Once | INTRAMUSCULAR | Status: AC
Start: 2015-07-07 — End: 2015-07-07
  Administered 2015-07-07: 17:00:00

## 2015-07-07 MED ORDER — SODIUM CHLORIDE 0.9% BOLUS IV
0.9 % | Freq: Once | INTRAVENOUS | Status: AC
Start: 2015-07-07 — End: 2015-07-07
  Administered 2015-07-07: 17:00:00 via INTRAVENOUS

## 2015-07-07 MED ORDER — DIATRIZOATE MEGLUMINE & SODIUM 66 %-10 % ORAL SOLN
66-10 % | Freq: Once | ORAL | Status: AC
Start: 2015-07-07 — End: 2015-07-07
  Administered 2015-07-07: 17:00:00 via ORAL

## 2015-07-07 MED ORDER — IOPAMIDOL 76 % IV SOLN
370 mg iodine /mL (76 %) | Freq: Once | INTRAVENOUS | Status: AC
Start: 2015-07-07 — End: 2015-07-07
  Administered 2015-07-07: 17:00:00 via INTRAVENOUS

## 2015-07-07 NOTE — Progress Notes (Signed)
Please let Carla Patterson know that her ct scan was normal.

## 2015-07-07 NOTE — Progress Notes (Signed)
Pt notified

## 2015-07-29 NOTE — Telephone Encounter (Signed)
Pt has an appt on Tuesday but is requesting a refill on her RX of "Synthroid 125 mcg".  She has some 150 mcg but stated Dr Rexene EdisonH wants her on the 125.  She is completely out, otherwise she would wait.  Please advise.    Thanks  Sam

## 2015-07-29 NOTE — Telephone Encounter (Signed)
Called in refill to patient's pharmacy. Will inform patient.

## 2015-07-29 NOTE — Telephone Encounter (Signed)
Left message informing patient of her refill called in to her pharmacy.

## 2015-08-01 ENCOUNTER — Encounter: Admit: 2015-08-01 | Discharge: 2015-08-01 | Payer: BLUE CROSS/BLUE SHIELD | Primary: Family Medicine

## 2015-08-02 ENCOUNTER — Ambulatory Visit
Admit: 2015-08-02 | Discharge: 2015-08-02 | Payer: BLUE CROSS/BLUE SHIELD | Attending: "Endocrinology | Primary: Family Medicine

## 2015-08-02 DIAGNOSIS — E039 Hypothyroidism, unspecified: Secondary | ICD-10-CM

## 2015-08-02 LAB — TSH 3RD GENERATION: TSH: 0.376 u[IU]/mL — ABNORMAL LOW (ref 0.450–4.500)

## 2015-08-02 LAB — T4, FREE: T4, Free: 0.9 ng/dL (ref 0.82–1.77)

## 2015-08-02 MED ORDER — SYNTHROID 125 MCG TABLET
125 mcg | ORAL_TABLET | Freq: Every day | ORAL | 3 refills | Status: DC
Start: 2015-08-02 — End: 2015-11-08
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
## 2015-08-09 ENCOUNTER — Ambulatory Visit
Admit: 2015-08-09 | Discharge: 2015-08-09 | Payer: BLUE CROSS/BLUE SHIELD | Attending: Obstetrics & Gynecology | Primary: Family Medicine

## 2015-08-09 DIAGNOSIS — N939 Abnormal uterine and vaginal bleeding, unspecified: Secondary | ICD-10-CM | POA: Insufficient documentation

## 2015-08-09 DIAGNOSIS — G8929 Other chronic pain: Secondary | ICD-10-CM | POA: Insufficient documentation

## 2015-08-09 DIAGNOSIS — R102 Pelvic and perineal pain: Secondary | ICD-10-CM | POA: Insufficient documentation

## 2015-08-09 DIAGNOSIS — Z01818 Encounter for other preprocedural examination: Secondary | ICD-10-CM

## 2015-08-09 NOTE — Progress Notes (Signed)
History and Physical      Carla Patterson:   Physician:  Duwaine MaxinBrandi K. Kanae Ignatowski, DO    This 35 y.o. female presents today for pre-operative evaluation.    History:  This 35 y.o. G0 P0000 patient has history of  AUB and pelvic pain.  Previous treatment includes: oral contraceptives.  She had an us with Dr. Elwyn LadeStoner in December that showed normal uterus but ovaries appeared to be touching uterus.  She has been having btb on ocps.  She has never been sexually active.  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  She has hyperactive thyroid and is followed by Dr. Wilkie AyeHorton.  Per pt report, she says he is fine for her to have surgery.      OB History     Gravida Para Term Preterm AB Living    0 0 0 0 0 0    SAB TAB Ectopic Molar Multiple Live Births    0 0 0  0           Past Medical History:   Diagnosis Date   ??? Cholelithiasis    ??? Depression    ??? Endometriosis    ??? H/O echocardiogram 10/30/2013    EF 62%   ??? Hashimoto's thyroiditis     Followed by Dr. Wilkie AyeHorton    ??? Hypertension     managed with medication    ??? Insomnia    ??? Left bundle branch block    ??? Nausea & vomiting     nausea    ??? Polycystic ovarian syndrome    ??? Primary hypothyroidism    ??? Restless leg syndrome 09/24/2013   ??? Systolic murmur     "benign and innocent" per Dr. Orvil FeilMoran-Faile's note on 11/04/13        has a past surgical history that includes wisdom teeth extraction; heent (age 7); and cholecystectomy.    Current Outpatient Prescriptions   Medication Sig Dispense   ??? SYNTHROID 125 mcg tablet Take 1 Tab by mouth Daily (before breakfast). 90 Tab   ??? ondansetron (ZOFRAN ODT) 8 mg disintegrating tablet Take 1 Tab by mouth every eight (8) hours as needed for Nausea. 30 Tab   ??? promethazine (PHENERGAN) 25 mg tablet Take 1 Tab by mouth every six (6) hours as needed for Nausea. 30 Tab   ??? HYDROcodone-acetaminophen (NORCO) 5-325 mg per tablet Take 1 Tab by mouth every six (6) hours as needed for Pain. Max Daily Amount: 4 Tabs. 90 Tab    ??? metaxalone (SKELAXIN) 800 mg tablet Take 800 mg by mouth three (3) times daily.    ??? MULTIVITAMIN PO Take  by mouth daily.    ??? VITAMIN B COMPLEX PO Take  by mouth daily.    ??? CHOLECALCIFEROL, VITAMIN D3, (VITAMIN D3 PO) Take 2,000 Units by mouth daily.    ??? ondansetron (ZOFRAN ODT) 4 mg disintegrating tablet Take 1 Tab by mouth every eight (8) hours as needed for Nausea for up to 12 doses. (Patient taking differently: Take 4 mg by mouth every eight (8) hours as needed for Nausea. Take DOS if needed per anesthesia protocol.) 12 Tab   ??? omeprazole (PRILOSEC) 20 mg capsule Take 20 mg by mouth daily. Take DOS per anesthesia protocol.    ??? promethazine (PHENERGAN) 25 mg tablet Take 1 Tab by mouth every eight (8) hours as needed for Nausea. 10 Tab   ??? L-Norgest&E Estradiol-E Estrad (DAYSEE) 0.15 mg-30 mcg (84)/10 mcg (7) 3MPk Take 1  Tab by mouth nightly. 1 Package   ??? enalapril (VASOTEC) 10 mg tablet Take 1 Tab by mouth daily. 30 Tab   ??? sertraline (ZOLOFT) 50 mg tablet Take 1 Tab by mouth daily. (Patient taking differently: Take 50 mg by mouth nightly.) 30 Tab   ??? temazepam (RESTORIL) 30 mg capsule Take 1 Cap by mouth nightly. Max Daily Amount: 30 mg. 30 Cap   ??? traMADol (ULTRAM) 50 mg tablet Take 1 Tab by mouth every six (6) hours as needed for Pain. Max Daily Amount: 200 mg. 30 Tab     No current facility-administered medications for this visit.        Allergies   Allergen Reactions   ??? Biaxin [Clarithromycin] Rash   .     reports that she has never smoked. She has never used smokeless tobacco. She reports that she drinks alcohol. She reports that she does not use illicit drugs.    family history includes Cancer in her father and maternal grandmother; Coronary Artery Disease in her paternal grandfather; Ovarian Cancer in her maternal grandmother; Suicide in her mother; Thyroid Disease in her maternal aunt, maternal grandmother, and mother.      Physical Exam:    Vitals:    08/09/15 1041   BP: 110/76    Weight: 160 lb (72.6 kg)       Patient without distress.  Heart: Regular rate and rhythm   Lung: clear to auscultation throughout lung fields, no wheezes, no rales, no rhonchi and normal respiratory effort  Abdomen: soft, nontender  Lower Extremities:  - Edema No    Findings/Diagnosis:   Pre-Op Evaluation done today.  abnormal uterine bleeding and chronic pelvic pain     Surgery to be Performed:  laparoscopy with possible laser ablation of endometriosis  Surgery Date:  08/24/15 @ 9:00am  Surgery Place:  [facility]  Surgery Duration:  1.5 hour  Surgery Type:  Assistant Surgeon:  Dr. Jason Hood    The risks of surgery were discussed in detail, including anesthesia, hemorrhage, blood clots, infection, damage to other organs such as bowel, bladder, ureters, need for laparotomy.   Also the possible need for catheter use at home after surgery.  Time away from work and physical restrictions discussed.     All of the patient's questions/concerns were answered.  She was encouraged to call me if she has additional questions/concerns prior to surgery.    Pt understands & states she wants to proceed w/surgery.  She had said that if "things were bad" go ahead and proceed with surgery.  Discussed at first visit and day of preop visit that she would not have hysterectomy same day - different recovery, potential use of cell saver, etc.  She is aware.

## 2015-08-09 NOTE — H&P (Signed)
History and Physical      Carla Patterson:   Physician:  Carla MaxinBrandi K. Kanae Ignatowski, DO    This 35 y.o. female presents today for pre-operative evaluation.    History:  This 35 y.o. G0 P0000 patient has history of  AUB and pelvic pain.  Previous treatment includes: oral contraceptives.  She had an us with Dr. Elwyn Patterson in December that showed normal uterus but ovaries appeared to be touching uterus.  She has been having btb on ocps.  She has never been sexually active.  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  She has hyperactive thyroid and is followed by Dr. Wilkie Patterson.  Per pt report, she says he is fine for her to have surgery.      OB History     Gravida Para Term Preterm AB Living    0 0 0 0 0 0    SAB TAB Ectopic Molar Multiple Live Births    0 0 0  0           Past Medical History:   Diagnosis Date   ??? Cholelithiasis    ??? Depression    ??? Endometriosis    ??? H/O echocardiogram 10/30/2013    EF 62%   ??? Hashimoto's thyroiditis     Followed by Dr. Wilkie Patterson    ??? Hypertension     managed with medication    ??? Insomnia    ??? Left bundle branch block    ??? Nausea & vomiting     nausea    ??? Polycystic ovarian syndrome    ??? Primary hypothyroidism    ??? Restless leg syndrome 09/24/2013   ??? Systolic murmur     "benign and innocent" per Carla Patterson's note on 11/04/13        has a past surgical history that includes wisdom teeth extraction; heent (age 7); and cholecystectomy.    Current Outpatient Prescriptions   Medication Sig Dispense   ??? SYNTHROID 125 mcg tablet Take 1 Tab by mouth Daily (before breakfast). 90 Tab   ??? ondansetron (ZOFRAN ODT) 8 mg disintegrating tablet Take 1 Tab by mouth every eight (8) hours as needed for Nausea. 30 Tab   ??? promethazine (PHENERGAN) 25 mg tablet Take 1 Tab by mouth every six (6) hours as needed for Nausea. 30 Tab   ??? HYDROcodone-acetaminophen (NORCO) 5-325 mg per tablet Take 1 Tab by mouth every six (6) hours as needed for Pain. Max Daily Amount: 4 Tabs. 90 Tab    ??? metaxalone (SKELAXIN) 800 mg tablet Take 800 mg by mouth three (3) times daily.    ??? MULTIVITAMIN PO Take  by mouth daily.    ??? VITAMIN B COMPLEX PO Take  by mouth daily.    ??? CHOLECALCIFEROL, VITAMIN D3, (VITAMIN D3 PO) Take 2,000 Units by mouth daily.    ??? ondansetron (ZOFRAN ODT) 4 mg disintegrating tablet Take 1 Tab by mouth every eight (8) hours as needed for Nausea for up to 12 doses. (Patient taking differently: Take 4 mg by mouth every eight (8) hours as needed for Nausea. Take DOS if needed per anesthesia protocol.) 12 Tab   ??? omeprazole (PRILOSEC) 20 mg capsule Take 20 mg by mouth daily. Take DOS per anesthesia protocol.    ??? promethazine (PHENERGAN) 25 mg tablet Take 1 Tab by mouth every eight (8) hours as needed for Nausea. 10 Tab   ??? L-Norgest&E Estradiol-E Estrad (DAYSEE) 0.15 mg-30 mcg (84)/10 mcg (7) 3MPk Take 1  Tab by mouth nightly. 1 Package   ??? enalapril (VASOTEC) 10 mg tablet Take 1 Tab by mouth daily. 30 Tab   ??? sertraline (ZOLOFT) 50 mg tablet Take 1 Tab by mouth daily. (Patient taking differently: Take 50 mg by mouth nightly.) 30 Tab   ??? temazepam (RESTORIL) 30 mg capsule Take 1 Cap by mouth nightly. Max Daily Amount: 30 mg. 30 Cap   ??? traMADol (ULTRAM) 50 mg tablet Take 1 Tab by mouth every six (6) hours as needed for Pain. Max Daily Amount: 200 mg. 30 Tab     No current facility-administered medications for this visit.        Allergies   Allergen Reactions   ??? Biaxin [Clarithromycin] Rash   .     reports that she has never smoked. She has never used smokeless tobacco. She reports that she drinks alcohol. She reports that she does not use illicit drugs.    family history includes Cancer in her father and maternal grandmother; Coronary Artery Disease in her paternal grandfather; Ovarian Cancer in her maternal grandmother; Suicide in her mother; Thyroid Disease in her maternal aunt, maternal grandmother, and mother.      Physical Exam:    Vitals:    08/09/15 1041   BP: 110/76    Weight: 160 lb (72.6 kg)       Patient without distress.  Heart: Regular rate and rhythm   Lung: clear to auscultation throughout lung fields, no wheezes, no rales, no rhonchi and normal respiratory effort  Abdomen: soft, nontender  Lower Extremities:  - Edema No    Findings/Diagnosis:   Pre-Op Evaluation done today.  abnormal uterine bleeding and chronic pelvic pain     Surgery to be Performed:  laparoscopy with possible laser ablation of endometriosis  Surgery Date:  08/24/15 @ 9:00am  Surgery Place:  [facility]  Surgery Duration:  1.5 hour  Surgery Type:  Assistant Surgeon:  Dr. Reynold Bowen    The risks of surgery were discussed in detail, including anesthesia, hemorrhage, blood clots, infection, damage to other organs such as bowel, bladder, ureters, need for laparotomy.   Also the possible need for catheter use at home after surgery.  Time away from work and physical restrictions discussed.     All of the patient's questions/concerns were answered.  She was encouraged to call me if she has additional questions/concerns prior to surgery.    Pt understands & states she wants to proceed w/surgery.  She had said that if "things were bad" go ahead and proceed with surgery.  Discussed at first visit and day of preop visit that she would not have hysterectomy same day - different recovery, potential use of cell saver, etc.  She is aware.

## 2015-08-15 MED ORDER — SERTRALINE 100 MG TAB
100 mg | ORAL_TABLET | Freq: Every day | ORAL | 3 refills | Status: DC
Start: 2015-08-15 — End: 2016-01-20

## 2015-08-15 NOTE — Telephone Encounter (Signed)
Okay to increase to 100mg  daily.  I will send a new prescription for her.

## 2015-08-15 NOTE — Telephone Encounter (Signed)
Pt would like to increase her Zoloft . She is currently on 50 mg daily.  Advise.

## 2015-08-17 NOTE — Other (Signed)
Printed Echo, last PCP and endcrinologist office visit on chart for anesthesia reference.

## 2015-08-17 NOTE — Other (Signed)
Patient verified name, DOB, and surgery as listed in [facility].     Type 2 surgery, PAT phone assessment complete.  Orders in CC.     Labs per surgeon: none   Labs per anesthesia protocol: Hgb and HCG the DOS.    Pt. Instructed to come to entrance B- Mon-Fri from 8am-5pm to have Hgb drawn prior to surgery.       Patient answered medical/surgical history questions at their best of ability. All prior to admission medications documented in [facility].    Patient instructed to take the following medications the day of surgery according to anesthesia guidelines with a small sip of water: Zofran prn, zoloft, synthroid . Hold all vitamins 7 days prior to surgery and NSAIDS 5 days prior to surgery. Medications to be held: Vitamins.    Patient instructed on the following and verbalizes understanding:  Arrive at [facility], time of arrival to be called the day before by 1700  NPO after midnight including gum, mints, and ice chips  Responsible adult must drive patient to the hospital, stay during surgery, and patient will  need supervision 24 hours after anesthesia  Use antibacterial soap in shower the night before surgery and on the morning of surgery  Leave all valuables(money and jewelry) at home but bring insurance card and ID on DOS  Do not wear make-up, nail polish, lotions, cologne, perfumes, powders, or oil on skin.

## 2015-08-18 ENCOUNTER — Inpatient Hospital Stay: Primary: Family Medicine

## 2015-08-22 ENCOUNTER — Inpatient Hospital Stay: Admit: 2015-08-22 | Payer: BLUE CROSS/BLUE SHIELD | Attending: Obstetrics & Gynecology | Primary: Family Medicine

## 2015-08-22 LAB — HEMOGLOBIN: HGB: 13.2 g/dL (ref 11.7–15.4)

## 2015-08-22 NOTE — Other (Signed)
hgb done today wnl    Recent Results (from the past 12 hour(s))   HEMOGLOBIN    Collection Time: 08/22/15  1:00 PM   Result Value Ref Range    HGB 13.2 11.7 - 15.4 g/dL

## 2015-08-23 HISTORY — PX: LAPAROSCOPIC ENDOMETRIOSIS FULGURATION: SUR769

## 2015-08-23 HISTORY — PX: ENDOMETRIAL ABLATION: SHX621

## 2015-08-24 ENCOUNTER — Inpatient Hospital Stay: Payer: BLUE CROSS/BLUE SHIELD

## 2015-08-24 LAB — HCG URINE, QL. - POC: Pregnancy test,urine (POC): NEGATIVE

## 2015-08-24 MED ORDER — GLYCOPYRROLATE 0.2 MG/ML IJ SOLN
0.2 mg/mL | INTRAMUSCULAR | Status: DC | PRN
Start: 2015-08-24 — End: 2015-08-24
  Administered 2015-08-24: 14:00:00 via INTRAVENOUS

## 2015-08-24 MED ORDER — VECURONIUM BROMIDE 10 MG IV SOLR
10 mg | INTRAVENOUS | Status: DC | PRN
Start: 2015-08-24 — End: 2015-08-24
  Administered 2015-08-24 (×3): via INTRAVENOUS

## 2015-08-24 MED ORDER — PROMETHAZINE 25 MG TAB
25 mg | ORAL_TABLET | Freq: Four times a day (QID) | ORAL | 0 refills | Status: DC | PRN
Start: 2015-08-24 — End: 2016-04-24

## 2015-08-24 MED ORDER — LIDOCAINE HCL 1 % (10 MG/ML) IJ SOLN
10 mg/mL (1 %) | INTRAMUSCULAR | Status: DC | PRN
Start: 2015-08-24 — End: 2015-08-24

## 2015-08-24 MED ORDER — SODIUM CHLORIDE 0.9 % IJ SYRG
Freq: Three times a day (TID) | INTRAMUSCULAR | Status: DC
Start: 2015-08-24 — End: 2015-08-24

## 2015-08-24 MED ORDER — HYDROMORPHONE (PF) 2 MG/ML IJ SOLN
2 mg/mL | INTRAMUSCULAR | Status: DC | PRN
Start: 2015-08-24 — End: 2015-08-24
  Administered 2015-08-24: 14:00:00 via INTRAVENOUS

## 2015-08-24 MED ORDER — ONDANSETRON HCL 4 MG TAB
4 mg | ORAL_TABLET | Freq: Three times a day (TID) | ORAL | 0 refills | Status: DC | PRN
Start: 2015-08-24 — End: 2016-04-24

## 2015-08-24 MED ORDER — SUGAMMADEX 100 MG/ML INTRAVENOUS SOLUTION
100 mg/mL | INTRAVENOUS | Status: AC
Start: 2015-08-24 — End: ?

## 2015-08-24 MED ORDER — HYDROCODONE-ACETAMINOPHEN 5 MG-325 MG TAB
5-325 mg | ORAL | Status: DC | PRN
Start: 2015-08-24 — End: 2015-08-24

## 2015-08-24 MED ORDER — OXYCODONE 5 MG TAB
5 mg | Freq: Once | ORAL | Status: DC | PRN
Start: 2015-08-24 — End: 2015-08-24
  Administered 2015-08-24: 15:00:00 via ORAL

## 2015-08-24 MED ORDER — FENTANYL CITRATE (PF) 50 MCG/ML IJ SOLN
50 mcg/mL | INTRAMUSCULAR | Status: AC
Start: 2015-08-24 — End: ?

## 2015-08-24 MED ORDER — DEXAMETHASONE SODIUM PHOSPHATE 4 MG/ML IJ SOLN
4 mg/mL | INTRAMUSCULAR | Status: DC | PRN
Start: 2015-08-24 — End: 2015-08-24
  Administered 2015-08-24: 13:00:00 via INTRAVENOUS

## 2015-08-24 MED ORDER — KETOROLAC TROMETHAMINE 30 MG/ML INJECTION
30 mg/mL (1 mL) | INTRAMUSCULAR | Status: DC | PRN
Start: 2015-08-24 — End: 2015-08-24
  Administered 2015-08-24: 14:00:00 via INTRAVENOUS

## 2015-08-24 MED ORDER — SUGAMMADEX 100 MG/ML INTRAVENOUS SOLUTION
100 mg/mL | INTRAVENOUS | Status: DC | PRN
Start: 2015-08-24 — End: 2015-08-24
  Administered 2015-08-24: 14:00:00 via INTRAVENOUS

## 2015-08-24 MED ORDER — FAMOTIDINE 20 MG TAB
20 mg | Freq: Once | ORAL | Status: AC
Start: 2015-08-24 — End: 2015-08-24
  Administered 2015-08-24: 12:00:00 via ORAL

## 2015-08-24 MED ORDER — PROPOFOL 10 MG/ML IV EMUL
10 mg/mL | INTRAVENOUS | Status: DC | PRN
Start: 2015-08-24 — End: 2015-08-24
  Administered 2015-08-24: 13:00:00 via INTRAVENOUS

## 2015-08-24 MED ORDER — MIDAZOLAM 1 MG/ML IJ SOLN
1 mg/mL | Freq: Once | INTRAMUSCULAR | Status: DC | PRN
Start: 2015-08-24 — End: 2015-08-24
  Administered 2015-08-24: 12:00:00 via INTRAVENOUS

## 2015-08-24 MED ORDER — FENTANYL CITRATE (PF) 50 MCG/ML IJ SOLN
50 mcg/mL | INTRAMUSCULAR | Status: DC | PRN
Start: 2015-08-24 — End: 2015-08-24
  Administered 2015-08-24: 13:00:00 via INTRAVENOUS

## 2015-08-24 MED ORDER — NEOSTIGMINE METHYLSULFATE 1 MG/ML INJECTION
1 mg/mL | INTRAMUSCULAR | Status: DC | PRN
Start: 2015-08-24 — End: 2015-08-24
  Administered 2015-08-24 (×2): via INTRAVENOUS

## 2015-08-24 MED ORDER — OXYCODONE-ACETAMINOPHEN 7.5 MG-325 MG TAB
ORAL_TABLET | ORAL | 0 refills | Status: DC
Start: 2015-08-24 — End: 2016-03-06

## 2015-08-24 MED ORDER — LIDOCAINE (PF) 20 MG/ML (2 %) IJ SOLN
20 mg/mL (2 %) | INTRAMUSCULAR | Status: DC | PRN
Start: 2015-08-24 — End: 2015-08-24
  Administered 2015-08-24: 13:00:00 via INTRAVENOUS

## 2015-08-24 MED ORDER — LACTATED RINGERS IV
INTRAVENOUS | Status: DC
Start: 2015-08-24 — End: 2015-08-24

## 2015-08-24 MED ORDER — ONDANSETRON (PF) 4 MG/2 ML INJECTION
4 mg/2 mL | INTRAMUSCULAR | Status: DC | PRN
Start: 2015-08-24 — End: 2015-08-24
  Administered 2015-08-24: 13:00:00 via INTRAVENOUS

## 2015-08-24 MED ORDER — LACTATED RINGERS IV
INTRAVENOUS | Status: DC
Start: 2015-08-24 — End: 2015-08-24
  Administered 2015-08-24 (×2): via INTRAVENOUS

## 2015-08-24 MED ORDER — SODIUM CHLORIDE 0.9 % IJ SYRG
INTRAMUSCULAR | Status: DC | PRN
Start: 2015-08-24 — End: 2015-08-24

## 2015-08-24 MED FILL — XYLOCAINE-MPF 20 MG/ML (2 %) INJECTION SOLUTION: 20 mg/mL (2 %) | INTRAMUSCULAR | Qty: 100

## 2015-08-24 MED FILL — MIDAZOLAM 1 MG/ML IJ SOLN: 1 mg/mL | INTRAMUSCULAR | Qty: 2

## 2015-08-24 MED FILL — FENTANYL CITRATE (PF) 50 MCG/ML IJ SOLN: 50 mcg/mL | INTRAMUSCULAR | Qty: 2

## 2015-08-24 MED FILL — HYDROMORPHONE (PF) 2 MG/ML IJ SOLN: 2 mg/mL | INTRAMUSCULAR | Qty: 1

## 2015-08-24 MED FILL — NEOSTIGMINE METHYLSULFATE 3 MG/3 ML (1 MG/ML) IV SYRINGE: 3 mg/ mL (1 mg/mL) | INTRAVENOUS | Qty: 5

## 2015-08-24 MED FILL — OXYCODONE 5 MG TAB: 5 mg | ORAL | Qty: 1

## 2015-08-24 MED FILL — GLYCOPYRROLATE 0.2 MG/ML IJ SOLN: 0.2 mg/mL | INTRAMUSCULAR | Qty: 0.6

## 2015-08-24 MED FILL — KETOROLAC TROMETHAMINE 30 MG/ML INJECTION: 30 mg/mL (1 mL) | INTRAMUSCULAR | Qty: 1

## 2015-08-24 MED FILL — ONDANSETRON (PF) 4 MG/2 ML INJECTION: 4 mg/2 mL | INTRAMUSCULAR | Qty: 4

## 2015-08-24 MED FILL — VECURONIUM BROMIDE 10 MG IV SOLR: 10 mg | INTRAVENOUS | Qty: 4

## 2015-08-24 MED FILL — DEXAMETHASONE SODIUM PHOSPHATE 4 MG/ML IJ SOLN: 4 mg/mL | INTRAMUSCULAR | Qty: 4

## 2015-08-24 MED FILL — BRIDION 100 MG/ML INTRAVENOUS SOLUTION: 100 mg/mL | INTRAVENOUS | Qty: 160

## 2015-08-24 MED FILL — FAMOTIDINE 20 MG TAB: 20 mg | ORAL | Qty: 1

## 2015-08-24 MED FILL — PROPOFOL 10 MG/ML IV EMUL: 10 mg/mL | INTRAVENOUS | Qty: 200

## 2015-08-24 MED FILL — BRIDION 100 MG/ML INTRAVENOUS SOLUTION: 100 mg/mL | INTRAVENOUS | Qty: 5

## 2015-08-24 NOTE — Op Note (Signed)
Operative Report    Date of Surgery: 08/24/2015     Preoperative Diagnosis: Pelvic pain in female [R10.2]  Abnormal uterine bleeding [N93.9]     Postoperative Diagnosis: Pelvic pain in female [R10.2]  Abnormal uterine bleeding [N93.9]  Endometriosis     Surgeon(s) and Role:     * Malcolm Metro Sherre Wooton, DO - Primary     * Vale Haven, MD - Assisting    Anesthesia: General     Intravenous Fluids:  1650cc    Urinary Output:  100 cc clear yellow  Procedure: Procedure(s):  GYN LAPAROSCOPY WITH ABLATION OF ENDOMETRIOSIS     Estimated Blood Loss:   less than 5 cc    Specimens: * No specimens in log *      Findings: endometriosis and boggy uterus wtih appearance consistent with endometriosis.  Normal liver edge and appendix.  Purple bleb of endometriosis on superior aspect of left ovary.  Normal ovaries and tubes.  Hemosiderin stain of uterosacral ligament on left side.  Small peritoneal window on right pelvic sidewall.  White scarring seen on right pelvic sidewall.  Right sided ureter was seen peristalsing over pelvic brim.      Procedure in Detail:   After thorough counseling and consent, the patient was taken to operative suite where she was placed in the supine position and underwent general endotracheal anesthesia without incident. She was placed in the low lithotomy position using the Allen stirrups. She was prepped with Betadine solution on the abdomen and vagina and was draped in the usual sterile fashion. A Foley catheter was placed transurethrally to bag drainage. A speculum was placed in the vagina. The cervix was visualized and grasped with a single-tooth tenaculum and sounded to 10cm . Cervical dilation was performed with Hanks dilators to accommodate a HUMI uterine manipulator, which was placed transcervically into the uterine cavity and balloon inflated.  Next, the surgeon's gloves were changed and attention was turned to the abdomen where a diagnostic laparoscopy was performed. An umbilical 32mm incision  was made at the base of the umbilicus and verese needle inserted with aspiration of clear fluid.  Opening pressured was noted to be .   Co2 gas was allowed to insufflated the abdominal cavity.  She was placed in steep trendelenburg.  Next 2 lower quadrant 24mm ports were placed under direct visualization.   Intraabdominal contents were visualized.   Next the argon laser was introduced with laser of endometriosis on superior aspect of left ovary and posterior culdesac.  Next laser was performed higher on right pelvic sidewall well away from ureter.   At this point, the procedure was over.  All ports were removed under direct visualization.  Bleeding was hemostatic with pressure reduced. The skin was closed with Dermabond.  The HUMI uterine manipulator was removed. The patient was awakened from general endotracheal anesthesia and transferred to the Postanesthesia Care Unit in good condition. She tolerated the procedure well.     Signed By:  Roosvelt Maser, DO     August 24, 2015

## 2015-08-24 NOTE — Anesthesia Pre-Procedure Evaluation (Addendum)
Anesthetic History   No history of anesthetic complications            Review of Systems / Medical History  Patient summary reviewed and pertinent labs reviewed    Pulmonary  Within defined limits                 Neuro/Psych   Within defined limits           Cardiovascular    Hypertension: well controlled              Exercise tolerance: >4 METS     GI/Hepatic/Renal     GERD: well controlled           Endo/Other      Hypothyroidism: well controlled       Other Findings            Physical Exam    Airway  Mallampati: II  TM Distance: 4 - 6 cm  Neck ROM: normal range of motion   Mouth opening: Normal     Cardiovascular  Regular rate and rhythm,  S1 and S2 normal,  no murmur, click, rub, or gallop             Dental  No notable dental hx       Pulmonary  Breath sounds clear to auscultation               Abdominal  GI exam deferred       Other Findings            Anesthetic Plan    ASA: 2  Anesthesia type: general          Induction: Intravenous  Anesthetic plan and risks discussed with: Patient

## 2015-08-24 NOTE — Op Note (Signed)
Operative Report    Date of Surgery: 08/24/2015     Preoperative Diagnosis: Pelvic pain in female [R10.2]  Abnormal uterine bleeding [N93.9]     Postoperative Diagnosis: Pelvic pain in female [R10.2]  Abnormal uterine bleeding [N93.9]  Endometriosis     Surgeon(s) and Role:     * Malcolm Metro Jaline Pincock, DO - Primary     * Vale Haven, MD - Assisting    Anesthesia: General     Intravenous Fluids:  1650cc    Urinary Output:  100 cc clear yellow  Procedure: Procedure(s):  GYN LAPAROSCOPY WITH ABLATION OF ENDOMETRIOSIS     Estimated Blood Loss:   less than 5 cc    Specimens: * No specimens in log *      Findings: endometriosis and boggy uterus wtih appearance consistent with endometriosis.  Normal liver edge and appendix.  Purple bleb of endometriosis on superior aspect of left ovary.  Normal ovaries and tubes.  Hemosiderin stain of uterosacral ligament on left side.  Small peritoneal window on right pelvic sidewall.  White scarring seen on right pelvic sidewall.  Right sided ureter was seen peristalsing over pelvic brim.      Procedure in Detail:   After thorough counseling and consent, the patient was taken to operative suite where she was placed in the supine position and underwent general endotracheal anesthesia without incident. She was placed in the low lithotomy position using the Allen stirrups. She was prepped with Betadine solution on the abdomen and vagina and was draped in the usual sterile fashion. A Foley catheter was placed transurethrally to bag drainage. A speculum was placed in the vagina. The cervix was visualized and grasped with a single-tooth tenaculum and sounded to 10cm . Cervical dilation was performed with Hanks dilators to accommodate a HUMI uterine manipulator, which was placed transcervically into the uterine cavity and balloon inflated.  Next, the surgeon's gloves were changed and attention was turned to the abdomen where a diagnostic laparoscopy was performed. An  umbilical 25mm incision was made at the base of the umbilicus and verese needle inserted with aspiration of clear fluid.  Opening pressured was noted to be .   Co2 gas was allowed to insufflated the abdominal cavity.  She was placed in steep trendelenburg.  Next 2 lower quadrant 9mm ports were placed under direct visualization.   Intraabdominal contents were visualized.   Next the argon laser was introduced with laser of endometriosis on superior aspect of left ovary and posterior culdesac.  Next laser was performed higher on right pelvic sidewall well away from ureter.   At this point, the procedure was over.  All ports were removed under direct visualization.  Bleeding was hemostatic with pressure reduced. The skin was closed with Dermabond.  The HUMI uterine manipulator was removed. The patient was awakened from general endotracheal anesthesia and transferred to the Postanesthesia Care Unit in good condition. She tolerated the procedure well.     Signed By:  Roosvelt Maser, DO     August 24, 2015

## 2015-08-24 NOTE — Anesthesia Post-Procedure Evaluation (Signed)
Post-Anesthesia Evaluation and Assessment    Patient: Carla Patterson MRN: [id number]  SSN: [id number]    Date of Birth: 1980-02-20  Age: 35 y.o.  Sex: female       Cardiovascular Function/Vital Signs  Visit Vitals   ??? BP 108/65   ??? Pulse 76   ??? Temp 36.4 ??C (97.6 ??F)   ??? Resp 15   ??? Ht 5' (1.524 m)   ??? Wt 73.5 kg (162 lb)   ??? SpO2 97%   ??? BMI 31.64 kg/m2       Patient is status post general anesthesia for Procedure(s):  GYN LAPAROSCOPY WITH ABLATION OF ENDOMETRIOSIS.    Nausea/Vomiting: None    Postoperative hydration reviewed and adequate.    Pain:  Pain Scale 1: Numeric (0 - 10) (08/24/15 1042)  Pain Intensity 1: 0 (08/24/15 1042)   Managed    Neurological Status:   Neuro (WDL): Exceptions to WDL (08/24/15 1007)  Neuro  Neurologic State: Drowsy (08/24/15 1007)  Cognition: Follows commands (08/24/15 1007)  LUE Motor Response: Purposeful (08/24/15 1007)  LLE Motor Response: Purposeful (08/24/15 1007)  RUE Motor Response: Purposeful (08/24/15 1007)  RLE Motor Response: Purposeful (08/24/15 1007)   At baseline    Mental Status and Level of Consciousness: Arousable    Pulmonary Status:   O2 Device: Room air (08/24/15 1042)   Adequate oxygenation and airway patent    Complications related to anesthesia: None    Post-anesthesia assessment completed. No concerns    Signed By: Derry Lory., MD     August 24, 2015

## 2015-09-05 ENCOUNTER — Ambulatory Visit
Admit: 2015-09-05 | Discharge: 2015-09-05 | Payer: BLUE CROSS/BLUE SHIELD | Attending: Obstetrics & Gynecology | Primary: Family Medicine

## 2015-09-05 DIAGNOSIS — N939 Abnormal uterine and vaginal bleeding, unspecified: Secondary | ICD-10-CM

## 2015-09-05 LAB — AMB POC HEMOGLOBIN (HGB): Hemoglobin (POC): 1406

## 2015-09-05 MED ORDER — NORETHINDRONE-ETHINYL ESTRADIOL-IRON 1.5 MG-30 MCG TAB
1.5 mg-30 mcg (21)/75 mg (7) | PACK | Freq: Every day | ORAL | 11 refills | Status: AC
Start: 2015-09-05 — End: ?

## 2015-09-05 NOTE — Progress Notes (Signed)
Patient presents today for a post-op visit.  She is 1 week 5 days from Laparoscopy WITH ABLATION OF ENDOMETRIOSIS .  She is tolerating a regular diet, passing gas and her pain is under good control.  She denies signs of infection. She complains of heavy bleeding. Her hgb today was 14.6.  She feels a lot better.  She had mild endometriosis.      Shared surgical pictures with pt.      Visit Vitals   ??? BP 122/86   ??? Wt 158 lb (71.7 kg)   ??? LMP 08/16/2015   ??? BMI 30.86 kg/m2       General appearance: no distress  Respiratory: clear to auscultation bilaterally  Cardiovascular: regular heart rate, no murmurs  Abdomen: soft, non-tender, bowel sounds present.  Port sites clean /dry and intact.    Extremities: no calf pain or tenderness. No redness.   Neurologic: alert and oriented, cranial nerves grossly intact    Plan: Patient is discharged from further care for this condition; patient will return to clinic in one year for gynecological examination unless new problems arise.  Discussed that I am hoping she remains symptom free for a while.  rtc 1 year or prn for annual.  Will call in loestrin 1.5 /30 /fe.  She is aware.  Reassured her that hg is good.

## 2015-09-29 NOTE — Telephone Encounter (Signed)
Pt called requesting a refill on her ultram for her painful cycles.     Pt is aware that Dr Steva Ready is out of office

## 2015-11-04 ENCOUNTER — Encounter

## 2015-11-04 MED ORDER — TEMAZEPAM 30 MG CAP
30 mg | ORAL_CAPSULE | Freq: Every evening | ORAL | 5 refills | Status: DC
Start: 2015-11-04 — End: 2016-05-25

## 2015-11-08 ENCOUNTER — Ambulatory Visit
Admit: 2015-11-08 | Discharge: 2015-11-08 | Payer: BLUE CROSS/BLUE SHIELD | Attending: "Endocrinology | Primary: Family Medicine

## 2015-11-08 DIAGNOSIS — E039 Hypothyroidism, unspecified: Secondary | ICD-10-CM

## 2015-11-08 LAB — TSH 3RD GENERATION: TSH: 0.107 u[IU]/mL — ABNORMAL LOW (ref 0.450–4.500)

## 2015-11-08 LAB — T4, FREE: T4, Free: 1.05 ng/dL (ref 0.82–1.77)

## 2015-11-08 MED ORDER — SYNTHROID 112 MCG TABLET
112 mcg | ORAL_TABLET | Freq: Every day | ORAL | 11 refills | Status: DC
Start: 2015-11-08 — End: 2016-02-14
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
## 2015-12-05 ENCOUNTER — Encounter

## 2015-12-06 MED ORDER — TRAMADOL 50 MG TAB
50 mg | ORAL_TABLET | Freq: Four times a day (QID) | ORAL | 5 refills | Status: DC | PRN
Start: 2015-12-06 — End: 2016-05-25

## 2016-01-05 ENCOUNTER — Encounter

## 2016-01-05 MED ORDER — ENALAPRIL MALEATE 10 MG TAB
10 mg | ORAL_TABLET | Freq: Every day | ORAL | 5 refills | Status: DC
Start: 2016-01-05 — End: 2016-05-25

## 2016-01-20 MED ORDER — SERTRALINE 100 MG TAB
100 mg | ORAL_TABLET | Freq: Every day | ORAL | 3 refills | Status: DC
Start: 2016-01-20 — End: 2016-05-25

## 2016-02-13 ENCOUNTER — Encounter: Primary: Family Medicine

## 2016-02-14 ENCOUNTER — Ambulatory Visit
Admit: 2016-02-14 | Discharge: 2016-02-14 | Payer: BLUE CROSS/BLUE SHIELD | Attending: "Endocrinology | Primary: Family Medicine

## 2016-02-14 DIAGNOSIS — E039 Hypothyroidism, unspecified: Secondary | ICD-10-CM

## 2016-02-14 LAB — TSH 3RD GENERATION: TSH: 1.42 u[IU]/mL (ref 0.450–4.500)

## 2016-02-14 LAB — T4, FREE: T4, Free: 0.98 ng/dL (ref 0.82–1.77)

## 2016-02-14 MED ORDER — SYNTHROID 112 MCG TABLET
112 mcg | ORAL_TABLET | Freq: Every day | ORAL | 11 refills | Status: DC
Start: 2016-02-14 — End: 2016-05-25
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
## 2016-03-06 ENCOUNTER — Ambulatory Visit
Admit: 2016-03-06 | Discharge: 2016-03-06 | Payer: BLUE CROSS/BLUE SHIELD | Attending: Physician Assistant | Primary: Family Medicine

## 2016-03-06 DIAGNOSIS — B079 Viral wart, unspecified: Secondary | ICD-10-CM

## 2016-03-06 NOTE — Patient Instructions (Signed)
Warts: Care Instructions  Your Care Instructions  A wart is a harmless skin growth caused by a virus. The virus makes the top layer of skin grow quickly, causing a wart. Warts usually go away on their own in months or years. There are several types of warts. Common warts appear most often on the hands, but they may be anywhere on the body. Plantar warts occur on the soles of the feet and may cause pain when you walk.  Warts spread easily. You can reinfect yourself by touching the wart and then touching another part of your body. You can infect others by sharing towels, razors, or other personal items.  Most warts do not need treatment and go away on their own. But if warts cause pain or spread, your doctor may recommend that you use an over-the-counter treatment. These include salicylic acid or duct tape. Or your doctor may prescribe a stronger medicine to put on warts or may inject them with medicine. The doctor also can remove warts through surgery or by freezing them.  Follow-up care is a key part of your treatment and safety. Be sure to make and go to all appointments, and call your doctor if you are having problems. It's also a good idea to know your test results and keep a list of the medicines you take.  How can you care for yourself at home?  For common warts  ?? Use salicylic acid or duct tape as your doctor directs. You put the medicine or the tape on a wart for several days and then file down the dead skin on the wart. You use the salicylic acid treatment for 2 to 3 months or the tape for 1 to 2 months.  ?? If your doctor prescribes medicine to put on warts, use it exactly as directed. Call your doctor if you think you are having a problem with your medicine.  For plantar (foot) warts  ?? Wear comfortable shoes and socks. Avoid high heels and shoes that put a lot of pressure on your foot.  ?? Pad the wart with doughnut-shaped felt or a moleskin patch. You can buy  these at a drugstore. Put the pad around the plantar wart so that it relieves pressure on the wart. You also can place pads or cushions in your shoes to make walking more comfortable.  ?? Take an over-the-counter pain medicine, such as acetaminophen (Tylenol), ibuprofen (Advil, Motrin), or naproxen (Aleve). Read and follow all instructions on the label.  ?? Do not take two or more pain medicines at the same time unless the doctor told you to. Many pain medicines have acetaminophen, which is Tylenol. Too much acetaminophen (Tylenol) can be harmful.  To avoid spreading warts  ?? Keep warts covered with a bandage or athletic tape.  ?? Do not bite your nails or cuticles. This may spread warts from one finger to another.  When should you call for help?  Call your doctor now or seek immediate medical care if:  ? ?? You have signs of infection, such as:  ?? Increased pain, swelling, warmth, or redness.  ?? Red streaks leading from a wart.  ?? Pus draining from a wart.  ?? A fever.   ?Watch closely for changes in your health, and be sure to contact your doctor if:  ? ?? You do not get better as expected.   Where can you learn more?  Go to http://www.healthwise.net/[employer].  Enter K886 in the search box to learn more about "  Warts: Care Instructions."  Current as of: November 04, 2014  Content Version: 11.4  ?? 2006-2017 [employer]. Care instructions adapted under license by [employer] (which disclaims liability or warranty for this information). If you have questions about a medical condition or this instruction, always ask your healthcare professional. [employer]wise[employer] Inc[employer] disclaims any warranty or liability for your use of this information.
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
## 2016-04-24 ENCOUNTER — Ambulatory Visit
Admit: 2016-04-24 | Discharge: 2016-04-24 | Payer: BLUE CROSS/BLUE SHIELD | Attending: Medical | Primary: Family Medicine

## 2016-04-24 DIAGNOSIS — R3915 Urgency of urination: Secondary | ICD-10-CM

## 2016-04-24 LAB — AMB POC URINALYSIS DIP STICK MANUAL W/ MICRO
Bilirubin (UA POC): NEGATIVE
Glucose (UA POC): NEGATIVE
Ketones (UA POC): NEGATIVE
Nitrites (UA POC): NEGATIVE
Protein (UA POC): NEGATIVE
Specific gravity (UA POC): 1.015 (ref 1.001–1.035)
Urobilinogen (UA POC): NORMAL (ref 0.2–1)
pH (UA POC): 5 (ref 4.6–8.0)

## 2016-04-24 MED ORDER — NITROFURANTOIN (25% MACROCRYSTAL FORM) 100 MG CAP
100 mg | ORAL_CAPSULE | Freq: Two times a day (BID) | ORAL | 0 refills | Status: AC
Start: 2016-04-24 — End: 2016-05-01

## 2016-04-24 MED ORDER — PHENAZOPYRIDINE 200 MG TAB
200 mg | ORAL_TABLET | Freq: Three times a day (TID) | ORAL | 0 refills | Status: AC | PRN
Start: 2016-04-24 — End: 2016-04-27

## 2016-04-24 MED ORDER — CYCLOBENZAPRINE 10 MG TAB
10 mg | ORAL_TABLET | Freq: Three times a day (TID) | ORAL | 0 refills | Status: DC | PRN
Start: 2016-04-24 — End: 2016-05-25

## 2016-04-24 MED ORDER — PROMETHAZINE 25 MG TAB
25 mg | ORAL_TABLET | Freq: Four times a day (QID) | ORAL | 0 refills | Status: DC | PRN
Start: 2016-04-24 — End: 2016-05-25
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
## 2016-04-26 LAB — CULTURE, URINE

## 2016-04-30 NOTE — Progress Notes (Signed)
Urine cuture is abnormal

## 2016-04-30 NOTE — Progress Notes (Signed)
Correction urine culture is normal - no abnormal growth return if symptoms do not resolve

## 2016-05-25 ENCOUNTER — Ambulatory Visit
Admit: 2016-05-25 | Discharge: 2016-05-25 | Payer: BLUE CROSS/BLUE SHIELD | Attending: Family Medicine | Primary: Family Medicine

## 2016-05-25 DIAGNOSIS — F325 Major depressive disorder, single episode, in full remission: Secondary | ICD-10-CM | POA: Insufficient documentation

## 2016-05-25 DIAGNOSIS — Z0000 Encounter for general adult medical examination without abnormal findings: Secondary | ICD-10-CM

## 2016-05-25 MED ORDER — CYCLOBENZAPRINE 10 MG TAB
10 mg | ORAL_TABLET | Freq: Three times a day (TID) | ORAL | 1 refills | Status: DC | PRN
Start: 2016-05-25 — End: 2016-11-13

## 2016-05-25 MED ORDER — ENALAPRIL MALEATE 10 MG TAB
10 mg | ORAL_TABLET | Freq: Every day | ORAL | 1 refills | Status: DC
Start: 2016-05-25 — End: 2016-12-26

## 2016-05-25 MED ORDER — SERTRALINE 100 MG TAB
100 mg | ORAL_TABLET | Freq: Every day | ORAL | 1 refills | Status: DC
Start: 2016-05-25 — End: 2016-11-13

## 2016-05-25 MED ORDER — ONDANSETRON 8 MG TAB, RAPID DISSOLVE
8 mg | ORAL_TABLET | Freq: Three times a day (TID) | ORAL | 5 refills | Status: AC | PRN
Start: 2016-05-25 — End: ?

## 2016-05-25 MED ORDER — TEMAZEPAM 30 MG CAP
30 mg | ORAL_CAPSULE | Freq: Every evening | ORAL | 5 refills | Status: DC
Start: 2016-05-25 — End: 2016-11-13

## 2016-05-25 MED ORDER — SYNTHROID 112 MCG TABLET
112 mcg | ORAL_TABLET | Freq: Every day | ORAL | 1 refills | Status: DC
Start: 2016-05-25 — End: 2016-12-07

## 2016-05-25 MED ORDER — PROMETHAZINE 25 MG TAB
25 mg | ORAL_TABLET | Freq: Four times a day (QID) | ORAL | 0 refills | Status: AC | PRN
Start: 2016-05-25 — End: ?

## 2016-05-25 MED ORDER — TRAMADOL 50 MG TAB
50 mg | ORAL_TABLET | Freq: Four times a day (QID) | ORAL | 5 refills | Status: AC | PRN
Start: 2016-05-25 — End: ?

## 2016-05-25 NOTE — ACP (Advance Care Planning) (Signed)
Patient has no ACP on file but reports they have one at home.  Advised the patient of the importance of bringing in a copy of their current ACP for us to to scan into the system.  Patient expresses understanding of this request.

## 2016-05-25 NOTE — Patient Instructions (Addendum)
Well Visit, Ages 38 to 50: Care Instructions  Your Care Instructions    Physical exams can help you stay healthy. Your doctor has checked your overall health and may have suggested ways to take good care of yourself. He or she also may have recommended tests. At home, you can help prevent illness with healthy eating, regular exercise, and other steps.  Follow-up care is a key part of your treatment and safety. Be sure to make and go to all appointments, and call your doctor if you are having problems. It's also a good idea to know your test results and keep a list of the medicines you take.  How can you care for yourself at home?  ?? Reach and stay at a healthy weight. This will lower your risk for many problems, such as obesity, diabetes, heart disease, and high blood pressure.  ?? Get at least 30 minutes of physical activity on most days of the week. Walking is a good choice. You also may want to do other activities, such as running, swimming, cycling, or playing tennis or team sports. Discuss any changes in your exercise program with your doctor.  ?? Do not smoke or allow others to smoke around you. If you need help quitting, talk to your doctor about stop-smoking programs and medicines. These can increase your chances of quitting for good.  ?? Talk to your doctor about whether you have any risk factors for sexually transmitted infections (STIs). Having one sex partner (who does not have STIs and does not have sex with anyone else) is a good way to avoid these infections.  ?? Use birth control if you do not want to have children at this time. Talk with your doctor about the choices available and what might be best for you.  ?? Protect your skin from too much sun. When you're outdoors from 10 a.m. to 4 p.m., stay in the shade or cover up with clothing and a hat with a wide brim. Wear sunglasses that block UV rays. Even when it's cloudy, put broad-spectrum sunscreen (SPF 30 or higher) on any exposed skin.   ?? See a dentist one or two times a year for checkups and to have your teeth cleaned.  ?? Wear a seat belt in the car.  ?? Drink alcohol in moderation, if at all. That means no more than 2 drinks a day for men and 1 drink a day for women.  Follow your doctor's advice about when to have certain tests. These tests can spot problems early.  For everyone  ?? Cholesterol. Have the fat (cholesterol) in your blood tested after age 20. Your doctor will tell you how often to have this done based on your age, family history, or other things that can increase your risk for heart disease.  ?? Blood pressure. Have your blood pressure checked during a routine doctor visit. Your doctor will tell you how often to check your blood pressure based on your age, your blood pressure results, and other factors.  ?? Vision. Talk with your doctor about how often to have a glaucoma test.  ?? Diabetes. Ask your doctor whether you should have tests for diabetes.  ?? Colon cancer. Have a test for colon cancer at age 38. You may have one of several tests. If you are younger than 50, you may need a test earlier if you have any risk factors. Risk factors include whether you already had a precancerous polyp removed from your colon or whether your   parent, brother, sister, or child has had colon cancer.  For women  ?? Breast exam and mammogram. Talk to your doctor about when you should have a clinical breast exam and a mammogram. Medical experts differ on whether and how often women under 38 should have these tests. Your doctor can help you decide what is right for you.  ?? Pap test and pelvic exam. Begin Pap tests at age 21. A Pap test is the best way to find cervical cancer. The test often is part of a pelvic exam. Ask how often to have this test.  ?? Tests for sexually transmitted infections (STIs). Ask whether you should have tests for STIs. You may be at risk if you have sex with more than one person, especially if your partners do not wear condoms.   For men  ?? Tests for sexually transmitted infections (STIs). Ask whether you should have tests for STIs. You may be at risk if you have sex with more than one person, especially if you do not wear a condom.  ?? Testicular cancer exam. Ask your doctor whether you should check your testicles regularly.  ?? Prostate exam. Talk to your doctor about whether you should have a blood test (called a PSA test) for prostate cancer. Experts differ on whether and when men should have this test. Some experts suggest it if you are older than 45 and are African-American or have a father or brother who got prostate cancer when he was younger than 65.  When should you call for help?  Watch closely for changes in your health, and be sure to contact your doctor if you have any problems or symptoms that concern you.  Where can you learn more?  Go to http://www.healthwise.net/[employer].  Enter P072 in the search box to learn more about "Well Visit, Ages 38 to 50: Care Instructions."  Current as of: Jun 03, 2015  Content Version: 11.4  ?? 2006-2017 [employer]. Care instructions adapted under license by [employer] (which disclaims liability or warranty for this information). If you have questions about a medical condition or this instruction, always ask your healthcare professional. [employer] disclaims any warranty or liability for your use of this information.
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
## 2016-05-26 LAB — METABOLIC PANEL, COMPREHENSIVE
A-G Ratio: 1.5 (ref 1.2–2.2)
ALT (SGPT): 13 IU/L (ref 0–32)
AST (SGOT): 14 IU/L (ref 0–40)
Albumin: 4.1 g/dL (ref 3.5–5.5)
Alk. phosphatase: 79 IU/L (ref 39–117)
BUN/Creatinine ratio: 16 (ref 9–23)
BUN: 12 mg/dL (ref 6–20)
Bilirubin, total: 0.3 mg/dL (ref 0.0–1.2)
CO2: 22 mmol/L (ref 18–29)
Calcium: 9.3 mg/dL (ref 8.7–10.2)
Chloride: 101 mmol/L (ref 96–106)
Creatinine: 0.77 mg/dL (ref 0.57–1.00)
GFR est AA: 114 mL/min/{1.73_m2} (ref >59)
GFR est non-AA: 99 mL/min/{1.73_m2} (ref >59)
GLOBULIN, TOTAL: 2.7 g/dL (ref 1.5–4.5)
Glucose: 87 mg/dL (ref 65–99)
Potassium: 4 mmol/L (ref 3.5–5.2)
Protein, total: 6.8 g/dL (ref 6.0–8.5)
Sodium: 140 mmol/L (ref 134–144)

## 2016-05-26 LAB — CBC WITH AUTOMATED DIFF
ABS. BASOPHILS: 0 10*3/uL (ref 0.0–0.2)
ABS. EOSINOPHILS: 0.1 10*3/uL (ref 0.0–0.4)
ABS. IMM. GRANS.: 0 10*3/uL (ref 0.0–0.1)
ABS. MONOCYTES: 0.4 10*3/uL (ref 0.1–0.9)
ABS. NEUTROPHILS: 5.9 10*3/uL (ref 1.4–7.0)
Abs Lymphocytes: 2.8 10*3/uL (ref 0.7–3.1)
BASOPHILS: 0 %
EOSINOPHILS: 1 %
HCT: 40.7 % (ref 34.0–46.6)
HGB: 13.4 g/dL (ref 11.1–15.9)
IMMATURE GRANULOCYTES: 0 %
Lymphocytes: 31 %
MCH: 28.9 pg (ref 26.6–33.0)
MCHC: 32.9 g/dL (ref 31.5–35.7)
MCV: 88 fL (ref 79–97)
MONOCYTES: 4 %
NEUTROPHILS: 64 %
PLATELET: 407 10*3/uL — ABNORMAL HIGH (ref 150–379)
RBC: 4.63 x10E6/uL (ref 3.77–5.28)
RDW: 13.4 % (ref 12.3–15.4)
WBC: 9.1 10*3/uL (ref 3.4–10.8)

## 2016-05-26 LAB — TSH 3RD GENERATION: TSH: 0.433 u[IU]/mL — ABNORMAL LOW (ref 0.450–4.500)

## 2016-05-26 LAB — LIPID PANEL
Cholesterol, total: 230 mg/dL — ABNORMAL HIGH (ref 100–199)
HDL Cholesterol: 37 mg/dL — ABNORMAL LOW (ref >39)
LDL, calculated: 151 mg/dL — ABNORMAL HIGH (ref 0–99)
Triglyceride: 208 mg/dL — ABNORMAL HIGH (ref 0–149)
VLDL, calculated: 42 mg/dL — ABNORMAL HIGH (ref 5–40)

## 2016-05-27 NOTE — Progress Notes (Signed)
Please let [name] know that her lab work was normal except for her thyroid.  It shows that she is taking too much medication.  She can discuss it with her endo at her next appointment.  She also has very elevated cholesterol.  She probably needs to think about taking cholesterol medication.

## 2016-05-29 NOTE — Progress Notes (Signed)
Pt notified. Pt does not want lipid meds.

## 2016-08-21 ENCOUNTER — Encounter: Attending: "Endocrinology | Primary: Family Medicine

## 2016-11-13 ENCOUNTER — Encounter

## 2016-11-13 MED ORDER — SERTRALINE 100 MG TAB
100 mg | ORAL_TABLET | Freq: Every day | ORAL | 0 refills | Status: DC
Start: 2016-11-13 — End: 2017-03-06

## 2016-11-13 MED ORDER — CYCLOBENZAPRINE 10 MG TAB
10 mg | ORAL_TABLET | Freq: Three times a day (TID) | ORAL | 0 refills | Status: AC | PRN
Start: 2016-11-13 — End: ?

## 2016-11-13 MED ORDER — TEMAZEPAM 30 MG CAP
30 mg | ORAL_CAPSULE | Freq: Every evening | ORAL | 3 refills | Status: DC
Start: 2016-11-13 — End: 2017-02-07

## 2016-12-07 ENCOUNTER — Telehealth

## 2016-12-07 MED ORDER — SYNTHROID 112 MCG TABLET
112 mcg | ORAL_TABLET | Freq: Every day | ORAL | 1 refills | Status: AC
Start: 2016-12-07 — End: ?

## 2016-12-07 NOTE — Telephone Encounter (Signed)
Patient notified that rx refill was sent to the pharmacy and encourage to keep appointment in March.

## 2016-12-07 NOTE — Telephone Encounter (Signed)
Pt called and scheduled an appt in March, states she won't have insurance until February, pt requests a refill of synthroid to cover until that appointment.

## 2016-12-26 ENCOUNTER — Encounter

## 2016-12-27 MED ORDER — ENALAPRIL MALEATE 10 MG TAB
10 mg | ORAL_TABLET | ORAL | 0 refills | Status: DC
Start: 2016-12-27 — End: 2017-03-06

## 2017-02-07 ENCOUNTER — Encounter

## 2017-02-07 MED ORDER — TEMAZEPAM 30 MG CAP
30 mg | ORAL_CAPSULE | Freq: Every evening | ORAL | 0 refills | Status: DC
Start: 2017-02-07 — End: 2017-03-06

## 2017-03-06 ENCOUNTER — Encounter

## 2017-03-06 MED ORDER — SERTRALINE 100 MG TAB
100 mg | ORAL_TABLET | Freq: Every day | ORAL | 0 refills | Status: AC
Start: 2017-03-06 — End: ?

## 2017-03-06 MED ORDER — ENALAPRIL MALEATE 10 MG TAB
10 mg | ORAL_TABLET | ORAL | 0 refills | Status: AC
Start: 2017-03-06 — End: ?

## 2017-03-06 MED ORDER — TEMAZEPAM 30 MG CAP
30 mg | ORAL_CAPSULE | Freq: Every evening | ORAL | 0 refills | Status: AC
Start: 2017-03-06 — End: ?

## 2017-03-18 ENCOUNTER — Encounter: Payer: Self-pay | Admitting: Family

## 2017-03-18 ENCOUNTER — Ambulatory Visit: Payer: BLUE CROSS/BLUE SHIELD | Admitting: Family

## 2017-03-18 VITALS — BP 137/88 | HR 76 | Temp 98.0°F | Ht 58.5 in | Wt 166.8 lb

## 2017-03-18 DIAGNOSIS — G2581 Restless legs syndrome: Secondary | ICD-10-CM | POA: Diagnosis not present

## 2017-03-18 DIAGNOSIS — E785 Hyperlipidemia, unspecified: Secondary | ICD-10-CM | POA: Diagnosis not present

## 2017-03-18 DIAGNOSIS — N809 Endometriosis, unspecified: Secondary | ICD-10-CM

## 2017-03-18 DIAGNOSIS — I447 Left bundle-branch block, unspecified: Secondary | ICD-10-CM | POA: Diagnosis not present

## 2017-03-18 DIAGNOSIS — E039 Hypothyroidism, unspecified: Secondary | ICD-10-CM

## 2017-03-18 DIAGNOSIS — F325 Major depressive disorder, single episode, in full remission: Secondary | ICD-10-CM

## 2017-03-18 DIAGNOSIS — M545 Low back pain, unspecified: Secondary | ICD-10-CM

## 2017-03-18 DIAGNOSIS — E063 Autoimmune thyroiditis: Secondary | ICD-10-CM

## 2017-03-18 DIAGNOSIS — E669 Obesity, unspecified: Secondary | ICD-10-CM

## 2017-03-18 DIAGNOSIS — G4700 Insomnia, unspecified: Secondary | ICD-10-CM | POA: Diagnosis not present

## 2017-03-18 DIAGNOSIS — G8929 Other chronic pain: Secondary | ICD-10-CM | POA: Diagnosis not present

## 2017-03-18 MED ORDER — TEMAZEPAM 30 MG PO CAPS: 30.0000 mg | ORAL_CAPSULE | Freq: Every evening | ORAL | 1 refills | Status: DC | PRN

## 2017-03-18 MED ORDER — ENALAPRIL MALEATE 10 MG PO TABS: 10.0000 mg | ORAL_TABLET | Freq: Every day | ORAL | 1 refills | Status: DC

## 2017-03-18 MED ORDER — CYCLOBENZAPRINE HCL 10 MG PO TABS: 10.0000 mg | ORAL_TABLET | Freq: Three times a day (TID) | ORAL | 3 refills | Status: DC | PRN

## 2017-03-18 MED ORDER — TRAMADOL HCL 50 MG PO TABS: 50.0000 mg | ORAL_TABLET | Freq: Two times a day (BID) | ORAL | 5 refills | Status: DC | PRN

## 2017-03-18 MED ORDER — SERTRALINE HCL 50 MG PO TABS: 100.0000 mg | ORAL_TABLET | Freq: Every day | ORAL | 1 refills | Status: DC

## 2017-03-18 MED ORDER — LEVOTHYROXINE SODIUM 112 MCG PO TABS: 112.0000 ug | ORAL_TABLET | Freq: Every day | ORAL | 1 refills | Status: DC

## 2017-03-18 NOTE — Addendum Note (Signed)
Addended by: Jannifer RodneyHAWKS, Sybol Morre A on: 03/18/2017 10:11 AM   Modules accepted: Orders

## 2017-03-18 NOTE — Patient Instructions (Signed)

## 2017-03-18 NOTE — Progress Notes (Signed)
Subjective:    Patient ID: Beverly Atkinson, female    DOB: May 01, 1980, 37 y.o.   MRN: [id number]  Pt presents to the office today to establish care. Pt has recently moved from [facility] to our area.  Thyroid Problem  Presents for follow-up visit. Symptoms include dry skin and fatigue. Patient reports no constipation or diarrhea. The symptoms have been stable. There is no history of heart failure.  Insomnia  Primary symptoms: difficulty falling asleep, frequent awakening, malaise/fatigue.  The current episode started more than one year. The onset quality is gradual. The problem occurs nightly. The problem has been waxing and waning since onset. The treatment provided mild relief. PMH includes: depression.  Hypertension  This is a chronic problem. The current episode started more than 1 year ago. The problem has been resolved since onset. The problem is controlled. Associated symptoms include malaise/fatigue. Pertinent negatives include no headaches, peripheral edema or shortness of breath. Risk factors for coronary artery disease include obesity, sedentary lifestyle and dyslipidemia. The current treatment provides mild improvement. There is no history of kidney disease, CAD/MI, CVA or heart failure. Identifiable causes of hypertension include a thyroid problem.  Depression         This is a chronic problem.  The current episode started more than 1 year ago.   The onset quality is gradual.   The problem occurs intermittently.  Associated symptoms include fatigue, insomnia, irritable and sad.  Associated symptoms include no headaches.  Past treatments include SSRIs - Selective serotonin reuptake inhibitors.  Compliance with treatment is good.  Past medical history includes thyroid problem.   Back Pain  This is a chronic problem. The current episode started more than 1 year ago. The problem occurs intermittently. The problem has been waxing and waning since onset. The pain is present in the lumbar spine. The  quality of the pain is described as aching. The pain does not radiate. The pain is at a severity of 5/10. The pain is moderate. Pertinent negatives include no headaches or leg pain.  RLS PT taking Restoril daily that helps.     Review of Systems  Constitutional: Positive for fatigue and malaise/fatigue.  Respiratory: Negative for shortness of breath.   Gastrointestinal: Negative for constipation and diarrhea.  Musculoskeletal: Positive for back pain.  Neurological: Negative for headaches.  Psychiatric/Behavioral: Positive for depression. The patient has insomnia.   All other systems reviewed and are negative.   Social History   Socioeconomic History  . Marital status: Single    Spouse name: None  . Number of children: None  . Years of education: None  . Highest education level: None  Social Needs  . Financial resource strain: None  . Food insecurity - worry: None  . Food insecurity - inability: None  . Transportation needs - medical: None  . Transportation needs - non-medical: None  Occupational History  . None  Tobacco Use  . Smoking status: Never Smoker  . Smokeless tobacco: Never Used  Substance and Sexual Activity  . Alcohol use: Yes    Alcohol/week: 0.6 oz    Types: 1 Shots of liquor per week  . Drug use: No  . Sexual activity: No  Other Topics Concern  . None  Social History Narrative  . None    Family History  Problem Relation Age of Onset  . Drug abuse Mother   . Mental illness Mother   . Cancer Father        Objective:   Physical Exam  Constitutional: She is oriented to person, place, and time. She appears well-developed and well-nourished. She is irritable. No distress.  HENT:  Head: Normocephalic and atraumatic.  Right Ear: External ear normal.  Left Ear: External ear normal.  Nose: Nose normal.  Mouth/Throat: Oropharynx is clear and moist.  Eyes: Pupils are equal, round, and reactive to light.  Neck: Normal range of motion. Neck supple. No  thyromegaly present.  Cardiovascular: Normal rate, regular rhythm, normal heart sounds and intact distal pulses.  No murmur heard. Pulmonary/Chest: Effort normal and breath sounds normal. No respiratory distress. She has no wheezes.  Abdominal: Soft. Bowel sounds are normal. She exhibits no distension. There is no tenderness.  Musculoskeletal: Normal range of motion. She exhibits no edema or tenderness.  Neurological: She is alert and oriented to person, place, and time.  Skin: Skin is warm and dry.  Psychiatric: She has a normal mood and affect. Her behavior is normal. Judgment and thought content normal.  Vitals reviewed.    BP 137/88   Pulse 76   Temp 98 F (36.7 C) (Oral)   Ht 4' 10.5" (1.486 m)   Wt 166 lb 12.8 oz (75.7 kg)   BMI 34.27 kg/m      Assessment & Plan:  1. Primary hypothyroidism - levothyroxine (SYNTHROID) 112 MCG tablet; Take 1 tablet (112 mcg total) by mouth daily before breakfast.  Dispense: 90 tablet; Refill: 1 - CMP14+EGFR - Thyroid Panel With TSH - Thyroid antibodies - C-reactive protein  2. Insomnia, unspecified type Sleep ritual  - CMP14+EGFR - temazepam (RESTORIL) 30 MG capsule; Take 1 capsule (30 mg total) by mouth at bedtime as needed for sleep.  Dispense: 90 capsule; Refill: 1  3. Restless leg syndrome - CMP14+EGFR - temazepam (RESTORIL) 30 MG capsule; Take 1 capsule (30 mg total) by mouth at bedtime as needed for sleep.  Dispense: 90 capsule; Refill: 1  4. Left bundle branch block - CMP14+EGFR  5. Chronic bilateral low back pain without sciatica - CMP14+EGFR - cyclobenzaprine (FLEXERIL) 10 MG tablet; Take 1 tablet (10 mg total) by mouth 3 (three) times daily as needed for muscle spasms.  Dispense: 60 tablet; Refill: 3 - traMADol (ULTRAM) 50 MG tablet; Take 1 tablet (50 mg total) by mouth every 12 (twelve) hours as needed.  Dispense: 30 tablet; Refill: 5  6. Endometriosis - CMP14+EGFR - Ambulatory referral to Obstetrics / Gynecology  7.  Hashimoto's thyroiditis - levothyroxine (SYNTHROID) 112 MCG tablet; Take 1 tablet (112 mcg total) by mouth daily before breakfast.  Dispense: 90 tablet; Refill: 1 - CMP14+EGFR - Thyroid Panel With TSH - Thyroid antibodies - C-reactive protein  8. Major depressive disorder with single episode, in full remission ([place]) Will increase Zoloft to 100 mg daily Stress management disucssed - sertraline (ZOLOFT) 50 MG tablet; Take 2 tablets (100 mg total) by mouth daily.  Dispense: 180 tablet; Refill: 1 - CMP14+EGFR  9. Hyperlipidemia, unspecified hyperlipidemia type - Lipid panel  10. Obesity (BMI 30-39.9)   Continue all meds Labs pending Health Maintenance reviewed Diet and exercise encouraged RTO 3 months   Evelina Dun, FNP

## 2017-03-19 ENCOUNTER — Other Ambulatory Visit: Payer: Self-pay | Admitting: Family

## 2017-03-19 LAB — CMP14+EGFR
A/G RATIO: 1.4 (ref 1.2–2.2)
ALT: 17 IU/L (ref 0–32)
AST: 17 IU/L (ref 0–40)
Albumin: 4 g/dL (ref 3.5–5.5)
Alkaline Phosphatase: 107 IU/L (ref 39–117)
BILIRUBIN TOTAL: 0.4 mg/dL (ref 0.0–1.2)
BUN/Creatinine Ratio: 9 (ref 9–23)
BUN: 7 mg/dL (ref 6–20)
CALCIUM: 9.4 mg/dL (ref 8.7–10.2)
CHLORIDE: 104 mmol/L (ref 96–106)
CO2: 23 mmol/L (ref 20–29)
Creatinine, Ser: 0.74 mg/dL (ref 0.57–1.00)
GFR calc Af Amer: 119 mL/min/{1.73_m2} (ref >59)
GFR, EST NON AFRICAN AMERICAN: 103 mL/min/{1.73_m2} (ref >59)
GLOBULIN, TOTAL: 2.8 g/dL (ref 1.5–4.5)
Glucose: 98 mg/dL (ref 65–99)
POTASSIUM: 4.3 mmol/L (ref 3.5–5.2)
SODIUM: 142 mmol/L (ref 134–144)
Total Protein: 6.8 g/dL (ref 6.0–8.5)

## 2017-03-19 LAB — LIPID PANEL
Chol/HDL Ratio: 4.4 ratio (ref 0.0–4.4)
Cholesterol, Total: 188 mg/dL (ref 100–199)
HDL: 43 mg/dL (ref >39)
LDL Calculated: 122 mg/dL — ABNORMAL HIGH (ref 0–99)
TRIGLYCERIDES: 114 mg/dL (ref 0–149)
VLDL Cholesterol Cal: 23 mg/dL (ref 5–40)

## 2017-03-19 LAB — THYROID PANEL WITH TSH
FREE THYROXINE INDEX: 1.2 (ref 1.2–4.9)
T3 UPTAKE RATIO: 22 % — AB (ref 24–39)
T4, Total: 5.5 ug/dL (ref 4.5–12.0)
TSH: 0.021 u[IU]/mL — AB (ref 0.450–4.500)

## 2017-03-19 LAB — C-REACTIVE PROTEIN: CRP: 6.9 mg/L — AB (ref 0.0–4.9)

## 2017-03-19 LAB — THYROID ANTIBODIES
THYROID PEROXIDASE ANTIBODY: 120 [IU]/mL — AB (ref 0–34)
Thyroglobulin Antibody: 1.8 IU/mL — ABNORMAL HIGH (ref 0.0–0.9)

## 2017-03-19 MED ORDER — LEVOTHYROXINE SODIUM 100 MCG PO TABS: 100.0000 ug | ORAL_TABLET | Freq: Every day | ORAL | 1 refills | Status: DC

## 2017-03-29 ENCOUNTER — Encounter: Attending: "Endocrinology | Primary: Family Medicine

## 2017-04-16 ENCOUNTER — Encounter: Payer: Self-pay | Admitting: Obstetrics & Gynecology

## 2017-06-13 ENCOUNTER — Ambulatory Visit (INDEPENDENT_AMBULATORY_CARE_PROVIDER_SITE_OTHER): Payer: 59 | Admitting: Family

## 2017-06-13 ENCOUNTER — Encounter: Payer: Self-pay | Admitting: Family

## 2017-06-13 VITALS — BP 110/71 | HR 104 | Temp 98.7°F | Ht 58.5 in | Wt 177.4 lb

## 2017-06-13 DIAGNOSIS — G8929 Other chronic pain: Secondary | ICD-10-CM

## 2017-06-13 DIAGNOSIS — I10 Essential (primary) hypertension: Secondary | ICD-10-CM

## 2017-06-13 DIAGNOSIS — E039 Hypothyroidism, unspecified: Secondary | ICD-10-CM

## 2017-06-13 DIAGNOSIS — R5383 Other fatigue: Secondary | ICD-10-CM | POA: Diagnosis not present

## 2017-06-13 DIAGNOSIS — M545 Low back pain, unspecified: Secondary | ICD-10-CM

## 2017-06-13 DIAGNOSIS — G4700 Insomnia, unspecified: Secondary | ICD-10-CM | POA: Diagnosis not present

## 2017-06-13 DIAGNOSIS — R000 Tachycardia, unspecified: Secondary | ICD-10-CM | POA: Diagnosis not present

## 2017-06-13 DIAGNOSIS — F325 Major depressive disorder, single episode, in full remission: Secondary | ICD-10-CM

## 2017-06-13 DIAGNOSIS — R69 Illness, unspecified: Secondary | ICD-10-CM | POA: Diagnosis not present

## 2017-06-13 MED ORDER — TRAMADOL HCL 50 MG PO TABS: 50.0000 mg | ORAL_TABLET | Freq: Two times a day (BID) | ORAL | 5 refills | Status: DC | PRN

## 2017-06-13 NOTE — Patient Instructions (Signed)

## 2017-06-13 NOTE — Progress Notes (Signed)
Subjective:    Patient ID: Beverly Atkinson, female    DOB: 1980/08/21, 37 y.o.   MRN: [id number]  Chief Complaint  Patient presents with  . Hypothyroidism  . Anxiety  . Depression    Anxiety  Presents for follow-up visit. Symptoms include excessive worry, insomnia, irritability, nervous/anxious behavior and restlessness. Patient reports no shortness of breath. Symptoms occur occasionally. The severity of symptoms is moderate. The quality of sleep is good.    Depression         This is a chronic problem.  The current episode started more than 1 year ago.   The onset quality is gradual.   The problem occurs intermittently.  The problem has been waxing and waning since onset.  Associated symptoms include fatigue, insomnia, restlessness and sad.  Past medical history includes thyroid problem and anxiety.   Thyroid Problem  Presents for follow-up visit. Symptoms include anxiety, constipation, dry skin, fatigue and weight gain. Patient reports no diaphoresis. The symptoms have been worsening.  Back Pain  This is a chronic problem. The current episode started more than 1 year ago. The problem occurs intermittently. The problem has been waxing and waning since onset. The pain is present in the lumbar spine. The quality of the pain is described as aching. The pain is at a severity of 4/10. The pain is moderate.  Hypertension  This is a chronic problem. The current episode started more than 1 year ago. The problem has been resolved since onset. The problem is controlled. Associated symptoms include anxiety and malaise/fatigue. Pertinent negatives include no peripheral edema or shortness of breath. The current treatment provides moderate improvement. Identifiable causes of hypertension include a thyroid problem.  Insomnia  Primary symptoms: difficulty falling asleep, malaise/fatigue.  The current episode started more than one month. The onset quality is gradual. The problem occurs intermittently. PMH  includes: depression.      Review of Systems  Constitutional: Positive for fatigue, irritability, malaise/fatigue and weight gain. Negative for diaphoresis.  Respiratory: Negative for shortness of breath.   Gastrointestinal: Positive for constipation.  Musculoskeletal: Positive for back pain.  Psychiatric/Behavioral: Positive for depression. The patient is nervous/anxious and has insomnia.   All other systems reviewed and are negative.      Objective:   Physical Exam  Constitutional: She is oriented to person, place, and time. She appears well-developed and well-nourished. No distress.  HENT:  Head: Normocephalic and atraumatic.  Right Ear: External ear normal.  Left Ear: External ear normal.  Mouth/Throat: Oropharynx is clear and moist.  Eyes: Pupils are equal, round, and reactive to light.  Neck: Normal range of motion. Neck supple. No thyromegaly present.  Cardiovascular: Regular rhythm, normal heart sounds and intact distal pulses. Tachycardia present.  No murmur heard. Pulmonary/Chest: Effort normal and breath sounds normal. No respiratory distress. She has no wheezes.  Abdominal: Soft. Bowel sounds are normal. She exhibits no distension. There is no tenderness.  Musculoskeletal: Normal range of motion. She exhibits no edema or tenderness.  Neurological: She is alert and oriented to person, place, and time. She has normal reflexes. No cranial nerve deficit.  Skin: Skin is warm and dry.  Psychiatric: She has a normal mood and affect. Her behavior is normal. Judgment and thought content normal.  Vitals reviewed.    BP 110/71   Pulse (!) 104   Temp 98.7 F (37.1 C) (Oral)   Ht 4' 10.5" (1.486 m)   Wt 177 lb 6.4 oz (80.5 kg)   LMP  05/27/2017 (Approximate)   BMI 36.45 kg/m       Assessment & Plan:  Beverly Atkinson comes in today with chief complaint of Hypothyroidism; Anxiety; Depression; and Medical Management of Chronic Issues   Diagnosis and orders  addressed:  1. Primary hypothyroidism - CMP14+EGFR - TSH - Anemia Profile B  2. Insomnia, unspecified type - CMP14+EGFR  3. Morbid obesity ([place]) - CMP14+EGFR  4. Chronic bilateral low back pain without sciatica - CMP14+EGFR - traMADol (ULTRAM) 50 MG tablet; Take 1 tablet (50 mg total) by mouth every 12 (twelve) hours as needed.  Dispense: 30 tablet; Refill: 5  5. Essential hypertension - CMP14+EGFR  6. Major depressive disorder with single episode, in full remission ([place]) - CMP14+EGFR  7. Tachycardia - EKG 12-Lead - CMP14+EGFR  8. Fatigue, unspecified type - CMP14+EGFR - TSH   Labs pending Health Maintenance reviewed Diet and exercise encouraged  Follow up plan: 3 months    Evelina Dun, FNP

## 2017-06-14 LAB — ANEMIA PROFILE B
BASOS ABS: 0 10*3/uL (ref 0.0–0.2)
Basos: 0 %
EOS (ABSOLUTE): 0.1 10*3/uL (ref 0.0–0.4)
EOS: 1 %
FOLATE: 8.2 ng/mL (ref >3.0)
Ferritin: 36 ng/mL (ref 15–150)
HEMOGLOBIN: 14.2 g/dL (ref 11.1–15.9)
Hematocrit: 40.8 % (ref 34.0–46.6)
IMMATURE GRANULOCYTES: 0 %
IRON SATURATION: 37 % (ref 15–55)
IRON: 120 ug/dL (ref 27–159)
Immature Grans (Abs): 0 10*3/uL (ref 0.0–0.1)
LYMPHS: 18 %
Lymphocytes Absolute: 1.6 10*3/uL (ref 0.7–3.1)
MCH: 29.3 pg (ref 26.6–33.0)
MCHC: 34.8 g/dL (ref 31.5–35.7)
MCV: 84 fL (ref 79–97)
Monocytes Absolute: 0.4 10*3/uL (ref 0.1–0.9)
Monocytes: 4 %
NEUTROS PCT: 77 %
Neutrophils Absolute: 7 10*3/uL (ref 1.4–7.0)
PLATELETS: 366 10*3/uL (ref 150–450)
RBC: 4.85 x10E6/uL (ref 3.77–5.28)
RDW: 13.6 % (ref 12.3–15.4)
Retic Ct Pct: 1.8 % (ref 0.6–2.6)
TIBC: 326 ug/dL (ref 250–450)
UIBC: 206 ug/dL (ref 131–425)
VITAMIN B 12: 485 pg/mL (ref 232–1245)
WBC: 9.1 10*3/uL (ref 3.4–10.8)

## 2017-06-14 LAB — CMP14+EGFR
A/G RATIO: 1.5 (ref 1.2–2.2)
ALK PHOS: 95 IU/L (ref 39–117)
ALT: 14 IU/L (ref 0–32)
AST: 15 IU/L (ref 0–40)
Albumin: 4 g/dL (ref 3.5–5.5)
BUN/Creatinine Ratio: 12 (ref 9–23)
BUN: 10 mg/dL (ref 6–20)
Bilirubin Total: 0.3 mg/dL (ref 0.0–1.2)
CALCIUM: 9.4 mg/dL (ref 8.7–10.2)
CHLORIDE: 102 mmol/L (ref 96–106)
CO2: 25 mmol/L (ref 20–29)
Creatinine, Ser: 0.83 mg/dL (ref 0.57–1.00)
GFR calc Af Amer: 103 mL/min/{1.73_m2} (ref >59)
GFR, EST NON AFRICAN AMERICAN: 90 mL/min/{1.73_m2} (ref >59)
Globulin, Total: 2.7 g/dL (ref 1.5–4.5)
Glucose: 111 mg/dL — ABNORMAL HIGH (ref 65–99)
POTASSIUM: 4.4 mmol/L (ref 3.5–5.2)
Sodium: 140 mmol/L (ref 134–144)
Total Protein: 6.7 g/dL (ref 6.0–8.5)

## 2017-06-14 LAB — TSH: TSH: 0.039 u[IU]/mL — AB (ref 0.450–4.500)

## 2017-06-18 ENCOUNTER — Other Ambulatory Visit: Payer: Self-pay | Admitting: Family

## 2017-06-18 DIAGNOSIS — Z124 Encounter for screening for malignant neoplasm of cervix: Secondary | ICD-10-CM | POA: Diagnosis not present

## 2017-06-18 DIAGNOSIS — E038 Other specified hypothyroidism: Secondary | ICD-10-CM | POA: Insufficient documentation

## 2017-06-18 DIAGNOSIS — Z8742 Personal history of other diseases of the female genital tract: Secondary | ICD-10-CM | POA: Insufficient documentation

## 2017-06-18 DIAGNOSIS — E282 Polycystic ovarian syndrome: Secondary | ICD-10-CM | POA: Insufficient documentation

## 2017-06-18 DIAGNOSIS — R102 Pelvic and perineal pain: Secondary | ICD-10-CM | POA: Diagnosis not present

## 2017-06-18 MED ORDER — LEVOTHYROXINE SODIUM 88 MCG PO TABS: 88.0000 ug | ORAL_TABLET | Freq: Every day | ORAL | 1 refills | Status: DC

## 2017-09-25 ENCOUNTER — Other Ambulatory Visit: Payer: Self-pay | Admitting: Family

## 2017-09-25 DIAGNOSIS — G2581 Restless legs syndrome: Secondary | ICD-10-CM

## 2017-09-25 DIAGNOSIS — G4700 Insomnia, unspecified: Secondary | ICD-10-CM

## 2017-09-26 NOTE — Telephone Encounter (Signed)
appt made for 09/27/17, med will be refilled then

## 2017-09-27 ENCOUNTER — Encounter: Payer: Self-pay | Admitting: Family

## 2017-09-27 ENCOUNTER — Ambulatory Visit: Payer: 59 | Admitting: Family

## 2017-09-27 VITALS — BP 125/87 | HR 86 | Temp 98.4°F | Ht 58.5 in | Wt 189.8 lb

## 2017-09-27 DIAGNOSIS — F1120 Opioid dependence, uncomplicated: Secondary | ICD-10-CM | POA: Insufficient documentation

## 2017-09-27 DIAGNOSIS — E039 Hypothyroidism, unspecified: Secondary | ICD-10-CM

## 2017-09-27 DIAGNOSIS — G2581 Restless legs syndrome: Secondary | ICD-10-CM | POA: Diagnosis not present

## 2017-09-27 DIAGNOSIS — I10 Essential (primary) hypertension: Secondary | ICD-10-CM

## 2017-09-27 DIAGNOSIS — F325 Major depressive disorder, single episode, in full remission: Secondary | ICD-10-CM | POA: Diagnosis not present

## 2017-09-27 DIAGNOSIS — M545 Low back pain, unspecified: Secondary | ICD-10-CM

## 2017-09-27 DIAGNOSIS — G8929 Other chronic pain: Secondary | ICD-10-CM | POA: Diagnosis not present

## 2017-09-27 DIAGNOSIS — F1129 Opioid dependence with unspecified opioid-induced disorder: Secondary | ICD-10-CM

## 2017-09-27 DIAGNOSIS — R69 Illness, unspecified: Secondary | ICD-10-CM | POA: Diagnosis not present

## 2017-09-27 DIAGNOSIS — Z0289 Encounter for other administrative examinations: Secondary | ICD-10-CM

## 2017-09-27 DIAGNOSIS — G4700 Insomnia, unspecified: Secondary | ICD-10-CM

## 2017-09-27 MED ORDER — CYCLOBENZAPRINE HCL 10 MG PO TABS: 10.0000 mg | ORAL_TABLET | Freq: Three times a day (TID) | ORAL | 3 refills | Status: DC | PRN

## 2017-09-27 MED ORDER — LEVOTHYROXINE SODIUM 88 MCG PO TABS: 88.0000 ug | ORAL_TABLET | Freq: Every day | ORAL | 3 refills | Status: DC

## 2017-09-27 MED ORDER — TRAMADOL HCL 50 MG PO TABS: 50.0000 mg | ORAL_TABLET | Freq: Two times a day (BID) | ORAL | 5 refills | Status: DC | PRN

## 2017-09-27 MED ORDER — SERTRALINE HCL 50 MG PO TABS: 100.0000 mg | ORAL_TABLET | Freq: Every day | ORAL | 2 refills | Status: DC

## 2017-09-27 MED ORDER — ENALAPRIL MALEATE 10 MG PO TABS: 10.0000 mg | ORAL_TABLET | Freq: Every day | ORAL | 2 refills | Status: DC

## 2017-09-27 MED ORDER — TEMAZEPAM 30 MG PO CAPS: 30.0000 mg | ORAL_CAPSULE | Freq: Every evening | ORAL | 1 refills | Status: DC | PRN

## 2017-09-27 NOTE — Patient Instructions (Signed)

## 2017-09-27 NOTE — Progress Notes (Signed)
Subjective:    Patient ID: Beverly Atkinson, female    DOB: [birth year], 37 y.o.   MRN: [id number]  Chief Complaint  Patient presents with  . Medical Management of Chronic Issues    refills   Pt presents to the office today for chronic follow up and pain medication refill.  Back Pain  This is a chronic problem. The current episode started more than 1 year ago. The problem occurs intermittently. The problem has been waxing and waning since onset. The pain is present in the lumbar spine. The quality of the pain is described as aching. The pain does not radiate. The pain is at a severity of 3/10. The pain is mild. The symptoms are aggravated by bending and standing. Pertinent negatives include no bladder incontinence, bowel incontinence or leg pain. Risk factors include obesity. She has tried bed rest, muscle relaxant and analgesics for the symptoms. The treatment provided moderate relief.  Anxiety  Presents for follow-up visit. Symptoms include depressed mood, excessive worry, insomnia, irritability and nervous/anxious behavior. Symptoms occur occasionally. The severity of symptoms is moderate. The quality of sleep is good.    Depression         This is a chronic problem.  The current episode started more than 1 year ago.   The onset quality is gradual.   The problem occurs intermittently.  The problem has been waxing and waning since onset.  Associated symptoms include fatigue, insomnia, irritable and sad.  Associated symptoms include no helplessness, no hopelessness, no decreased interest and no appetite change.  Past treatments include SSRIs - Selective serotonin reuptake inhibitors.  Compliance with treatment is good.  Past medical history includes thyroid problem and anxiety.   Insomnia  Primary symptoms: difficulty falling asleep, frequent awakening.  The current episode started more than one year. The onset quality is gradual. The problem occurs intermittently. The problem has been gradually  improving since onset. The treatment provided moderate relief. PMH includes: depression.  Thyroid Problem  Presents for follow-up visit. Symptoms include anxiety, depressed mood and fatigue. Patient reports no constipation or diarrhea. The symptoms have been stable.  [facility] PT states she is doing well with this. States her Restoril helps with this.     Review of Systems  Constitutional: Positive for fatigue and irritability. Negative for appetite change.  Gastrointestinal: Negative for bowel incontinence, constipation and diarrhea.  Genitourinary: Negative for bladder incontinence.  Musculoskeletal: Positive for back pain.  Psychiatric/Behavioral: Positive for depression. The patient is nervous/anxious and has insomnia.   All other systems reviewed and are negative.      Objective:   Physical Exam  Constitutional: She is oriented to person, place, and time. She appears well-developed and well-nourished. She is irritable. No distress.  HENT:  Head: Normocephalic and atraumatic.  Right Ear: External ear normal.  Left Ear: External ear normal.  Mouth/Throat: Oropharynx is clear and moist. Abnormal dentition.  Eyes: Pupils are equal, round, and reactive to light.  Neck: Normal range of motion. Neck supple. No thyromegaly present.  Cardiovascular: Normal rate, regular rhythm, normal heart sounds and intact distal pulses.  No murmur heard. Pulmonary/Chest: Effort normal and breath sounds normal. No respiratory distress. She has no wheezes.  Abdominal: Soft. Bowel sounds are normal. She exhibits no distension. There is no tenderness.  Musculoskeletal: Normal range of motion. She exhibits no edema or tenderness.  Full ROM of back, mild tenderness with flexion  Neurological: She is alert and oriented to person, place, and time. She has  normal reflexes. No cranial nerve deficit.  Skin: Skin is warm and dry.  Psychiatric: She has a normal mood and affect. Her behavior is normal. Judgment and  thought content normal.  Vitals reviewed.     BP 125/87   Pulse 86   Temp 98.4 F (36.9 C) (Oral)   Ht 4' 10.5" (1.486 m)   Wt 189 lb 12.8 oz (86.1 kg)   BMI 38.99 kg/m      Assessment & Plan:  Beverly Atkinson Pain comes in today with chief complaint of Medical Management of Chronic Issues (refills)   Diagnosis and orders addressed:  1. Chronic bilateral low back pain without sciatica - cyclobenzaprine (FLEXERIL) 10 MG tablet; Take 1 tablet (10 mg total) by mouth 3 (three) times daily as needed for muscle spasms.  Dispense: 60 tablet; Refill: 3 - CMP14+EGFR - traMADol (ULTRAM) 50 MG tablet; Take 1 tablet (50 mg total) by mouth every 12 (twelve) hours as needed.  Dispense: 30 tablet; Refill: 5 - ToxASSURE Select 13 (MW), Urine  2. Major depressive disorder with single episode, in full remission ([place]) - sertraline (ZOLOFT) 50 MG tablet; Take 2 tablets (100 mg total) by mouth daily.  Dispense: 180 tablet; Refill: 2 - CMP14+EGFR  3. Insomnia, unspecified type - temazepam (RESTORIL) 30 MG capsule; Take 1 capsule (30 mg total) by mouth at bedtime as needed for sleep.  Dispense: 90 capsule; Refill: 1 - CMP14+EGFR  4. Restless leg syndrome - temazepam (RESTORIL) 30 MG capsule; Take 1 capsule (30 mg total) by mouth at bedtime as needed for sleep.  Dispense: 90 capsule; Refill: 1 - CMP14+EGFR  5. Essential hypertension - enalapril (VASOTEC) 10 MG tablet; Take 1 tablet (10 mg total) by mouth daily.  Dispense: 90 tablet; Refill: 2 - CMP14+EGFR  6. Morbid obesity ([place])  - CMP14+EGFR  7. Primary hypothyroidism - levothyroxine (SYNTHROID) 88 MCG tablet; Take 1 tablet (88 mcg total) by mouth daily.  Dispense: 90 tablet; Refill: 3 - CMP14+EGFR - TSH  8. Pain management contract signed - traMADol (ULTRAM) 50 MG tablet; Take 1 tablet (50 mg total) by mouth every 12 (twelve) hours as needed.  Dispense: 30 tablet; Refill: 5 - ToxASSURE Select 13 (MW), Urine  9. Opioid dependence with  opioid-induced disorder ([facility]) - traMADol (ULTRAM) 50 MG tablet; Take 1 tablet (50 mg total) by mouth every 12 (twelve) hours as needed.  Dispense: 30 tablet; Refill: 5 - ToxASSURE Select 13 (MW), Urine   Labs pending Health Maintenance reviewed Diet and exercise encouraged  Follow up plan: 3 months    Evelina Dun, FNP

## 2017-09-28 LAB — CMP14+EGFR
ALK PHOS: 104 IU/L (ref 39–117)
ALT: 14 IU/L (ref 0–32)
AST: 17 IU/L (ref 0–40)
Albumin/Globulin Ratio: 1.4 (ref 1.2–2.2)
Albumin: 4 g/dL (ref 3.5–5.5)
BILIRUBIN TOTAL: 0.3 mg/dL (ref 0.0–1.2)
BUN / CREAT RATIO: 11 (ref 9–23)
BUN: 8 mg/dL (ref 6–20)
CO2: 24 mmol/L (ref 20–29)
CREATININE: 0.75 mg/dL (ref 0.57–1.00)
Calcium: 9.6 mg/dL (ref 8.7–10.2)
Chloride: 104 mmol/L (ref 96–106)
GFR calc Af Amer: 117 mL/min/{1.73_m2} (ref >59)
GFR calc non Af Amer: 101 mL/min/{1.73_m2} (ref >59)
GLOBULIN, TOTAL: 2.8 g/dL (ref 1.5–4.5)
GLUCOSE: 99 mg/dL (ref 65–99)
Potassium: 4.7 mmol/L (ref 3.5–5.2)
SODIUM: 142 mmol/L (ref 134–144)
Total Protein: 6.8 g/dL (ref 6.0–8.5)

## 2017-09-28 LAB — TSH: TSH: 0.099 u[IU]/mL — ABNORMAL LOW (ref 0.450–4.500)

## 2017-09-30 ENCOUNTER — Other Ambulatory Visit: Payer: Self-pay | Admitting: Family

## 2017-09-30 MED ORDER — LEVOTHYROXINE SODIUM 75 MCG PO TABS: 75.0000 ug | ORAL_TABLET | Freq: Every day | ORAL | 1 refills | Status: DC

## 2017-10-02 LAB — TOXASSURE SELECT 13 (MW), URINE

## 2017-12-30 ENCOUNTER — Ambulatory Visit: Payer: 59 | Admitting: Family

## 2017-12-30 ENCOUNTER — Other Ambulatory Visit: Payer: Self-pay | Admitting: Family

## 2017-12-30 ENCOUNTER — Encounter: Payer: Self-pay | Admitting: Family

## 2017-12-30 VITALS — BP 135/78 | HR 109 | Temp 97.5°F | Ht 58.5 in | Wt 196.4 lb

## 2017-12-30 DIAGNOSIS — R300 Dysuria: Secondary | ICD-10-CM

## 2017-12-30 DIAGNOSIS — G2581 Restless legs syndrome: Secondary | ICD-10-CM

## 2017-12-30 DIAGNOSIS — E039 Hypothyroidism, unspecified: Secondary | ICD-10-CM | POA: Diagnosis not present

## 2017-12-30 DIAGNOSIS — F325 Major depressive disorder, single episode, in full remission: Secondary | ICD-10-CM | POA: Diagnosis not present

## 2017-12-30 DIAGNOSIS — Z0289 Encounter for other administrative examinations: Secondary | ICD-10-CM

## 2017-12-30 DIAGNOSIS — I10 Essential (primary) hypertension: Secondary | ICD-10-CM

## 2017-12-30 DIAGNOSIS — G8929 Other chronic pain: Secondary | ICD-10-CM | POA: Diagnosis not present

## 2017-12-30 DIAGNOSIS — G4700 Insomnia, unspecified: Secondary | ICD-10-CM

## 2017-12-30 DIAGNOSIS — R69 Illness, unspecified: Secondary | ICD-10-CM | POA: Diagnosis not present

## 2017-12-30 DIAGNOSIS — M545 Low back pain, unspecified: Secondary | ICD-10-CM

## 2017-12-30 DIAGNOSIS — F1129 Opioid dependence with unspecified opioid-induced disorder: Secondary | ICD-10-CM | POA: Diagnosis not present

## 2017-12-30 LAB — MICROSCOPIC EXAMINATION
Renal Epithel, UA: NONE SEEN /hpf
WBC, UA: >30 /hpf — AB (ref 0–5)

## 2017-12-30 LAB — URINALYSIS, COMPLETE
Bilirubin, UA: NEGATIVE
Glucose, UA: NEGATIVE
Ketones, UA: NEGATIVE
Nitrite, UA: NEGATIVE
PH UA: 6.5 (ref 5.0–7.5)
PROTEIN UA: NEGATIVE
SPEC GRAV UA: 1.015 (ref 1.005–1.030)
Urobilinogen, Ur: 1 mg/dL (ref 0.2–1.0)

## 2017-12-30 MED ORDER — TEMAZEPAM 30 MG PO CAPS: 30.0000 mg | ORAL_CAPSULE | Freq: Every evening | ORAL | 1 refills | Status: DC | PRN

## 2017-12-30 MED ORDER — SERTRALINE HCL 100 MG PO TABS: 100.0000 mg | ORAL_TABLET | Freq: Every day | ORAL | 5 refills | Status: DC

## 2017-12-30 MED ORDER — CEPHALEXIN 500 MG PO CAPS: 500.0000 mg | ORAL_CAPSULE | Freq: Two times a day (BID) | ORAL | 0 refills | Status: DC

## 2017-12-30 MED ORDER — TRAMADOL HCL 50 MG PO TABS: 50.0000 mg | ORAL_TABLET | Freq: Two times a day (BID) | ORAL | 5 refills | Status: DC | PRN

## 2017-12-30 NOTE — Patient Instructions (Signed)

## 2017-12-30 NOTE — Progress Notes (Signed)
Subjective:    Patient ID: Beverly Atkinson, female    DOB: 10/04/1980, 37 y.o.   MRN: [id number]  Chief Complaint  Patient presents with  . Medical Management of Chronic Issues    three month recheck   Pt presents to the office today for chronic follow up and pain medication refill.  Hypertension  This is a chronic problem. The current episode started more than 1 year ago. The problem has been resolved since onset. The problem is controlled. Associated symptoms include headaches and malaise/fatigue. Pertinent negatives include no peripheral edema or shortness of breath. The current treatment provides moderate improvement. There is no history of kidney disease or heart failure. Identifiable causes of hypertension include a thyroid problem.  Back Pain  This is a chronic problem. The current episode started more than 1 year ago. The problem occurs intermittently. The problem has been waxing and waning since onset. The pain is present in the lumbar spine. The pain is at a severity of 4/10. The pain is moderate. The symptoms are aggravated by bending and lying down. Associated symptoms include dysuria and headaches. The treatment provided moderate relief.  Thyroid Problem  Presents for follow-up visit. Symptoms include fatigue and hoarse voice. Patient reports no constipation or diarrhea. The symptoms have been stable. There is no history of heart failure.  Depression         This is a chronic problem.  The current episode started more than 1 year ago.   The onset quality is gradual.   The problem occurs intermittently.  Associated symptoms include fatigue, insomnia, irritable, decreased interest and headaches.  Associated symptoms include no hopelessness.  Past treatments include SSRIs - Selective serotonin reuptake inhibitors.  Past medical history includes thyroid problem.   Insomnia  Primary symptoms: difficulty falling asleep, frequent awakening, malaise/fatigue.  The current episode started more  than one year. The onset quality is gradual. The problem occurs intermittently. The symptoms are aggravated by anxiety. PMH includes: depression.  Dysuria   This is a new problem. The current episode started 1 to 4 weeks ago. The problem occurs intermittently. The problem has been waxing and waning. The quality of the pain is described as burning. The pain is at a severity of 2/10. The pain is mild. Associated symptoms include frequency and urgency. Pertinent negatives include no discharge, flank pain or hematuria. She has tried increased fluids for the symptoms. The treatment provided no relief.      Review of Systems  Constitutional: Positive for fatigue and malaise/fatigue.  HENT: Positive for hoarse voice.   Respiratory: Negative for shortness of breath.   Gastrointestinal: Negative for constipation and diarrhea.  Genitourinary: Positive for dysuria, frequency and urgency. Negative for flank pain and hematuria.  Musculoskeletal: Positive for back pain.  Neurological: Positive for headaches.  Psychiatric/Behavioral: Positive for depression. The patient has insomnia.   All other systems reviewed and are negative.      Objective:   Physical Exam  Constitutional: She is oriented to person, place, and time. She appears well-developed and well-nourished. She is irritable. No distress.  HENT:  Head: Normocephalic and atraumatic.  Right Ear: External ear normal.  Left Ear: External ear normal.  Mouth/Throat: Oropharynx is clear and moist.  Eyes: Pupils are equal, round, and reactive to light.  Neck: Normal range of motion. Neck supple. No thyromegaly present.  Cardiovascular: Normal rate, regular rhythm and intact distal pulses.  Murmur heard. Pulmonary/Chest: Effort normal and breath sounds normal. No respiratory distress. She has  no wheezes.  Abdominal: Soft. Bowel sounds are normal. She exhibits no distension. There is no tenderness.  Musculoskeletal: Normal range of motion. She  exhibits no edema or tenderness.  Neurological: She is alert and oriented to person, place, and time. She has normal reflexes. No cranial nerve deficit.  Skin: Skin is warm and dry.  Psychiatric: She has a normal mood and affect. Her behavior is normal. Judgment and thought content normal.  Vitals reviewed.     BP 135/78   Pulse (!) 109   Temp (!) 97.5 F (36.4 C) (Oral)   Ht 4' 10.5" (1.486 m)   Wt 196 lb 6.4 oz (89.1 kg)   BMI 40.35 kg/m      Assessment & Plan:  Beverly Atkinson comes in today with chief complaint of Medical Management of Chronic Issues (three month recheck)   Diagnosis and orders addressed:  1. Essential hypertension - CMP14+EGFR - CBC with Differential/Platelet  2. Primary hypothyroidism - CMP14+EGFR - CBC with Differential/Platelet - TSH  3. Major depressive disorder with single episode, in full remission (Beverly Atkinson) - CMP14+EGFR - CBC with Differential/Platelet  4. Opioid dependence with opioid-induced disorder ([facility]) - CMP14+EGFR - CBC with Differential/Platelet - traMADol (ULTRAM) 50 MG tablet; Take 1 tablet (50 mg total) by mouth every 12 (twelve) hours as needed.  Dispense: 30 tablet; Refill: 5  5. Pain management contract signed - CMP14+EGFR - CBC with Differential/Platelet - traMADol (ULTRAM) 50 MG tablet; Take 1 tablet (50 mg total) by mouth every 12 (twelve) hours as needed.  Dispense: 30 tablet; Refill: 5  6. Morbid obesity (Beverly Atkinson) - CMP14+EGFR - CBC with Differential/Platelet  7. Chronic bilateral low back pain without sciatica - CMP14+EGFR - CBC with Differential/Platelet - traMADol (ULTRAM) 50 MG tablet; Take 1 tablet (50 mg total) by mouth every 12 (twelve) hours as needed.  Dispense: 30 tablet; Refill: 5  8. Dysuria - CMP14+EGFR - CBC with Differential/Platelet - Urinalysis, Complete  9. Insomnia, unspecified type - temazepam (RESTORIL) 30 MG capsule; Take 1 capsule (30 mg total) by mouth at bedtime as needed for sleep.   Dispense: 90 capsule; Refill: 1  10. Restless leg syndrome - temazepam (RESTORIL) 30 MG capsule; Take 1 capsule (30 mg total) by mouth at bedtime as needed for sleep.  Dispense: 90 capsule; Refill: 1   Labs pending Beverly Maintenance reviewed Diet and exercise encouraged  Follow up plan: 3 months    Evelina Dun, FNP

## 2017-12-31 ENCOUNTER — Other Ambulatory Visit: Payer: Self-pay | Admitting: Family

## 2017-12-31 LAB — CMP14+EGFR
A/G RATIO: 1.7 (ref 1.2–2.2)
ALK PHOS: 96 IU/L (ref 39–117)
ALT: 13 IU/L (ref 0–32)
AST: 15 IU/L (ref 0–40)
Albumin: 3.9 g/dL (ref 3.5–5.5)
BUN/Creatinine Ratio: 14 (ref 9–23)
BUN: 10 mg/dL (ref 6–20)
Bilirubin Total: 0.2 mg/dL (ref 0.0–1.2)
CHLORIDE: 103 mmol/L (ref 96–106)
CO2: 21 mmol/L (ref 20–29)
Calcium: 8.8 mg/dL (ref 8.7–10.2)
Creatinine, Ser: 0.7 mg/dL (ref 0.57–1.00)
GFR calc Af Amer: 126 mL/min/{1.73_m2} (ref >59)
GFR calc non Af Amer: 109 mL/min/{1.73_m2} (ref >59)
GLOBULIN, TOTAL: 2.3 g/dL (ref 1.5–4.5)
GLUCOSE: 197 mg/dL — AB (ref 65–99)
POTASSIUM: 3.8 mmol/L (ref 3.5–5.2)
SODIUM: 142 mmol/L (ref 134–144)
Total Protein: 6.2 g/dL (ref 6.0–8.5)

## 2017-12-31 LAB — CBC WITH DIFFERENTIAL/PLATELET
BASOS ABS: 0 10*3/uL (ref 0.0–0.2)
Basos: 0 %
EOS (ABSOLUTE): 0.1 10*3/uL (ref 0.0–0.4)
Eos: 1 %
Hematocrit: 39.4 % (ref 34.0–46.6)
Hemoglobin: 13.4 g/dL (ref 11.1–15.9)
IMMATURE GRANULOCYTES: 1 %
Immature Grans (Abs): 0 10*3/uL (ref 0.0–0.1)
Lymphocytes Absolute: 1.4 10*3/uL (ref 0.7–3.1)
Lymphs: 19 %
MCH: 29.3 pg (ref 26.6–33.0)
MCHC: 34 g/dL (ref 31.5–35.7)
MCV: 86 fL (ref 79–97)
MONOS ABS: 0.3 10*3/uL (ref 0.1–0.9)
Monocytes: 3 %
Neutrophils Absolute: 5.9 10*3/uL (ref 1.4–7.0)
Neutrophils: 76 %
PLATELETS: 380 10*3/uL (ref 150–450)
RBC: 4.58 x10E6/uL (ref 3.77–5.28)
RDW: 12.7 % (ref 12.3–15.4)
WBC: 7.7 10*3/uL (ref 3.4–10.8)

## 2017-12-31 LAB — TSH: TSH: 0.218 u[IU]/mL — ABNORMAL LOW (ref 0.450–4.500)

## 2017-12-31 MED ORDER — LEVOTHYROXINE SODIUM 50 MCG PO TABS: 50.0000 ug | ORAL_TABLET | Freq: Every day | ORAL | 3 refills | Status: DC

## 2018-02-06 DIAGNOSIS — J209 Acute bronchitis, unspecified: Secondary | ICD-10-CM | POA: Diagnosis not present

## 2018-02-10 DIAGNOSIS — R05 Cough: Secondary | ICD-10-CM | POA: Diagnosis not present

## 2018-02-10 DIAGNOSIS — J22 Unspecified acute lower respiratory infection: Secondary | ICD-10-CM | POA: Diagnosis not present

## 2018-02-12 ENCOUNTER — Ambulatory Visit: Payer: 59 | Admitting: Family Medicine

## 2018-02-12 ENCOUNTER — Encounter: Payer: Self-pay | Admitting: Family Medicine

## 2018-02-12 VITALS — BP 133/89 | HR 113 | Temp 98.2°F | Ht 58.5 in | Wt 192.6 lb

## 2018-02-12 DIAGNOSIS — J069 Acute upper respiratory infection, unspecified: Secondary | ICD-10-CM

## 2018-02-12 NOTE — Progress Notes (Signed)
BP 133/89   Pulse (!) 113   Temp 98.2 F (36.8 C) (Oral)   Ht 4' 10.5" (1.486 m)   Wt 87.4 kg   SpO2 97%   BMI 39.57 kg/m    Subjective:    Patient ID: Beverly Atkinson, female    DOB: 12/01/1980, 38 y.o.   MRN: [id number]  HPI: Beverly Bracketmanda Kuss is a 38 y.o. female presenting on 02/12/2018 for Cough (x 8 days . Patient was in [place] and went to the urgent care monday and she was given Keflex 200 TID); Nasal Congestion; and Wheezing   HPI Patient is coming in for URI symptoms for the last 2 weeks. She has a productive cough, rhinorrhea, congestion and ear pain. The cough is affecting her sleep. Her fever was 100 degrees the first few days and then started coming down to ~99. She was around sick contacts before the start of it, then traveled to [place] and was around more sick family members. She went to Urgent Care 2 days ago and was given a 10-day course of Keflex. It has not gotten better since then. She wants to confirm she is on the right antibiotics so she can get back to work soon.  Relevant past medical, surgical, family and social history reviewed and updated as indicated. Interim medical history since our last visit reviewed. Allergies and medications reviewed and updated.  Review of Systems  Constitutional: Positive for fever. Negative for chills.  HENT: Positive for congestion, ear pain, postnasal drip, rhinorrhea and sinus pressure. Negative for hearing loss.   Eyes: Eye pain: pressure behind left eye.  Respiratory: Positive for cough. Negative for chest tightness and shortness of breath.   Cardiovascular: Negative for chest pain.  Gastrointestinal: Negative.     Per HPI unless specifically indicated above      Objective:    BP 133/89   Pulse (!) 113   Temp 98.2 F (36.8 C) (Oral)   Ht 4' 10.5" (1.486 m)   Wt 87.4 kg   SpO2 97%   BMI 39.57 kg/m   Wt Readings from Last 3 Encounters:  02/12/18 87.4 kg  12/30/17 89.1 kg  09/27/17 86.1 kg    Physical  Exam Constitutional:      General: She is not in acute distress. HENT:     Right Ear: Tympanic membrane, ear canal and external ear normal.     Left Ear: Tympanic membrane, ear canal and external ear normal.     Nose: Congestion and rhinorrhea present.     Mouth/Throat:     Mouth: Mucous membranes are moist.     Pharynx: Posterior oropharyngeal erythema present. No oropharyngeal exudate.  Cardiovascular:     Rate and Rhythm: Normal rate and regular rhythm.     Heart sounds: Normal heart sounds.  Pulmonary:     Breath sounds: Normal breath sounds. No wheezing, rhonchi or rales.        Assessment & Plan:   Problem List Items Addressed This Visit    None    Visit Diagnoses    Upper respiratory tract infection, unspecified type    -  Primary       Follow up plan:  Use OTC nasal spray and cough suppressant and continue Keflex. Return if symptoms worsen or fail to improve.  Counseling provided for all of the vaccine components No orders of the defined types were placed in this encounter.   Billey ChangAudrey De HermanLos Reyes, PA-S [facility] 02/12/2018, 5:18 PM  Patient seen and examined with PA student, agree with assessment and plan above, have patient continue her Keflex and other over-the-counter medicines including Flonase and nasal Zicam and Delsym Arville Care, MD [place] [facility] 02/12/2018, 6:10 PM

## 2018-04-02 ENCOUNTER — Encounter: Payer: Self-pay | Admitting: Family

## 2018-04-02 ENCOUNTER — Ambulatory Visit (INDEPENDENT_AMBULATORY_CARE_PROVIDER_SITE_OTHER): Payer: Self-pay | Admitting: Family

## 2018-04-02 ENCOUNTER — Other Ambulatory Visit: Payer: Self-pay

## 2018-04-02 VITALS — BP 111/79 | HR 111 | Temp 98.7°F | Ht 58.5 in | Wt 195.6 lb

## 2018-04-02 DIAGNOSIS — F325 Major depressive disorder, single episode, in full remission: Secondary | ICD-10-CM

## 2018-04-02 DIAGNOSIS — M545 Low back pain: Secondary | ICD-10-CM

## 2018-04-02 DIAGNOSIS — Z0289 Encounter for other administrative examinations: Secondary | ICD-10-CM

## 2018-04-02 DIAGNOSIS — F1129 Opioid dependence with unspecified opioid-induced disorder: Secondary | ICD-10-CM

## 2018-04-02 DIAGNOSIS — I10 Essential (primary) hypertension: Secondary | ICD-10-CM

## 2018-04-02 DIAGNOSIS — G8929 Other chronic pain: Secondary | ICD-10-CM

## 2018-04-02 DIAGNOSIS — G4700 Insomnia, unspecified: Secondary | ICD-10-CM

## 2018-04-02 DIAGNOSIS — E039 Hypothyroidism, unspecified: Secondary | ICD-10-CM

## 2018-04-02 DIAGNOSIS — G2581 Restless legs syndrome: Secondary | ICD-10-CM

## 2018-04-02 MED ORDER — TRAMADOL HCL 50 MG PO TABS: 50.0000 mg | ORAL_TABLET | Freq: Two times a day (BID) | ORAL | 2 refills | Status: DC | PRN

## 2018-04-02 MED ORDER — TEMAZEPAM 30 MG PO CAPS: 30.0000 mg | ORAL_CAPSULE | Freq: Every evening | ORAL | 1 refills | Status: DC | PRN

## 2018-04-02 NOTE — Progress Notes (Signed)
Subjective:    Patient ID: Beverly Atkinson, female    DOB: Apr 01, 1980, 38 y.o.   MRN: [id number]    Chief Complaint  Patient presents with  . Medical Management of Chronic Issues   Pt presents to the office today for chronic follow up and pain medication refill.  Hypertension  This is a chronic problem. The current episode started more than 1 year ago. The problem has been resolved since onset. The problem is controlled. Pertinent negatives include no chest pain, headaches, malaise/fatigue, peripheral edema or shortness of breath. Risk factors for coronary artery disease include obesity and sedentary lifestyle. The current treatment provides moderate improvement. Identifiable causes of hypertension include a thyroid problem.  Thyroid Problem  Presents for follow-up visit. Patient reports no depressed mood, diaphoresis, diarrhea or fatigue. The symptoms have been stable.  Back Pain  This is a chronic problem. The current episode started more than 1 year ago. The problem occurs intermittently. The problem has been waxing and waning since onset. The pain is present in the lumbar spine. The pain is at a severity of 4/10. The pain is moderate. Pertinent negatives include no bladder incontinence, bowel incontinence, chest pain or headaches. Risk factors include obesity and lack of exercise. The treatment provided mild relief.  Insomnia  Primary symptoms: difficulty falling asleep, frequent awakening, no malaise/fatigue.  The current episode started more than one year. The onset quality is gradual. The problem occurs intermittently. The problem has been waxing and waning since onset. PMH includes: depression.  Depression         This is a chronic problem.  The current episode started more than 1 year ago.   The onset quality is gradual.   The problem occurs intermittently.  The problem has been waxing and waning since onset.  Associated symptoms include insomnia, decreased interest and sad.  Associated  symptoms include no fatigue and no headaches.  Past medical history includes thyroid problem.       Review of Systems  Constitutional: Negative for diaphoresis, fatigue and malaise/fatigue.  Respiratory: Negative for shortness of breath.   Cardiovascular: Negative for chest pain.  Gastrointestinal: Negative for bowel incontinence and diarrhea.  Genitourinary: Negative for bladder incontinence.  Musculoskeletal: Positive for back pain.  Neurological: Negative for headaches.  Psychiatric/Behavioral: Positive for depression. The patient has insomnia.   All other systems reviewed and are negative.      Objective:   Physical Exam Vitals signs reviewed.  Constitutional:      General: She is not in acute distress.    Appearance: She is well-developed.  HENT:     Head: Normocephalic and atraumatic.     Right Ear: Tympanic membrane normal.     Left Ear: Tympanic membrane normal.  Eyes:     Pupils: Pupils are equal, round, and reactive to light.  Neck:     Musculoskeletal: Normal range of motion and neck supple.     Thyroid: No thyromegaly.  Cardiovascular:     Rate and Rhythm: Normal rate and regular rhythm.     Heart sounds: Normal heart sounds. No murmur.  Pulmonary:     Effort: Pulmonary effort is normal. No respiratory distress.     Breath sounds: Normal breath sounds. No wheezing.  Abdominal:     General: Bowel sounds are normal. There is no distension.     Palpations: Abdomen is soft.     Tenderness: There is no abdominal tenderness.  Musculoskeletal:        General: No  tenderness.     Comments: Mild lumbar with flexion  Skin:    General: Skin is warm and dry.  Neurological:     Mental Status: She is alert and oriented to person, place, and time.     Cranial Nerves: No cranial nerve deficit.     Deep Tendon Reflexes: Reflexes are normal and symmetric.  Psychiatric:        Behavior: Behavior normal.        Thought Content: Thought content normal.        Judgment:  Judgment normal.       BP 111/79   Pulse (!) 111   Temp 98.7 F (37.1 C) (Oral)   Ht 4' 10.5" (1.486 m)   Wt 195 lb 9.6 oz (88.7 kg)   LMP 04/02/2018   BMI 40.18 kg/m      Assessment & Plan:  Beverly Atkinson comes in today with chief complaint of Medical Management of Chronic Issues   Diagnosis and orders addressed:  1. Essential hypertension  2. Primary hypothyroidism  3. Chronic bilateral low back pain without sciatica - traMADol (ULTRAM) 50 MG tablet; Take 1 tablet (50 mg total) by mouth every 12 (twelve) hours as needed.  Dispense: 30 tablet; Refill: 2  4. Insomnia, unspecified type - temazepam (RESTORIL) 30 MG capsule; Take 1 capsule (30 mg total) by mouth at bedtime as needed for sleep.  Dispense: 90 capsule; Refill: 1  5. Major depressive disorder with single episode, in full remission (HCC  6. Morbid obesity (HCC)  7. Opioid dependence with opioid-induced disorder (HCC) - traMADol (ULTRAM) 50 MG tablet; Take 1 tablet (50 mg total) by mouth every 12 (twelve) hours as needed.  Dispense: 30 tablet; Refill: 2  8. Pain management contract signed  - traMADol (ULTRAM) 50 MG tablet; Take 1 tablet (50 mg total) by mouth every 12 (twelve) hours as needed.  Dispense: 30 tablet; Refill: 2  9. Restless leg syndrom - temazepam (RESTORIL) 30 MG capsule; Take 1 capsule (30 mg total) by mouth at bedtime as needed for sleep.  Dispense: 90 capsule; Refill: 1   Labs pending [place] controlled database reviewed- No red flags Health Maintenance reviewed Diet and exercise encouraged  Follow up plan: 3 months    Jannifer Rodney, FNP

## 2018-04-02 NOTE — Patient Instructions (Signed)

## 2018-07-04 ENCOUNTER — Ambulatory Visit: Payer: Self-pay | Admitting: Family

## 2018-07-07 ENCOUNTER — Telehealth: Payer: Self-pay | Admitting: Family

## 2018-07-07 NOTE — Telephone Encounter (Signed)
Does the provider order this or can patient go anyway.

## 2018-07-07 NOTE — Telephone Encounter (Signed)
PT is wanting an order to be tested for COVID 19  at [facility] in [place] that a co worker has tested positive an the pt does work with home health, pt states that she isn't presenting any symptoms but would really like to be tested. Advised pt I would send message to see if this would be possible to do.

## 2018-07-07 NOTE — Telephone Encounter (Signed)
Please advise 

## 2018-07-08 NOTE — Telephone Encounter (Signed)
She can go to [facility] and get tested if they are doing them. She can also contact the [facility] and get a test there.

## 2018-07-08 NOTE — Telephone Encounter (Signed)
Left provider's advice on testing.

## 2018-07-14 ENCOUNTER — Other Ambulatory Visit: Payer: Self-pay | Admitting: Family

## 2018-07-15 ENCOUNTER — Ambulatory Visit: Payer: 59 | Admitting: Family

## 2018-07-15 ENCOUNTER — Encounter: Payer: Self-pay | Admitting: Family

## 2018-07-15 ENCOUNTER — Other Ambulatory Visit: Payer: Self-pay

## 2018-07-15 VITALS — BP 112/77 | HR 99 | Temp 98.4°F | Ht 58.5 in | Wt 197.6 lb

## 2018-07-15 DIAGNOSIS — M545 Low back pain, unspecified: Secondary | ICD-10-CM

## 2018-07-15 DIAGNOSIS — G2581 Restless legs syndrome: Secondary | ICD-10-CM

## 2018-07-15 DIAGNOSIS — I10 Essential (primary) hypertension: Secondary | ICD-10-CM

## 2018-07-15 DIAGNOSIS — Z0289 Encounter for other administrative examinations: Secondary | ICD-10-CM

## 2018-07-15 DIAGNOSIS — E039 Hypothyroidism, unspecified: Secondary | ICD-10-CM

## 2018-07-15 DIAGNOSIS — G4700 Insomnia, unspecified: Secondary | ICD-10-CM

## 2018-07-15 DIAGNOSIS — F325 Major depressive disorder, single episode, in full remission: Secondary | ICD-10-CM

## 2018-07-15 DIAGNOSIS — F1129 Opioid dependence with unspecified opioid-induced disorder: Secondary | ICD-10-CM

## 2018-07-15 DIAGNOSIS — G8929 Other chronic pain: Secondary | ICD-10-CM

## 2018-07-15 DIAGNOSIS — E785 Hyperlipidemia, unspecified: Secondary | ICD-10-CM | POA: Insufficient documentation

## 2018-07-15 MED ORDER — TEMAZEPAM 30 MG PO CAPS: 30.0000 mg | ORAL_CAPSULE | Freq: Every evening | ORAL | 1 refills | Status: DC | PRN

## 2018-07-15 MED ORDER — TRAMADOL HCL 50 MG PO TABS: 50.0000 mg | ORAL_TABLET | Freq: Two times a day (BID) | ORAL | 2 refills | Status: DC | PRN

## 2018-07-15 NOTE — Progress Notes (Signed)
Subjective:    Patient ID: Beverly Atkinson, female    DOB: Apr 13, 1980, 38 y.o.   MRN: [id number]  Chief Complaint  Patient presents with  . Medical Management of Chronic Issues   Pt presents to the office today for chronic follow up and pain medication refill. Hypertension This is a chronic problem. The current episode started more than 1 year ago. The problem has been resolved since onset. The problem is controlled. Associated symptoms include malaise/fatigue. Pertinent negatives include no peripheral edema, shortness of breath or sweats. Risk factors for coronary artery disease include dyslipidemia and obesity. The current treatment provides moderate improvement. There is no history of kidney disease or CAD/MI. Identifiable causes of hypertension include a thyroid problem.  Thyroid Problem Presents for follow-up visit. Symptoms include constipation and fatigue. The symptoms have been stable. Her past medical history is significant for hyperlipidemia.  Depression        This is a chronic problem.  The current episode started more than 1 year ago.   The onset quality is gradual.   The problem occurs intermittently.  The problem has been waxing and waning since onset.  Associated symptoms include fatigue, insomnia, irritable, restlessness and sad.  Associated symptoms include no helplessness and no hopelessness.  Past treatments include SSRIs - Selective serotonin reuptake inhibitors.  Past medical history includes thyroid problem.   Back Pain This is a chronic problem. The current episode started more than 1 year ago. The problem occurs intermittently. The problem has been waxing and waning since onset. The pain is present in the lumbar spine. The quality of the pain is described as aching. The pain is at a severity of 2/10. The pain is mild. Associated symptoms include leg pain. Risk factors include obesity. She has tried analgesics for the symptoms. The treatment provided mild relief.  Insomnia  Primary symptoms: difficulty falling asleep, frequent awakening, malaise/fatigue.  The current episode started more than one year. The onset quality is gradual. The problem occurs intermittently. PMH includes: depression.  Hyperlipidemia This is a chronic problem. The current episode started more than 1 year ago. The problem is uncontrolled. Recent lipid tests were reviewed and are normal. Exacerbating diseases include obesity. Associated symptoms include leg pain. Pertinent negatives include no shortness of breath.    Current opioids rx- Ultram 50 mg # meds rx- 30 Effectiveness of current meds-Stable Adverse reactions form pain meds-None Morphine equivalent- 5 mmme/day  Pill count performed-No Last drug screen - 09/27/17 ( high risk q85m moderate risk q62mlow risk yearly ) Urine drug screen today- No [place]  If yes were their any concerning findings? - none  No flowsheet data found.   Pain contract signed on:04/04/18    Review of Systems  Constitutional: Positive for fatigue and malaise/fatigue.  Respiratory: Negative for shortness of breath.   Gastrointestinal: Positive for constipation.  Musculoskeletal: Positive for back pain.  Psychiatric/Behavioral: Positive for depression. The patient has insomnia.   All other systems reviewed and are negative.      Objective:   Physical Exam Vitals signs reviewed.  Constitutional:      General: She is irritable. She is not in acute distress.    Appearance: She is well-developed.  HENT:     Head: Normocephalic and atraumatic.     Right Ear: Tympanic membrane normal.     Left Ear: Tympanic membrane normal.  Eyes:     Pupils: Pupils are equal, round, and reactive to light.  Neck:  Musculoskeletal: Normal range of motion and neck supple.     Thyroid: No thyromegaly.  Cardiovascular:     Rate and Rhythm: Normal rate and regular rhythm.     Heart sounds: Normal heart sounds. No murmur.  Pulmonary:      Effort: Pulmonary effort is normal. No respiratory distress.     Breath sounds: Normal breath sounds. No wheezing.  Abdominal:     General: Bowel sounds are normal. There is no distension.     Palpations: Abdomen is soft.     Tenderness: There is no abdominal tenderness.  Musculoskeletal: Normal range of motion.        General: No tenderness.  Skin:    General: Skin is warm and dry.  Neurological:     Mental Status: She is alert and oriented to person, place, and time.     Cranial Nerves: No cranial nerve deficit.     Deep Tendon Reflexes: Reflexes are normal and symmetric.  Psychiatric:        Behavior: Behavior normal.        Thought Content: Thought content normal.        Judgment: Judgment normal.       BP 112/77   Pulse 99   Temp 98.4 F (36.9 C) (Oral)   Ht 4' 10.5" (1.486 m)   Wt 197 lb 9.6 oz (89.6 kg)   BMI 40.60 kg/m       Assessment & Plan:  Beverly Atkinson comes in today with chief complaint of Medical Management of Chronic Issues   Diagnosis and orders addressed:  1. Essential hypertension - CMP14+EGFR - CBC with Differential/Platelet  2. Primary hypothyroidism - CMP14+EGFR - CBC with Differential/Platelet - TSH  3. Pain management contract signed - CMP14+EGFR - CBC with Differential/Platelet - traMADol (ULTRAM) 50 MG tablet; Take 1 tablet (50 mg total) by mouth every 12 (twelve) hours as needed.  Dispense: 30 tablet; Refill: 2  4. Opioid dependence with opioid-induced disorder (HCC - CMP14+EGFR - CBC with Differential/Platelet - traMADol (ULTRAM) 50 MG tablet; Take 1 tablet (50 mg total) by mouth every 12 (twelve) hours as needed.  Dispense: 30 tablet; Refill: 2  5. Morbid obesity ([place]) - CMP14+EGFR - CBC with Differential/Platelet  6. Major depressive disorder with single episode, in full remission ([place]) - CMP14+EGFR - CBC with Differential/Platelet  7. Insomnia, unspecified type  - CMP14+EGFR - CBC with Differential/Platelet -  temazepam (RESTORIL) 30 MG capsule; Take 1 capsule (30 mg total) by mouth at bedtime as needed for sleep.  Dispense: 90 capsule; Refill: 1  8. Chronic bilateral low back pain without sciatica - CMP14+EGFR - CBC with Differential/Platelet - traMADol (ULTRAM) 50 MG tablet; Take 1 tablet (50 mg total) by mouth every 12 (twelve) hours as needed.  Dispense: 30 tablet; Refill: 2  9. Hyperlipidemia, unspecified hyperlipidemia type - CMP14+EGFR - CBC with Differential/Platelet - Lipid panel  10. Restless leg syndrome - temazepam (RESTORIL) 30 MG capsule; Take 1 capsule (30 mg total) by mouth at bedtime as needed for sleep.  Dispense: 90 capsule; Refill: 1   Labs pending Health Maintenance reviewed Diet and exercise encouraged  Follow up plan: 3 months    Evelina Dun, FNP

## 2018-07-15 NOTE — Patient Instructions (Signed)

## 2018-07-16 LAB — CBC WITH DIFFERENTIAL/PLATELET
Basophils Absolute: 0 10*3/uL (ref 0.0–0.2)
Basos: 0 %
EOS (ABSOLUTE): 0.1 10*3/uL (ref 0.0–0.4)
Eos: 1 %
Hematocrit: 43.3 % (ref 34.0–46.6)
Hemoglobin: 14.1 g/dL (ref 11.1–15.9)
Immature Grans (Abs): 0 10*3/uL (ref 0.0–0.1)
Immature Granulocytes: 0 %
Lymphocytes Absolute: 2.1 10*3/uL (ref 0.7–3.1)
Lymphs: 23 %
MCH: 29.7 pg (ref 26.6–33.0)
MCHC: 32.6 g/dL (ref 31.5–35.7)
MCV: 91 fL (ref 79–97)
Monocytes Absolute: 0.4 10*3/uL (ref 0.1–0.9)
Monocytes: 5 %
Neutrophils Absolute: 6.7 10*3/uL (ref 1.4–7.0)
Neutrophils: 71 %
Platelets: 394 10*3/uL (ref 150–450)
RBC: 4.75 x10E6/uL (ref 3.77–5.28)
RDW: 13.2 % (ref 11.7–15.4)
WBC: 9.4 10*3/uL (ref 3.4–10.8)

## 2018-07-16 LAB — CMP14+EGFR
ALT: 17 IU/L (ref 0–32)
AST: 19 IU/L (ref 0–40)
Albumin/Globulin Ratio: 1.7 (ref 1.2–2.2)
Albumin: 4.2 g/dL (ref 3.8–4.8)
Alkaline Phosphatase: 92 IU/L (ref 39–117)
BUN/Creatinine Ratio: 10 (ref 9–23)
BUN: 8 mg/dL (ref 6–20)
Bilirubin Total: 0.4 mg/dL (ref 0.0–1.2)
CO2: 21 mmol/L (ref 20–29)
Calcium: 9.3 mg/dL (ref 8.7–10.2)
Chloride: 104 mmol/L (ref 96–106)
Creatinine, Ser: 0.78 mg/dL (ref 0.57–1.00)
GFR calc Af Amer: 111 mL/min/{1.73_m2} (ref >59)
GFR calc non Af Amer: 96 mL/min/{1.73_m2} (ref >59)
Globulin, Total: 2.5 g/dL (ref 1.5–4.5)
Glucose: 144 mg/dL — ABNORMAL HIGH (ref 65–99)
Potassium: 4.1 mmol/L (ref 3.5–5.2)
Sodium: 137 mmol/L (ref 134–144)
Total Protein: 6.7 g/dL (ref 6.0–8.5)

## 2018-07-16 LAB — TSH: TSH: 0.629 u[IU]/mL (ref 0.450–4.500)

## 2018-07-16 LAB — LIPID PANEL
Chol/HDL Ratio: 5.6 ratio — ABNORMAL HIGH (ref 0.0–4.4)
Cholesterol, Total: 214 mg/dL — ABNORMAL HIGH (ref 100–199)
HDL: 38 mg/dL — ABNORMAL LOW (ref >39)
LDL Calculated: 138 mg/dL — ABNORMAL HIGH (ref 0–99)
Triglycerides: 190 mg/dL — ABNORMAL HIGH (ref 0–149)
VLDL Cholesterol Cal: 38 mg/dL (ref 5–40)

## 2018-08-01 ENCOUNTER — Other Ambulatory Visit: Payer: Self-pay | Admitting: Family

## 2018-08-01 DIAGNOSIS — M545 Low back pain, unspecified: Secondary | ICD-10-CM

## 2018-08-01 DIAGNOSIS — G8929 Other chronic pain: Secondary | ICD-10-CM

## 2018-08-01 NOTE — Telephone Encounter (Signed)
Please advise 

## 2018-09-08 ENCOUNTER — Other Ambulatory Visit: Payer: Self-pay | Admitting: Family

## 2018-09-08 DIAGNOSIS — E039 Hypothyroidism, unspecified: Secondary | ICD-10-CM

## 2018-09-24 ENCOUNTER — Other Ambulatory Visit: Payer: Self-pay | Admitting: Family

## 2018-09-24 DIAGNOSIS — E039 Hypothyroidism, unspecified: Secondary | ICD-10-CM

## 2018-09-25 ENCOUNTER — Other Ambulatory Visit: Payer: Self-pay | Admitting: Family

## 2018-09-25 DIAGNOSIS — I10 Essential (primary) hypertension: Secondary | ICD-10-CM

## 2018-10-16 ENCOUNTER — Ambulatory Visit: Payer: 59 | Admitting: Family

## 2018-10-23 ENCOUNTER — Other Ambulatory Visit: Payer: Self-pay

## 2018-10-24 ENCOUNTER — Ambulatory Visit: Payer: 59 | Admitting: Family

## 2018-10-24 ENCOUNTER — Encounter: Payer: Self-pay | Admitting: Family

## 2018-10-24 VITALS — BP 127/83 | HR 84 | Temp 99.1°F | Ht 58.5 in | Wt 198.8 lb

## 2018-10-24 DIAGNOSIS — E039 Hypothyroidism, unspecified: Secondary | ICD-10-CM

## 2018-10-24 DIAGNOSIS — E785 Hyperlipidemia, unspecified: Secondary | ICD-10-CM

## 2018-10-24 DIAGNOSIS — F325 Major depressive disorder, single episode, in full remission: Secondary | ICD-10-CM

## 2018-10-24 DIAGNOSIS — G4700 Insomnia, unspecified: Secondary | ICD-10-CM

## 2018-10-24 DIAGNOSIS — M545 Low back pain, unspecified: Secondary | ICD-10-CM

## 2018-10-24 DIAGNOSIS — G8929 Other chronic pain: Secondary | ICD-10-CM

## 2018-10-24 DIAGNOSIS — I10 Essential (primary) hypertension: Secondary | ICD-10-CM

## 2018-10-24 DIAGNOSIS — G2581 Restless legs syndrome: Secondary | ICD-10-CM

## 2018-10-24 DIAGNOSIS — Z0289 Encounter for other administrative examinations: Secondary | ICD-10-CM | POA: Diagnosis not present

## 2018-10-24 DIAGNOSIS — F1129 Opioid dependence with unspecified opioid-induced disorder: Secondary | ICD-10-CM

## 2018-10-24 MED ORDER — TEMAZEPAM 30 MG PO CAPS: 30.0000 mg | ORAL_CAPSULE | Freq: Every evening | ORAL | 1 refills | Status: DC | PRN

## 2018-10-24 MED ORDER — TRAMADOL HCL 50 MG PO TABS: 50.0000 mg | ORAL_TABLET | Freq: Two times a day (BID) | ORAL | 2 refills | Status: DC | PRN

## 2018-10-24 NOTE — Progress Notes (Signed)
Subjective:    Patient ID: Beverly Atkinson, female    DOB: 01/30/1980, 38 y.o.   MRN: [id number]  Chief Complaint  Patient presents with  . Medical Management of Chronic Issues    refills   Pt presents to the office today for chronic follow up and pain medication refill.  Hypertension This is a chronic problem. The current episode started more than 1 year ago. The problem has been resolved since onset. The problem is controlled. Associated symptoms include headaches. Pertinent negatives include no chest pain, malaise/fatigue, peripheral edema or shortness of breath. Risk factors for coronary artery disease include dyslipidemia, obesity and sedentary lifestyle. The current treatment provides moderate improvement. There is no history of kidney disease, CAD/MI or heart failure. Identifiable causes of hypertension include a thyroid problem.  Thyroid Problem Presents for follow-up visit. Patient reports no constipation, depressed mood, dry skin or fatigue. The symptoms have been stable. Her past medical history is significant for hyperlipidemia. There is no history of heart failure.  Hyperlipidemia This is a chronic problem. The current episode started more than 1 year ago. The problem is uncontrolled. Recent lipid tests were reviewed and are high. Exacerbating diseases include obesity. Pertinent negatives include no chest pain or shortness of breath. Current antihyperlipidemic treatment includes diet change. The current treatment provides no improvement of lipids.  Insomnia Primary symptoms: difficulty falling asleep, frequent awakening, no malaise/fatigue.  The current episode started more than one year. The onset quality is gradual. The problem occurs intermittently. The problem has been waxing and waning since onset.  Back Pain This is a chronic problem. The current episode started more than 1 year ago. The problem occurs intermittently. The problem has been waxing and waning since onset. The pain  is present in the lumbar spine. The quality of the pain is described as aching. The pain is at a severity of 2/10 (8 after working all day). The pain is mild. Associated symptoms include headaches. Pertinent negatives include no bowel incontinence, chest pain, dysuria or numbness. Risk factors include obesity. She has tried analgesics and bed rest for the symptoms. The treatment provided mild relief.      Review of Systems  Constitutional: Negative for fatigue and malaise/fatigue.  Respiratory: Negative for shortness of breath.   Cardiovascular: Negative for chest pain.  Gastrointestinal: Negative for bowel incontinence and constipation.  Genitourinary: Negative for dysuria.  Musculoskeletal: Positive for back pain.  Neurological: Positive for headaches. Negative for numbness.  Psychiatric/Behavioral: The patient has insomnia.   All other systems reviewed and are negative.      Objective:   Physical Exam Vitals signs reviewed.  Constitutional:      General: She is not in acute distress.    Appearance: She is well-developed. She is obese.  HENT:     Head: Normocephalic and atraumatic.     Right Ear: Tympanic membrane normal.     Left Ear: Tympanic membrane normal.  Eyes:     Pupils: Pupils are equal, round, and reactive to light.  Neck:     Musculoskeletal: Normal range of motion and neck supple.     Thyroid: No thyromegaly.  Cardiovascular:     Rate and Rhythm: Normal rate and regular rhythm.     Heart sounds: Normal heart sounds. No murmur.  Pulmonary:     Effort: Pulmonary effort is normal. No respiratory distress.     Breath sounds: Normal breath sounds. No wheezing.  Abdominal:     General: Bowel sounds are normal. There  is no distension.     Palpations: Abdomen is soft.     Tenderness: There is no abdominal tenderness.  Musculoskeletal: Normal range of motion.        General: No tenderness.  Skin:    General: Skin is warm and dry.  Neurological:     Mental Status:  She is alert and oriented to person, place, and time.     Cranial Nerves: No cranial nerve deficit.     Deep Tendon Reflexes: Reflexes are normal and symmetric.  Psychiatric:        Behavior: Behavior normal.        Thought Content: Thought content normal.        Judgment: Judgment normal.     BP 127/83   Pulse 84   Temp 99.1 F (37.3 C) (Temporal) Comment (Src): wearing mask causing elevation  Ht 4' 10.5" (1.486 m)   Wt 198 lb 12.8 oz (90.2 kg)   SpO2 96%   BMI 40.84 kg/m      Assessment & Plan:  Beverly Atkinson comes in today with chief complaint of Medical Management of Chronic Issues (refills)   Diagnosis and orders addressed:  1. Essential hypertension  2. Primary hypothyroidism   3. Pain management contract signed - traMADol (ULTRAM) 50 MG tablet; Take 1 tablet (50 mg total) by mouth every 12 (twelve) hours as needed.  Dispense: 30 tablet; Refill: 2  4. Opioid dependence with opioid-induced disorder (HCC) - traMADol (ULTRAM) 50 MG tablet; Take 1 tablet (50 mg total) by mouth every 12 (twelve) hours as needed.  Dispense: 30 tablet; Refill: 2  5. Morbid obesity ([place])  6. Major depressive disorder with single episode, in full remission ([place])  7. Insomnia, unspecified type - temazepam (RESTORIL) 30 MG capsule; Take 1 capsule (30 mg total) by mouth at bedtime as needed for sleep.  Dispense: 90 capsule; Refill: 1  8. Hyperlipidemia, unspecified hyperlipidemia type  9. Restless leg syndrome - temazepam (RESTORIL) 30 MG capsule; Take 1 capsule (30 mg total) by mouth at bedtime as needed for sleep.  Dispense: 90 capsule; Refill: 1  10. Chronic bilateral low back pain without sciatica - traMADol (ULTRAM) 50 MG tablet; Take 1 tablet (50 mg total) by mouth every 12 (twelve) hours as needed.  Dispense: 30 tablet; Refill: 2   Labs reviewed from last visit. Will hold off on lab work today.  Pt reviewed in [place] controlled database- No red flags noted Health Maintenance  reviewed Diet and exercise encouraged  Follow up plan: 3 months    Evelina Dun, FNP

## 2018-10-24 NOTE — Patient Instructions (Signed)
Hypothyroidism  Hypothyroidism is when the thyroid gland does not make enough of certain hormones (it is underactive). The thyroid gland is a small gland located in the lower front part of the neck, just in front of the windpipe (trachea). This gland makes hormones that help control how the body uses food for energy (metabolism) as well as how the heart and brain function. These hormones also play a role in keeping your bones strong. When the thyroid is underactive, it produces too little of the hormones thyroxine (T4) and triiodothyronine (T3). What are the causes? This condition may be caused by:  Hashimoto's disease. This is a disease in which the body's disease-fighting system (immune system) attacks the thyroid gland. This is the most common cause.  Viral infections.  Pregnancy.  Certain medicines.  Birth defects.  Past radiation treatments to the head or neck for cancer.  Past treatment with radioactive iodine.  Past exposure to radiation in the environment.  Past surgical removal of part or all of the thyroid.  Problems with a gland in the center of the brain (pituitary gland).  Lack of enough iodine in the diet. What increases the risk? You are more likely to develop this condition if:  You are female.  You have a family history of thyroid conditions.  You use a medicine called lithium.  You take medicines that affect the immune system (immunosuppressants). What are the signs or symptoms? Symptoms of this condition include:  Feeling as though you have no energy (lethargy).  Not being able to tolerate cold.  Weight gain that is not explained by a change in diet or exercise habits.  Lack of appetite.  Dry skin.  Coarse hair.  Menstrual irregularity.  Slowing of thought processes.  Constipation.  Sadness or depression. How is this diagnosed? This condition may be diagnosed based on:  Your symptoms, your medical history, and a physical exam.  Blood  tests. You may also have imaging tests, such as an ultrasound or MRI. How is this treated? This condition is treated with medicine that replaces the thyroid hormones that your body does not make. After you begin treatment, it may take several weeks for symptoms to go away. Follow these instructions at home:  Take over-the-counter and prescription medicines only as told by your health care provider.  If you start taking any new medicines, tell your health care provider.  Keep all follow-up visits as told by your health care provider. This is important. ? As your condition improves, your dosage of thyroid hormone medicine may change. ? You will need to have blood tests regularly so that your health care provider can monitor your condition. Contact a health care provider if:  Your symptoms do not get better with treatment.  You are taking thyroid replacement medicine and you: ? Sweat a lot. ? Have tremors. ? Feel anxious. ? Lose weight rapidly. ? Cannot tolerate heat. ? Have emotional swings. ? Have diarrhea. ? Feel weak. Get help right away if you have:  Chest pain.  An irregular heartbeat.  A rapid heartbeat.  Difficulty breathing. Summary  Hypothyroidism is when the thyroid gland does not make enough of certain hormones (it is underactive).  When the thyroid is underactive, it produces too little of the hormones thyroxine (T4) and triiodothyronine (T3).  The most common cause is Hashimoto's disease, a disease in which the body's disease-fighting system (immune system) attacks the thyroid gland. The condition can also be caused by viral infections, medicine, pregnancy, or past   radiation treatment to the head or neck.  Symptoms may include weight gain, dry skin, constipation, feeling as though you do not have energy, and not being able to tolerate cold.  This condition is treated with medicine to replace the thyroid hormones that your body does not make. This information  is not intended to replace advice given to you by your health care provider. Make sure you discuss any questions you have with your health care provider. Document Released: 01/08/2005 Document Revised: 12/21/2016 Document Reviewed: 12/19/2016 Elsevier Patient Education  2020 [employer]  

## 2018-11-05 ENCOUNTER — Other Ambulatory Visit: Payer: Self-pay | Admitting: Family

## 2018-12-22 ENCOUNTER — Other Ambulatory Visit: Payer: Self-pay | Admitting: *Deleted

## 2018-12-22 MED ORDER — LEVOTHYROXINE SODIUM 50 MCG PO TABS: 50.0000 ug | ORAL_TABLET | Freq: Every day | ORAL | 2 refills | Status: DC

## 2019-01-09 ENCOUNTER — Other Ambulatory Visit: Payer: Self-pay | Admitting: Family

## 2019-01-12 NOTE — Telephone Encounter (Signed)
OV 01/30/19

## 2019-01-29 ENCOUNTER — Other Ambulatory Visit: Payer: Self-pay

## 2019-01-30 ENCOUNTER — Encounter: Payer: Self-pay | Admitting: Family

## 2019-01-30 ENCOUNTER — Other Ambulatory Visit: Payer: Self-pay

## 2019-01-30 ENCOUNTER — Ambulatory Visit (INDEPENDENT_AMBULATORY_CARE_PROVIDER_SITE_OTHER): Payer: 59 | Admitting: Family

## 2019-01-30 VITALS — BP 131/89 | HR 89 | Temp 99.3°F | Ht 58.5 in | Wt 192.4 lb

## 2019-01-30 DIAGNOSIS — F325 Major depressive disorder, single episode, in full remission: Secondary | ICD-10-CM

## 2019-01-30 DIAGNOSIS — O99345 Other mental disorders complicating the puerperium: Secondary | ICD-10-CM

## 2019-01-30 DIAGNOSIS — G4700 Insomnia, unspecified: Secondary | ICD-10-CM

## 2019-01-30 DIAGNOSIS — E785 Hyperlipidemia, unspecified: Secondary | ICD-10-CM

## 2019-01-30 DIAGNOSIS — M545 Low back pain: Secondary | ICD-10-CM

## 2019-01-30 DIAGNOSIS — F53 Postpartum depression: Secondary | ICD-10-CM

## 2019-01-30 DIAGNOSIS — F1129 Opioid dependence with unspecified opioid-induced disorder: Secondary | ICD-10-CM

## 2019-01-30 DIAGNOSIS — I10 Essential (primary) hypertension: Secondary | ICD-10-CM

## 2019-01-30 DIAGNOSIS — E039 Hypothyroidism, unspecified: Secondary | ICD-10-CM

## 2019-01-30 DIAGNOSIS — Z0289 Encounter for other administrative examinations: Secondary | ICD-10-CM

## 2019-01-30 DIAGNOSIS — G2581 Restless legs syndrome: Secondary | ICD-10-CM

## 2019-01-30 DIAGNOSIS — G8929 Other chronic pain: Secondary | ICD-10-CM

## 2019-01-30 MED ORDER — TEMAZEPAM 30 MG PO CAPS: 30.0000 mg | ORAL_CAPSULE | Freq: Every evening | ORAL | 1 refills | Status: DC | PRN

## 2019-01-30 MED ORDER — TRAMADOL HCL 50 MG PO TABS: 50.0000 mg | ORAL_TABLET | Freq: Two times a day (BID) | ORAL | 2 refills | Status: DC | PRN

## 2019-01-30 NOTE — Progress Notes (Signed)
Subjective:    Patient ID: Beverly Atkinson, female    DOB: [birth year], 39 y.o.   MRN: [id number]  Chief Complaint  Patient presents with  . Medical Management of Chronic Issues   Pt presents to the office today for chronic follow up and pain medication refill.   Hypertension This is a chronic problem. The current episode started more than 1 year ago. The problem has been resolved since onset. The problem is controlled. Associated symptoms include malaise/fatigue. Pertinent negatives include no headaches, peripheral edema or shortness of breath. Risk factors for coronary artery disease include obesity. The current treatment provides moderate improvement. There is no history of kidney disease, CAD/MI or heart failure. Identifiable causes of hypertension include a thyroid problem.  Thyroid Problem Presents for follow-up visit. Symptoms include constipation and fatigue. Patient reports no diaphoresis or diarrhea. The symptoms have been stable. Her past medical history is significant for hyperlipidemia. There is no history of heart failure.  Hyperlipidemia This is a chronic problem. The current episode started more than 1 year ago. The problem is uncontrolled. Recent lipid tests were reviewed and are high. Exacerbating diseases include obesity. Pertinent negatives include no shortness of breath. Current antihyperlipidemic treatment includes statins and diet change. The current treatment provides no improvement of lipids.  Depression        This is a chronic problem.  The current episode started more than 1 year ago.   The onset quality is gradual.   The problem occurs intermittently.  Associated symptoms include fatigue, insomnia, irritable, restlessness and sad.  Associated symptoms include no helplessness, no hopelessness and no headaches.  Past treatments include SSRIs - Selective serotonin reuptake inhibitors.  Past medical history includes thyroid problem.   Insomnia Primary symptoms: difficulty  falling asleep, frequent awakening, malaise/fatigue.  The current episode started more than one year. The onset quality is gradual. The problem occurs intermittently. PMH includes: depression.  Back Pain This is a chronic problem. The current episode started more than 1 year ago. The problem occurs intermittently. The pain is present in the lumbar spine. The pain is at a severity of 1/10. The pain is mild. Pertinent negatives include no headaches. She has tried analgesics (takes Ultam as needed when having increased pain) for the symptoms. The treatment provided mild relief.      Review of Systems  Constitutional: Positive for fatigue and malaise/fatigue. Negative for diaphoresis.  Respiratory: Negative for shortness of breath.   Gastrointestinal: Positive for constipation. Negative for diarrhea.  Musculoskeletal: Positive for back pain.  Neurological: Negative for headaches.  Psychiatric/Behavioral: Positive for depression. The patient has insomnia.   All other systems reviewed and are negative.      Objective:   Physical Exam Vitals reviewed.  Constitutional:      General: She is irritable. She is not in acute distress.    Appearance: She is well-developed. She is obese.  HENT:     Head: Normocephalic and atraumatic.     Right Ear: Tympanic membrane normal.     Left Ear: Tympanic membrane normal.  Eyes:     Pupils: Pupils are equal, round, and reactive to light.  Neck:     Thyroid: No thyromegaly.  Cardiovascular:     Rate and Rhythm: Normal rate and regular rhythm.     Heart sounds: Normal heart sounds. No murmur.  Pulmonary:     Effort: Pulmonary effort is normal. No respiratory distress.     Breath sounds: Normal breath sounds. No wheezing.  Abdominal:  General: Bowel sounds are normal. There is no distension.     Palpations: Abdomen is soft.     Tenderness: There is no abdominal tenderness.  Musculoskeletal:        General: No tenderness. Normal range of motion.       Cervical back: Normal range of motion and neck supple.  Skin:    General: Skin is warm and dry.  Neurological:     Mental Status: She is alert and oriented to person, place, and time.     Cranial Nerves: No cranial nerve deficit.     Deep Tendon Reflexes: Reflexes are normal and symmetric.  Psychiatric:        Behavior: Behavior normal.        Thought Content: Thought content normal.        Judgment: Judgment normal.       BP 131/89   Pulse 89   Temp 99.3 F (37.4 C) (Temporal)   Ht 4' 10.5" (1.486 m)   Wt 192 lb 6.4 oz (87.3 kg)   SpO2 100%   BMI 39.53 kg/m      Assessment & Plan:  Beverly Atkinson comes in today with chief complaint of Medical Management of Chronic Issues   Diagnosis and orders addressed:  1. Essential hypertension - CMP14+EGFR - CBC with Differential/Platelet  2. Primary hypothyroidism - CMP14+EGFR - CBC with Differential/Platelet - TSH  3. Insomnia, unspecified type - temazepam (RESTORIL) 30 MG capsule; Take 1 capsule (30 mg total) by mouth at bedtime as needed for sleep.  Dispense: 90 capsule; Refill: 1 - CMP14+EGFR - CBC with Differential/Platelet  4. Major depressive disorder with single episode, in full remission ([place]) - CMP14+EGFR - CBC with Differential/Platelet  5. Chronic bilateral low back pain without sciatica - traMADol (ULTRAM) 50 MG tablet; Take 1 tablet (50 mg total) by mouth every 12 (twelve) hours as needed.  Dispense: 30 tablet; Refill: 2 - CMP14+EGFR - CBC with Differential/Platelet  6. Morbid obesity ([place]) - CMP14+EGFR - CBC with Differential/Platelet  7. Pain management contract signed - traMADol (ULTRAM) 50 MG tablet; Take 1 tablet (50 mg total) by mouth every 12 (twelve) hours as needed.  Dispense: 30 tablet; Refill: 2 - CMP14+EGFR - CBC with Differential/Platelet  8. Opioid dependence with opioid-induced disorder (HCC) - traMADol (ULTRAM) 50 MG tablet; Take 1 tablet (50 mg total) by mouth every 12 (twelve)  hours as needed.  Dispense: 30 tablet; Refill: 2 - CMP14+EGFR - CBC with Differential/Platelet  9. Hyperlipidemia, unspecified hyperlipidemia type - CMP14+EGFR - CBC with Differential/Platelet  10. Postpartum depression - CMP14+EGFR - CBC with Differential/Platelet  11. Restless leg syndrome - temazepam (RESTORIL) 30 MG capsule; Take 1 capsule (30 mg total) by mouth at bedtime as needed for sleep.  Dispense: 90 capsule; Refill: 1 - CMP14+EGFR - CBC with Differential/Platelet  Pt reviewed in [place] controlled database- No red flags. Drug screen and contract [facility] pending Health Maintenance reviewed Diet and exercise encouraged  Follow up plan: 3 months    Evelina Dun, FNP

## 2019-01-30 NOTE — Patient Instructions (Signed)
Health Maintenance, Female Adopting a healthy lifestyle and getting preventive care are important in promoting health and wellness. Ask your health care provider about:  The right schedule for you to have regular tests and exams.  Things you can do on your own to prevent diseases and keep yourself healthy. What should I know about diet, weight, and exercise? Eat a healthy diet   Eat a diet that includes plenty of vegetables, fruits, low-fat dairy products, and lean protein.  Do not eat a lot of foods that are high in solid fats, added sugars, or sodium. Maintain a healthy weight Body mass index (BMI) is used to identify weight problems. It estimates body fat based on height and weight. Your health care provider can help determine your BMI and help you achieve or maintain a healthy weight. Get regular exercise Get regular exercise. This is one of the most important things you can do for your health. Most adults should:  Exercise for at least 150 minutes each week. The exercise should increase your heart rate and make you sweat (moderate-intensity exercise).  Do strengthening exercises at least twice a week. This is in addition to the moderate-intensity exercise.  Spend less time sitting. Even light physical activity can be beneficial. Watch cholesterol and blood lipids Have your blood tested for lipids and cholesterol at 41 years of age, then have this test every 5 years. Have your cholesterol levels checked more often if:  Your lipid or cholesterol levels are high.  You are older than 40 years of age.  You are at high risk for heart disease. What should I know about cancer screening? Depending on your health history and family history, you may need to have cancer screening at various ages. This may include screening for:  Breast cancer.  Cervical cancer.  Colorectal cancer.  Skin cancer.  Lung cancer. What should I know about heart disease, diabetes, and high blood  pressure? Blood pressure and heart disease  High blood pressure causes heart disease and increases the risk of stroke. This is more likely to develop in people who have high blood pressure readings, are of African descent, or are overweight.  Have your blood pressure checked: ? Every 3-5 years if you are 18-39 years of age. ? Every year if you are 40 years old or older. Diabetes Have regular diabetes screenings. This checks your fasting blood sugar level. Have the screening done:  Once every three years after age 40 if you are at a normal weight and have a low risk for diabetes.  More often and at a younger age if you are overweight or have a high risk for diabetes. What should I know about preventing infection? Hepatitis B If you have a higher risk for hepatitis B, you should be screened for this virus. Talk with your health care provider to find out if you are at risk for hepatitis B infection. Hepatitis C Testing is recommended for:  Everyone born from 1945 through 1965.  Anyone with known risk factors for hepatitis C. Sexually transmitted infections (STIs)  Get screened for STIs, including gonorrhea and chlamydia, if: ? You are sexually active and are younger than 41 years of age. ? You are older than 41 years of age and your health care provider tells you that you are at risk for this type of infection. ? Your sexual activity has changed since you were last screened, and you are at increased risk for chlamydia or gonorrhea. Ask your health care provider if   you are at risk.  Ask your health care provider about whether you are at high risk for HIV. Your health care provider may recommend a prescription medicine to help prevent HIV infection. If you choose to take medicine to prevent HIV, you should first get tested for HIV. You should then be tested every 3 months for as long as you are taking the medicine. Pregnancy  If you are about to stop having your period (premenopausal) and  you may become pregnant, seek counseling before you get pregnant.  Take 400 to 800 micrograms (mcg) of folic acid every day if you become pregnant.  Ask for birth control (contraception) if you want to prevent pregnancy. Osteoporosis and menopause Osteoporosis is a disease in which the bones lose minerals and strength with aging. This can result in bone fractures. If you are 65 years old or older, or if you are at risk for osteoporosis and fractures, ask your health care provider if you should:  Be screened for bone loss.  Take a calcium or vitamin D supplement to lower your risk of fractures.  Be given hormone replacement therapy (HRT) to treat symptoms of menopause. Follow these instructions at home: Lifestyle  Do not use any products that contain nicotine or tobacco, such as cigarettes, e-cigarettes, and chewing tobacco. If you need help quitting, ask your health care provider.  Do not use street drugs.  Do not share needles.  Ask your health care provider for help if you need support or information about quitting drugs. Alcohol use  Do not drink alcohol if: ? Your health care provider tells you not to drink. ? You are pregnant, may be pregnant, or are planning to become pregnant.  If you drink alcohol: ? Limit how much you use to 0-1 drink a day. ? Limit intake if you are breastfeeding.  Be aware of how much alcohol is in your drink. In the [place] one drink equals one 12 oz bottle of beer (355 mL), one 5 oz glass of wine (148 mL), or one 1 oz glass of hard liquor (44 mL). General instructions  Schedule regular health, dental, and eye exams.  Stay current with your vaccines.  Tell your health care provider if: ? You often feel depressed. ? You have ever been abused or do not feel safe at home. Summary  Adopting a healthy lifestyle and getting preventive care are important in promoting health and wellness.  Follow your health care provider's instructions about healthy  diet, exercising, and getting tested or screened for diseases.  Follow your health care provider's instructions on monitoring your cholesterol and blood pressure. This information is not intended to replace advice given to you by your health care provider. Make sure you discuss any questions you have with your health care provider. Document Revised: 01/01/2018 Document Reviewed: 01/01/2018 Elsevier Patient Education  2020 [employer]  

## 2019-01-31 LAB — CMP14+EGFR
ALT: 16 IU/L (ref 0–32)
AST: 19 IU/L (ref 0–40)
Albumin/Globulin Ratio: 1.6 (ref 1.2–2.2)
Albumin: 4.2 g/dL (ref 3.8–4.8)
Alkaline Phosphatase: 103 IU/L (ref 39–117)
BUN/Creatinine Ratio: 12 (ref 9–23)
BUN: 10 mg/dL (ref 6–24)
Bilirubin Total: 0.3 mg/dL (ref 0.0–1.2)
CO2: 24 mmol/L (ref 20–29)
Calcium: 9 mg/dL (ref 8.7–10.2)
Chloride: 104 mmol/L (ref 96–106)
Creatinine, Ser: 0.81 mg/dL (ref 0.57–1.00)
GFR calc Af Amer: 105 mL/min/{1.73_m2} (ref >59)
GFR calc non Af Amer: 91 mL/min/{1.73_m2} (ref >59)
Globulin, Total: 2.7 g/dL (ref 1.5–4.5)
Glucose: 93 mg/dL (ref 65–99)
Potassium: 4.2 mmol/L (ref 3.5–5.2)
Sodium: 141 mmol/L (ref 134–144)
Total Protein: 6.9 g/dL (ref 6.0–8.5)

## 2019-01-31 LAB — CBC WITH DIFFERENTIAL/PLATELET
Basophils Absolute: 0 10*3/uL (ref 0.0–0.2)
Basos: 0 %
EOS (ABSOLUTE): 0.1 10*3/uL (ref 0.0–0.4)
Eos: 1 %
Hematocrit: 42.2 % (ref 34.0–46.6)
Hemoglobin: 14.5 g/dL (ref 11.1–15.9)
Immature Grans (Abs): 0 10*3/uL (ref 0.0–0.1)
Immature Granulocytes: 0 %
Lymphocytes Absolute: 1.7 10*3/uL (ref 0.7–3.1)
Lymphs: 23 %
MCH: 29.5 pg (ref 26.6–33.0)
MCHC: 34.4 g/dL (ref 31.5–35.7)
MCV: 86 fL (ref 79–97)
Monocytes Absolute: 0.3 10*3/uL (ref 0.1–0.9)
Monocytes: 4 %
Neutrophils Absolute: 5.3 10*3/uL (ref 1.4–7.0)
Neutrophils: 72 %
Platelets: 373 10*3/uL (ref 150–450)
RBC: 4.92 x10E6/uL (ref 3.77–5.28)
RDW: 12.6 % (ref 11.7–15.4)
WBC: 7.5 10*3/uL (ref 3.4–10.8)

## 2019-01-31 LAB — TSH: TSH: 3.77 u[IU]/mL (ref 0.450–4.500)

## 2019-02-02 ENCOUNTER — Telehealth: Payer: Self-pay | Admitting: *Deleted

## 2019-02-02 NOTE — Telephone Encounter (Signed)
VM - pt seen Friday, saw that her TSH was in the up limits of normal was wanting to know if you were going to change her synthroid dose

## 2019-02-03 ENCOUNTER — Telehealth: Payer: Self-pay | Admitting: Family

## 2019-02-03 NOTE — Telephone Encounter (Signed)
Lmtcb.

## 2019-02-03 NOTE — Telephone Encounter (Signed)
Patient aware Beverly Atkinson not changing thyroid med at this time

## 2019-02-03 NOTE — Telephone Encounter (Signed)
TSH normal. No changes at this time.

## 2019-02-06 NOTE — Telephone Encounter (Signed)
Aware, per message left on voice mail, normal results .

## 2019-03-31 ENCOUNTER — Other Ambulatory Visit: Payer: Self-pay | Admitting: Family

## 2019-04-23 ENCOUNTER — Encounter: Payer: Self-pay | Admitting: Family

## 2019-04-23 ENCOUNTER — Ambulatory Visit (INDEPENDENT_AMBULATORY_CARE_PROVIDER_SITE_OTHER): Payer: 59 | Admitting: Family

## 2019-04-23 DIAGNOSIS — E038 Other specified hypothyroidism: Secondary | ICD-10-CM

## 2019-04-23 DIAGNOSIS — F1129 Opioid dependence with unspecified opioid-induced disorder: Secondary | ICD-10-CM | POA: Diagnosis not present

## 2019-04-23 DIAGNOSIS — Z0289 Encounter for other administrative examinations: Secondary | ICD-10-CM

## 2019-04-23 DIAGNOSIS — M545 Low back pain: Secondary | ICD-10-CM | POA: Diagnosis not present

## 2019-04-23 DIAGNOSIS — E063 Autoimmune thyroiditis: Secondary | ICD-10-CM

## 2019-04-23 DIAGNOSIS — F325 Major depressive disorder, single episode, in full remission: Secondary | ICD-10-CM

## 2019-04-23 DIAGNOSIS — G8929 Other chronic pain: Secondary | ICD-10-CM

## 2019-04-23 DIAGNOSIS — G4700 Insomnia, unspecified: Secondary | ICD-10-CM

## 2019-04-23 DIAGNOSIS — G2581 Restless legs syndrome: Secondary | ICD-10-CM

## 2019-04-23 DIAGNOSIS — I10 Essential (primary) hypertension: Secondary | ICD-10-CM | POA: Diagnosis not present

## 2019-04-23 MED ORDER — ONDANSETRON 4 MG PO TBDP: 4.0000 mg | ORAL_TABLET | Freq: Three times a day (TID) | ORAL | 2 refills | Status: DC | PRN

## 2019-04-23 MED ORDER — TRAMADOL HCL 50 MG PO TABS: 50.0000 mg | ORAL_TABLET | Freq: Two times a day (BID) | ORAL | 2 refills | Status: DC | PRN

## 2019-04-23 MED ORDER — TEMAZEPAM 30 MG PO CAPS: 30.0000 mg | ORAL_CAPSULE | Freq: Every evening | ORAL | 1 refills | Status: DC | PRN

## 2019-04-23 MED ORDER — LEVOTHYROXINE SODIUM 50 MCG PO TABS: 50.0000 ug | ORAL_TABLET | Freq: Every day | ORAL | 2 refills | Status: DC

## 2019-04-23 NOTE — Progress Notes (Signed)
Virtual Visit via telephone Note Due to COVID-19 pandemic this visit was conducted virtually. This visit type was conducted due to national recommendations for restrictions regarding the COVID-19 Pandemic (e.g. social distancing, sheltering in place) in an effort to limit this patient's exposure and mitigate transmission in our community. All issues noted in this document were discussed and addressed.  A physical exam was not performed with this format.  I connected with Beverly Atkinson on 04/23/19 at 11:00 AM by telephone and verified that I am speaking with the correct person using two identifiers. Beverly Atkinson is currently located at car  and no one is currently with her  during visit. The provider, Evelina Dun, FNP is located in their office at time of visit.  I discussed the limitations, risks, security and privacy concerns of performing an evaluation and management service by telephone and the availability of in person appointments. I also discussed with the patient that there may be a patient responsible charge related to this service. The patient expressed understanding and agreed to proceed.   History and Present Illness: Pt presents to the office today for chronic follow up and pain medication refill.  Hypertension This is a chronic problem. The current episode started more than 1 year ago. The problem has been resolved since onset. The problem is controlled. Associated symptoms include malaise/fatigue and shortness of breath ("slight wheezing"). Pertinent negatives include no peripheral edema. Risk factors for coronary artery disease include obesity, sedentary lifestyle and dyslipidemia. The current treatment provides moderate improvement. There is no history of kidney disease, CVA or heart failure. Identifiable causes of hypertension include a thyroid problem.  Thyroid Problem Presents for follow-up visit. Symptoms include fatigue. Patient reports no constipation, diarrhea or dry skin.  The symptoms have been stable. There is no history of heart failure.  Insomnia Primary symptoms: difficulty falling asleep, frequent awakening, malaise/fatigue.  The current episode started more than one year. The problem occurs intermittently. Past treatments include medication. The treatment provided moderate relief.  Back Pain This is a chronic problem. The current episode started more than 1 year ago. The problem occurs intermittently. The problem has been waxing and waning since onset. The pain is present in the lumbar spine. The quality of the pain is described as aching. The pain is at a severity of 3/10. The pain is mild. The symptoms are aggravated by twisting and standing. Risk factors include obesity. She has tried muscle relaxant and bed rest for the symptoms. The treatment provided mild relief.      Review of Systems  Constitutional: Positive for fatigue and malaise/fatigue.  Respiratory: Positive for shortness of breath ("slight wheezing").   Gastrointestinal: Negative for constipation and diarrhea.  Musculoskeletal: Positive for back pain.  Psychiatric/Behavioral: The patient has insomnia.   All other systems reviewed and are negative.    Observations/Objective: No SOB or distress noted   Assessment and Plan: 1. Chronic bilateral low back pain without sciatica - traMADol (ULTRAM) 50 MG tablet; Take 1 tablet (50 mg total) by mouth every 12 (twelve) hours as needed.  Dispense: 30 tablet; Refill: 2  2. Pain management contract signed - traMADol (ULTRAM) 50 MG tablet; Take 1 tablet (50 mg total) by mouth every 12 (twelve) hours as needed.  Dispense: 30 tablet; Refill: 2  3. Opioid dependence with opioid-induced disorder (HCC) - traMADol (ULTRAM) 50 MG tablet; Take 1 tablet (50 mg total) by mouth every 12 (twelve) hours as needed.  Dispense: 30 tablet; Refill: 2  4. Insomnia,  unspecified type - temazepam (RESTORIL) 30 MG capsule; Take 1 capsule (30 mg total) by mouth at  bedtime as needed for sleep.  Dispense: 90 capsule; Refill: 1  5. Restless leg syndrome - temazepam (RESTORIL) 30 MG capsule; Take 1 capsule (30 mg total) by mouth at bedtime as needed for sleep.  Dispense: 90 capsule; Refill: 1  6. Essential hypertension  7. Hypothyroidism due to Hashimoto's thyroiditis - levothyroxine (SYNTHROID) 50 MCG tablet; Take 1 tablet (50 mcg total) by mouth daily.  Dispense: 90 tablet; Refill: 2  8. Major depressive disorder with single episode, in full remission (HCC)  9. Morbid obesity (HCC)  Pt reviewed in  controlled database- No red flags noted, will update contract and drug screen on next visit Continue medications Encouraged healthy diet and exercise  RTO in 3 months     I discussed the assessment and treatment plan with the patient. The patient was provided an opportunity to ask questions and all were answered. The patient agreed with the plan and demonstrated an understanding of the instructions.   The patient was advised to call back or seek an in-person evaluation if the symptoms worsen or if the condition fails to improve as anticipated.  The above assessment and management plan was discussed with the patient. The patient verbalized understanding of and has agreed to the management plan. Patient is aware to call the clinic if symptoms persist or worsen. Patient is aware when to return to the clinic for a follow-up visit. Patient educated on when it is appropriate to go to the emergency department.   Time call ended:  11:22 Am  I provided 22 minutes of non-face-to-face time during this encounter.    Jannifer Rodney, FNP

## 2019-05-01 ENCOUNTER — Ambulatory Visit: Payer: 59 | Admitting: Family

## 2019-05-07 ENCOUNTER — Other Ambulatory Visit: Payer: Self-pay | Admitting: Family

## 2019-05-07 DIAGNOSIS — G8929 Other chronic pain: Secondary | ICD-10-CM

## 2019-06-20 ENCOUNTER — Other Ambulatory Visit: Payer: Self-pay | Admitting: Family

## 2019-08-06 LAB — HM PAP SMEAR

## 2019-08-10 ENCOUNTER — Emergency Department (HOSPITAL_COMMUNITY)
Admission: EM | Admit: 2019-08-10 | Discharge: 2019-08-10 | Disposition: A | Discharge status: Home / Self Care | Payer: 59 | Attending: Emergency Medicine | Admitting: Emergency Medicine

## 2019-08-10 ENCOUNTER — Encounter (HOSPITAL_COMMUNITY): Payer: Self-pay | Admitting: *Deleted

## 2019-08-10 ENCOUNTER — Other Ambulatory Visit: Payer: Self-pay

## 2019-08-10 ENCOUNTER — Emergency Department (HOSPITAL_COMMUNITY): Payer: 59

## 2019-08-10 DIAGNOSIS — K5900 Constipation, unspecified: Secondary | ICD-10-CM | POA: Diagnosis not present

## 2019-08-10 DIAGNOSIS — Z79899 Other long term (current) drug therapy: Secondary | ICD-10-CM | POA: Insufficient documentation

## 2019-08-10 DIAGNOSIS — R1030 Lower abdominal pain, unspecified: Secondary | ICD-10-CM | POA: Diagnosis present

## 2019-08-10 DIAGNOSIS — I10 Essential (primary) hypertension: Secondary | ICD-10-CM | POA: Diagnosis not present

## 2019-08-10 DIAGNOSIS — E039 Hypothyroidism, unspecified: Secondary | ICD-10-CM | POA: Diagnosis not present

## 2019-08-10 LAB — CBC WITH DIFFERENTIAL/PLATELET
Abs Immature Granulocytes: 0.04 10*3/uL (ref 0.00–0.07)
Basophils Absolute: 0 10*3/uL (ref 0.0–0.1)
Basophils Relative: 0 %
Eosinophils Absolute: 0.2 10*3/uL (ref 0.0–0.5)
Eosinophils Relative: 2 %
HCT: 41.7 % (ref 36.0–46.0)
Hemoglobin: 14 g/dL (ref 12.0–15.0)
Immature Granulocytes: 0 %
Lymphocytes Relative: 21 %
Lymphs Abs: 1.9 10*3/uL (ref 0.7–4.0)
MCH: 29.9 pg (ref 26.0–34.0)
MCHC: 33.6 g/dL (ref 30.0–36.0)
MCV: 88.9 fL (ref 80.0–100.0)
Monocytes Absolute: 0.4 10*3/uL (ref 0.1–1.0)
Monocytes Relative: 4 %
Neutro Abs: 6.5 10*3/uL (ref 1.7–7.7)
Neutrophils Relative %: 73 %
Platelets: 382 10*3/uL (ref 150–400)
RBC: 4.69 MIL/uL (ref 3.87–5.11)
RDW: 13.1 % (ref 11.5–15.5)
WBC: 9 10*3/uL (ref 4.0–10.5)
nRBC: 0 % (ref 0.0–0.2)

## 2019-08-10 LAB — URINALYSIS, ROUTINE W REFLEX MICROSCOPIC
Bilirubin Urine: NEGATIVE
Glucose, UA: NEGATIVE mg/dL
Ketones, ur: NEGATIVE mg/dL
Leukocytes,Ua: NEGATIVE
Nitrite: NEGATIVE
Protein, ur: NEGATIVE mg/dL
Specific Gravity, Urine: 1.004 — ABNORMAL LOW (ref 1.005–1.030)
pH: 6 (ref 5.0–8.0)

## 2019-08-10 LAB — COMPREHENSIVE METABOLIC PANEL
ALT: 19 U/L (ref 0–44)
AST: 21 U/L (ref 15–41)
Albumin: 4.3 g/dL (ref 3.5–5.0)
Alkaline Phosphatase: 78 U/L (ref 38–126)
Anion gap: 10 (ref 5–15)
BUN: 13 mg/dL (ref 6–20)
CO2: 26 mmol/L (ref 22–32)
Calcium: 9.1 mg/dL (ref 8.9–10.3)
Chloride: 105 mmol/L (ref 98–111)
Creatinine, Ser: 0.87 mg/dL (ref 0.44–1.00)
GFR calc Af Amer: >60 mL/min (ref >60)
GFR calc non Af Amer: >60 mL/min (ref >60)
Glucose, Bld: 101 mg/dL — ABNORMAL HIGH (ref 70–99)
Potassium: 3.7 mmol/L (ref 3.5–5.1)
Sodium: 141 mmol/L (ref 135–145)
Total Bilirubin: 0.4 mg/dL (ref 0.3–1.2)
Total Protein: 7.5 g/dL (ref 6.5–8.1)

## 2019-08-10 LAB — LIPASE, BLOOD: Lipase: 25 U/L (ref 11–51)

## 2019-08-10 LAB — PREGNANCY, URINE: Preg Test, Ur: NEGATIVE

## 2019-08-10 MED ORDER — IOHEXOL 300 MG/ML  SOLN
100.0000 mL | Freq: Once | INTRAMUSCULAR | Status: AC | PRN
  Administered 2019-08-10: 100 mL via INTRAVENOUS

## 2019-08-10 NOTE — ED Triage Notes (Signed)
Low abdominal pain for 2 weeks, states she needs a CT

## 2019-08-10 NOTE — Discharge Instructions (Signed)
The CT scan of your abdomen and pelvis today shows that you have some constipation.  Try taking MiraLAX, 1 heaping capful mixed in 8 to 10 ounces of water or juice 1-2 times daily until improvement of your constipation.  Is important that you continue to drink plenty of fluids.  Follow-up with your primary care provider for recheck, return emergency department if you develop any worsening symptoms such as increasing abdominal pain, fever, or vomiting.

## 2019-08-10 NOTE — ED Provider Notes (Signed)
[facility] EMERGENCY DEPARTMENT Provider Note   CSN: [id number] Arrival date & time: 08/10/19  1031     History Chief Complaint  Patient presents with  . Abdominal Pain    Beverly Atkinson is a 39 y.o. female.  HPI      Beverly Atkinson is a 38 y.o. female with hx of PCOS and endometriosis who presents to the Emergency Department complaining of lower abdominal pain x 2 weeks.  Describes the pain as constant.  She also reports having constipation for several weeks as well.  She has been taking laxatives/stool softeners with minimal relief.  She is concerned that her endometriosis has worsened.  She denies fever, chills, nausea, vomiting dysuria or abnormal vaginal bleeding.     Past Medical History:  Diagnosis Date  . Insomnia   . Restless leg   . Thyroid disease     Patient Active Problem List   Diagnosis Date Noted  . Hyperlipidemia 07/15/2018  . Pain management contract signed 09/27/2017  . Opiate dependence (HCC) 09/27/2017  . Polycystic ovaries 06/18/2017  . History of endometriosis 06/18/2017  . Hypothyroidism due to Hashimoto's thyroiditis 06/18/2017  . Essential hypertension 06/13/2017  . Hashimoto's thyroiditis 03/18/2017  . Primary hypothyroidism 03/18/2017  . Left bundle branch block 03/18/2017  . Chronic low back pain 03/18/2017  . Morbid obesity (HCC) 03/18/2017  . Major depressive disorder with single episode, in full remission (HCC) 05/25/2016  . Abnormal uterine bleeding (AUB) 08/09/2015  . Chronic pelvic pain in female 08/09/2015  . Epigastric pain 01/26/2014  . GS (gallstone) 01/26/2014  . Endometriosis 09/24/2013  . Insomnia 09/24/2013  . Restless leg syndrome 09/24/2013  . Depression 09/24/2013  . Heart murmur 09/24/2013    Past Surgical History:  Procedure Laterality Date  . CHOLECYSTECTOMY     2015  . ENDOMETRIAL ABLATION  08/23/2015  . EYE SURGERY     Lazy eye at age 41     OB History   No obstetric history on file.      Family History  Problem Relation Age of Onset  . Drug abuse Mother   . Mental illness Mother   . Cancer Father     Social History   Tobacco Use  . Smoking status: Never Smoker  . Smokeless tobacco: Never Used  Vaping Use  . Vaping Use: Never used  Substance Use Topics  . Alcohol use: Yes    Alcohol/week: 1.0 standard drink    Types: 1 Shots of liquor per week  . Drug use: No    Home Medications Prior to Admission medications   Medication Sig Start Date End Date Taking? Authorizing Provider  B Complex Vitamins (VITAMIN B COMPLEX PO) Take by mouth.    [provider]  Calcium Carbonate-Vitamin D 300-100 MG-UNIT CAPS Take by mouth.    [provider]  cyclobenzaprine (FLEXERIL) 10 MG tablet TAKE 1 TABLET BY MOUTH 3  TIMES DAILY AS NEEDED FOR  MUSCLE SPASM(S) 05/07/19   Bennie Pierini, FNP  levothyroxine (SYNTHROID) 50 MCG tablet Take 1 tablet (50 mcg total) by mouth daily. 04/23/19   Junie Spencer, FNP  Multiple Vitamins-Minerals (MULTIVITAMIN PO) Take by mouth.    [provider]  ondansetron (ZOFRAN ODT) 4 MG disintegrating tablet Take 1 tablet (4 mg total) by mouth every 8 (eight) hours as needed for nausea or vomiting. 04/23/19   Junie Spencer, FNP  OVER THE COUNTER MEDICATION Thyroid complex daily    [provider]  sertraline (ZOLOFT) 100  MG tablet TAKE 1 TABLET BY MOUTH  DAILY 06/23/19   Jannifer Rodney A, FNP  temazepam (RESTORIL) 30 MG capsule Take 1 capsule (30 mg total) by mouth at bedtime as needed for sleep. 04/23/19   Junie Spencer, FNP  traMADol (ULTRAM) 50 MG tablet Take 1 tablet (50 mg total) by mouth every 12 (twelve) hours as needed. 04/23/19   Junie Spencer, FNP    Allergies    Clarithromycin  Review of Systems   Review of Systems  Constitutional: Negative for appetite change, chills and fever.  Respiratory: Negative for chest tightness and shortness of breath.   Cardiovascular: Negative for chest pain.   Gastrointestinal: Positive for abdominal pain and constipation. Negative for blood in stool, nausea and vomiting.  Genitourinary: Negative for decreased urine volume, difficulty urinating, dysuria and flank pain.  Musculoskeletal: Negative for back pain.  Skin: Negative for color change and rash.  Neurological: Negative for dizziness, weakness and numbness.  Hematological: Negative for adenopathy.    Physical Exam Updated Vital Signs BP 132/84   Pulse (!) 55   Temp 98.8 F (37.1 C)   Resp 20   Ht 5' (1.524 m)   Wt 86.2 kg   SpO2 100%   BMI 37.11 kg/m   Physical Exam Vitals and nursing note reviewed.  Constitutional:      Appearance: She is well-developed. She is not ill-appearing or toxic-appearing.  HENT:     Head: Atraumatic.  Cardiovascular:     Rate and Rhythm: Normal rate and regular rhythm.     Heart sounds: No murmur heard.   Pulmonary:     Effort: Pulmonary effort is normal.     Breath sounds: Normal breath sounds.  Chest:     Chest wall: No tenderness.  Abdominal:     Palpations: Abdomen is soft.     Tenderness: There is abdominal tenderness (diffuse ttp of the entire lower abdomen.  no guardign or rebound tenderness) in the right lower quadrant, suprapubic area and left lower quadrant. There is no right CVA tenderness, left CVA tenderness or guarding. Negative signs include McBurney's sign.  Skin:    General: Skin is warm.     Capillary Refill: Capillary refill takes less than 2 seconds.  Neurological:     General: No focal deficit present.     Mental Status: She is alert.     ED Results / Procedures / Treatments   Labs (all labs ordered are listed, but only abnormal results are displayed) Labs Reviewed  COMPREHENSIVE METABOLIC PANEL - Abnormal; Notable for the following components:      Result Value   Glucose, Bld 101 (*)    All other components within normal limits  URINALYSIS, ROUTINE W REFLEX MICROSCOPIC - Abnormal; Notable for the following  components:   Color, Urine STRAW (*)    Specific Gravity, Urine 1.004 (*)    Hgb urine dipstick LARGE (*)    Bacteria, UA RARE (*)    All other components within normal limits  CBC WITH DIFFERENTIAL/PLATELET  PREGNANCY, URINE  LIPASE, BLOOD    EKG None  Radiology CT ABDOMEN PELVIS W CONTRAST  Result Date: 08/10/2019 CLINICAL DATA:  PT c/o Low abdominal pain for 2 weeks, she says she has endometriosis and the pain is chronic, h/o endometrial ablation, cholecystectomy.^170mL OMNIPAQUE IOHEXOL 300 MG/ML SOLN EXAM: CT ABDOMEN AND PELVIS WITH CONTRAST TECHNIQUE: Multidetector CT imaging of the abdomen and pelvis was performed using the standard protocol following bolus administration of intravenous contrast.  CONTRAST:  OMNIPAQUE IOHEXOL 300 MG/ML  SOLN COMPARISON:  None. FINDINGS: Lower chest: No acute abnormality. Hepatobiliary: No focal liver abnormality is seen. Status post cholecystectomy. No biliary dilatation. Pancreas: Unremarkable. No pancreatic ductal dilatation or surrounding inflammatory changes. Spleen: Normal in size without focal abnormality. Adrenals/Urinary Tract: Adrenal glands are unremarkable. Kidneys are normal, without renal calculi, focal lesion, or hydronephrosis. Bladder is unremarkable. Stomach/Bowel: Stomach is within normal limits. The appendix is not well visualized and may be surgically absent. No evidence of bowel wall thickening, distention, or inflammatory changes. There is stool throughout the colon. Vascular/Lymphatic: No significant vascular findings are present. No enlarged abdominal or pelvic lymph nodes. Reproductive: Uterus and bilateral adnexa are unremarkable. Other: No abdominal wall hernia or abnormality. No abdominopelvic ascites. Musculoskeletal: No acute or significant osseous findings. IMPRESSION: No acute findings in the abdomen or pelvis. Electronically Signed   By: Emmaline Kluver M.D.   On: 08/10/2019 15:21    Procedures Procedures (including  critical care time)  Medications Ordered in ED Medications  iohexol (OMNIPAQUE) 300 MG/ML solution 100 mL (100 mLs Intravenous Contrast Given 08/10/19 1437)    ED Course  I have reviewed the triage vital signs and the nursing notes.  Pertinent labs & imaging results that were available during my care of the patient were reviewed by me and considered in my medical decision making (see chart for details).    MDM Rules/Calculators/A&P                          Patient here with lower abdominal pain for 2 weeks.  Hx of PCOS and endometriosis.  Also reports constipation for 2 weeks.  Had a BM this morning.    She is well-appearing.  Nontoxic.  Vital signs reviewed.  Abdomen is soft with mild tenderness of lower abdomen on exam.  No guarding or rebound tenderness.  Doubt acute abdominal process.  Labs unremarkable, CT scan of the abdomen and pelvis also reassuring without acute findings.  There is significant stool throughout the colon.  Symptoms are felt to be related to constipation.  Discussed outpatient treatment with laxative, recommend MiraLAX and increase oral fluids.  She appears appropriate for discharge home, agrees to plan.  She has an upcoming appointment next month with OB/GYN in [place].  Return precautions discussed.  Final Clinical Impression(s) / ED Diagnoses Final diagnoses:  Constipation, unspecified constipation type    Rx / DC Orders ED Discharge Orders    None       Pauline Aus, PA-C 08/11/19 2254    Bethann Berkshire, MD 08/12/19 [phone]

## 2019-08-21 ENCOUNTER — Encounter: Payer: Self-pay | Admitting: Family

## 2019-08-21 ENCOUNTER — Ambulatory Visit: Payer: 59 | Admitting: Family

## 2019-08-21 ENCOUNTER — Other Ambulatory Visit: Payer: Self-pay

## 2019-08-21 VITALS — BP 122/87 | HR 90 | Temp 98.3°F | Ht 60.0 in | Wt 195.8 lb

## 2019-08-21 DIAGNOSIS — F1129 Opioid dependence with unspecified opioid-induced disorder: Secondary | ICD-10-CM

## 2019-08-21 DIAGNOSIS — E038 Other specified hypothyroidism: Secondary | ICD-10-CM | POA: Diagnosis not present

## 2019-08-21 DIAGNOSIS — I10 Essential (primary) hypertension: Secondary | ICD-10-CM

## 2019-08-21 DIAGNOSIS — E785 Hyperlipidemia, unspecified: Secondary | ICD-10-CM

## 2019-08-21 DIAGNOSIS — M545 Low back pain, unspecified: Secondary | ICD-10-CM

## 2019-08-21 DIAGNOSIS — K5900 Constipation, unspecified: Secondary | ICD-10-CM

## 2019-08-21 DIAGNOSIS — E039 Hypothyroidism, unspecified: Secondary | ICD-10-CM

## 2019-08-21 DIAGNOSIS — E063 Autoimmune thyroiditis: Secondary | ICD-10-CM

## 2019-08-21 DIAGNOSIS — G2581 Restless legs syndrome: Secondary | ICD-10-CM

## 2019-08-21 DIAGNOSIS — Z1159 Encounter for screening for other viral diseases: Secondary | ICD-10-CM

## 2019-08-21 DIAGNOSIS — G4700 Insomnia, unspecified: Secondary | ICD-10-CM

## 2019-08-21 DIAGNOSIS — G8929 Other chronic pain: Secondary | ICD-10-CM

## 2019-08-21 DIAGNOSIS — Z0289 Encounter for other administrative examinations: Secondary | ICD-10-CM

## 2019-08-21 DIAGNOSIS — F330 Major depressive disorder, recurrent, mild: Secondary | ICD-10-CM

## 2019-08-21 MED ORDER — TEMAZEPAM 30 MG PO CAPS: 30.0000 mg | ORAL_CAPSULE | Freq: Every evening | ORAL | 1 refills | Status: DC | PRN

## 2019-08-21 MED ORDER — LINACLOTIDE 145 MCG PO CAPS: 145.0000 ug | ORAL_CAPSULE | Freq: Every day | ORAL | 6 refills | Status: DC

## 2019-08-21 MED ORDER — TRAMADOL HCL 50 MG PO TABS: 50.0000 mg | ORAL_TABLET | Freq: Two times a day (BID) | ORAL | 2 refills | Status: DC | PRN

## 2019-08-21 NOTE — Progress Notes (Signed)
Subjective:    Patient ID: Beverly Atkinson, female    DOB: [birth year], 39 y.o.   MRN: [id number]  Chief Complaint  Patient presents with  . Medical Management of Chronic Issues   Pt presents to the office today for chronic follow up and pain medication refill.She has Endometriosis and is followed by GYN.  Thyroid Problem Presents for follow-up visit. Symptoms include constipation and depressed mood. Patient reports no fatigue or hoarse voice. The symptoms have been stable. Her past medical history is significant for hyperlipidemia.  Depression        This is a chronic problem.  The current episode started more than 1 year ago.   The onset quality is gradual.   The problem occurs intermittently.  Associated symptoms include insomnia, decreased interest and sad.  Associated symptoms include no fatigue, no helplessness, no hopelessness, not irritable and no restlessness.  Past treatments include SSRIs - Selective serotonin reuptake inhibitors.  Compliance with treatment is good.  Past medical history includes thyroid problem.   Hyperlipidemia This is a chronic problem. The current episode started more than 1 year ago. The problem is uncontrolled. Exacerbating diseases include obesity. Current antihyperlipidemic treatment includes statins. The current treatment provides moderate improvement of lipids. Risk factors for coronary artery disease include dyslipidemia, female sex, hypertension and a sedentary lifestyle.  Back Pain This is a chronic problem. The current episode started more than 1 year ago. The problem occurs intermittently. The problem has been waxing and waning since onset. The pain is present in the lumbar spine. The quality of the pain is described as aching. The pain is at a severity of 3/10. The pain is mild. Risk factors include obesity. She has tried analgesics for the symptoms. The treatment provided mild relief.  Insomnia Primary symptoms: difficulty falling asleep, frequent  awakening.  The current episode started more than one year. The onset quality is gradual. PMH includes: depression.      Review of Systems  Constitutional: Negative for fatigue.  HENT: Negative for hoarse voice.   Gastrointestinal: Positive for constipation.  Musculoskeletal: Positive for back pain.  Psychiatric/Behavioral: Positive for depression. The patient has insomnia.   All other systems reviewed and are negative.      Objective:   Physical Exam Vitals reviewed.  Constitutional:      General: She is not irritable.She is not in acute distress.    Appearance: She is well-developed. She is obese.  HENT:     Head: Normocephalic and atraumatic.     Right Ear: Tympanic membrane normal.     Left Ear: Tympanic membrane normal.  Eyes:     Pupils: Pupils are equal, round, and reactive to light.  Neck:     Thyroid: No thyromegaly.  Cardiovascular:     Rate and Rhythm: Normal rate and regular rhythm.     Heart sounds: Normal heart sounds. No murmur heard.   Pulmonary:     Effort: Pulmonary effort is normal. No respiratory distress.     Breath sounds: Normal breath sounds. No wheezing.  Abdominal:     General: Bowel sounds are normal. There is no distension.     Palpations: Abdomen is soft.     Tenderness: There is no abdominal tenderness.     Comments: Hypoactive BS  Musculoskeletal:        General: No tenderness. Normal range of motion.     Cervical back: Normal range of motion and neck supple.  Skin:    General: Skin is warm  and dry.  Neurological:     Mental Status: She is alert and oriented to person, place, and time.     Cranial Nerves: No cranial nerve deficit.     Deep Tendon Reflexes: Reflexes are normal and symmetric.  Psychiatric:        Behavior: Behavior normal.        Thought Content: Thought content normal.        Judgment: Judgment normal.       BP (!) 122/87   Pulse 90   Temp 98.3 F (36.8 C) (Temporal)   Ht 5' (1.524 m)   Wt 195 lb 12.8 oz  (88.8 kg)   BMI 38.24 kg/m      Assessment & Plan:  Shakinah Navis comes in today with chief complaint of Medical Management of Chronic Issues   Diagnosis and orders addressed:  1. Essential hypertension  2. Hypothyroidism due to Hashimoto's thyroiditis - TSH  3. Primary hypothyroidism  4. Chronic bilateral low back pain without sciati - traMADol (ULTRAM) 50 MG tablet; Take 1 tablet (50 mg total) by mouth every 12 (twelve) hours as needed.  Dispense: 30 tablet; Refill: 2  5. Mild episode of recurrent major depressive disorder (HCC)  6. Hyperlipidemia, unspecified hyperlipidemia type - Lipid panel  7. Morbid obesity (HCC)  8. Opioid dependence with opioid-induced disorder (HCC)  - ToxASSURE Select 13 (MW), Urine - traMADol (ULTRAM) 50 MG tablet; Take 1 tablet (50 mg total) by mouth every 12 (twelve) hours as needed.  Dispense: 30 tablet; Refill: 2  9. Pain management contract signed - ToxASSURE Select 13 (MW), Urine - traMADol (ULTRAM) 50 MG tablet; Take 1 tablet (50 mg total) by mouth every 12 (twelve) hours as needed.  Dispense: 30 tablet; Refill: 2  10. Need for hepatitis C screening test - Hepatitis C antibody  11. Constipation, unspecified constipation type - linaclotide (LINZESS) 145 MCG CAPS capsule; Take 1 capsule (145 mcg total) by mouth daily before breakfast.  Dispense: 30 capsule; Refill: 6  12. Insomnia, unspecified type - temazepam (RESTORIL) 30 MG capsule; Take 1 capsule (30 mg total) by mouth at bedtime as needed for sleep.  Dispense: 90 capsule; Refill: 1  13. Restless leg syndrome - temazepam (RESTORIL) 30 MG capsule; Take 1 capsule (30 mg total) by mouth at bedtime as needed for sleep.  Dispense: 90 capsule; Refill: 1   Labs pending Patient reviewed in [place] controlled database, no flags noted. Contract and drug screen up dated today Health Maintenance reviewed Diet and exercise encouraged  Follow up plan: 3 months    Jannifer Rodney, FNP

## 2019-08-21 NOTE — Patient Instructions (Signed)

## 2019-08-22 LAB — LIPID PANEL
Chol/HDL Ratio: 5 ratio — ABNORMAL HIGH (ref 0.0–4.4)
Cholesterol, Total: 238 mg/dL — ABNORMAL HIGH (ref 100–199)
HDL: 48 mg/dL (ref >39)
LDL Chol Calc (NIH): 151 mg/dL — ABNORMAL HIGH (ref 0–99)
Triglycerides: 213 mg/dL — ABNORMAL HIGH (ref 0–149)
VLDL Cholesterol Cal: 39 mg/dL (ref 5–40)

## 2019-08-22 LAB — HEPATITIS C ANTIBODY: Hep C Virus Ab: <0.1 s/co ratio (ref 0.0–0.9)

## 2019-08-22 LAB — TSH: TSH: 23.4 u[IU]/mL — ABNORMAL HIGH (ref 0.450–4.500)

## 2019-08-24 ENCOUNTER — Other Ambulatory Visit: Payer: Self-pay | Admitting: Family

## 2019-08-24 MED ORDER — LEVOTHYROXINE SODIUM 88 MCG PO TABS
88.0000 ug | ORAL_TABLET | Freq: Every day | ORAL | 11 refills | Status: DC
Start: 2019-08-24 — End: 2019-10-14

## 2019-08-25 LAB — TOXASSURE SELECT 13 (MW), URINE

## 2019-09-08 ENCOUNTER — Other Ambulatory Visit: Payer: Self-pay

## 2019-09-08 ENCOUNTER — Encounter (INDEPENDENT_AMBULATORY_CARE_PROVIDER_SITE_OTHER): Payer: Self-pay | Admitting: Internal Medicine

## 2019-09-08 ENCOUNTER — Ambulatory Visit (INDEPENDENT_AMBULATORY_CARE_PROVIDER_SITE_OTHER): Payer: 59 | Admitting: Internal Medicine

## 2019-09-08 VITALS — BP 144/88 | HR 89 | Temp 97.5°F | Resp 18 | Ht 59.0 in | Wt 196.2 lb

## 2019-09-08 DIAGNOSIS — R232 Flushing: Secondary | ICD-10-CM

## 2019-09-08 DIAGNOSIS — E282 Polycystic ovarian syndrome: Secondary | ICD-10-CM

## 2019-09-08 DIAGNOSIS — E039 Hypothyroidism, unspecified: Secondary | ICD-10-CM

## 2019-09-08 DIAGNOSIS — R61 Generalized hyperhidrosis: Secondary | ICD-10-CM

## 2019-09-08 DIAGNOSIS — Z23 Encounter for immunization: Secondary | ICD-10-CM

## 2019-09-08 MED ORDER — THYROID 60 MG PO TABS
60.0000 mg | ORAL_TABLET | Freq: Every day | ORAL | 3 refills | Status: DC
Start: 2019-09-08 — End: 2019-10-14

## 2019-09-08 MED ORDER — PROGESTERONE 200 MG PO CAPS: 200.0000 mg | ORAL_CAPSULE | Freq: Every day | ORAL | 3 refills | Status: DC

## 2019-09-08 NOTE — Progress Notes (Signed)
Metrics: Intervention Frequency ACO  Documented Smoking Status Yearly  Screened one or more times in 24 months  Cessation Counseling or  Active cessation medication Past 24 months  Past 24 months   Guideline developer: UpToDate (See UpToDate for funding source) Date Released: 2014       Wellness Office Visit  Subjective:  Patient ID: Beverly Atkinson, female    DOB: 01-Mar-1980  Age: 39 y.o. MRN: [id number]  CC: This 39 year old lady comes to our practice to establish care. HPI  She has a history of hypothyroidism and takes levothyroxine.  She does not feel energized, has hair loss, constipation and brain fog.  Her menstrual cycles are irregular. She also describes a previous history of elevated testosterone levels and on further questioning describes hirsutism and degree of acne. She also describes night sweats and hot flashes since a very young age in her 35s. She has insomnia and takes temazepam every night. She also takes tramadol when she needs it for her endometriosis pain.  She has had surgery for this laparoscopically previously. Past Medical History:  Diagnosis Date  . Insomnia   . Restless leg   . Thyroid disease    Past Surgical History:  Procedure Laterality Date  . CHOLECYSTECTOMY     2015  . EYE SURGERY     Lazy eye at age 24  . LAPAROSCOPIC ENDOMETRIOSIS FULGURATION  08/23/2015     Family History  Problem Relation Age of Onset  . Drug abuse Mother   . Mental illness Mother   . Thyroid disease Mother   . Cancer Father   . 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   Social History   Tobacco Use  . Smoking status: Never Smoker  . Smokeless tobacco: Never Used  Substance Use Topics  . Alcohol use: Yes    Comment: occ    Current Meds  Medication Sig  . B Complex Vitamins (VITAMIN B COMPLEX PO) Take by mouth.  . Calcium Carbonate-Vitamin D 300-100 MG-UNIT  CAPS Take by mouth.  . cyclobenzaprine (FLEXERIL) 10 MG tablet TAKE 1 TABLET BY MOUTH 3  TIMES DAILY AS NEEDED FOR  MUSCLE SPASM(S)  . Levonorgestrel-Ethinyl Estradiol (JAIMIESS) 0.15-0.03 &0.01 MG tablet Take 1 tablet by mouth daily.  Marland Kitchen levothyroxine (SYNTHROID) 88 MCG tablet Take 1 tablet (88 mcg total) by mouth daily.  Marland Kitchen linaclotide (LINZESS) 145 MCG CAPS capsule Take 1 capsule (145 mcg total) by mouth daily before breakfast.  . Multiple Vitamins-Minerals (MULTIVITAMIN PO) Take by mouth.  . ondansetron (ZOFRAN ODT) 4 MG disintegrating tablet Take 1 tablet (4 mg total) by mouth every 8 (eight) hours as needed for nausea or vomiting.  Marland Kitchen OVER THE COUNTER MEDICATION Thyroid complex daily  . OVER THE COUNTER MEDICATION Take 1 capsule by mouth daily as needed. [employer] Thyroid complex.  . sertraline (ZOLOFT) 100 MG tablet TAKE 1 TABLET BY MOUTH  DAILY  . temazepam (RESTORIL) 30 MG capsule Take 1 capsule (30 mg total) by mouth at bedtime as needed for sleep.  . traMADol (ULTRAM) 50 MG tablet Take 1 tablet (50 mg total) by mouth every 12 (twelve) hours as needed.  . Turmeric Curcumin 500 MG CAPS Take 1 capsule by mouth daily.       Depression screen [facility] 2/9 08/21/2019 08/21/2019 01/30/2019 10/24/2018 10/24/2018  Decreased Interest 2 1 0 0 0  Down, Depressed, Hopeless 1 0 0  0 0  PHQ - 2 Score 3 1 0 0 0  Altered sleeping 0 - - 0 -  Tired, decreased energy 3 - - 0 -  Change in appetite 2 - - 0 -  Feeling bad or failure about yourself  0 - - 0 -  Trouble concentrating 1 - - 0 -  Moving slowly or fidgety/restless 0 - - 0 -  Suicidal thoughts 0 - - 0 -  PHQ-9 Score 9 - - 0 -  Difficult doing work/chores Not difficult at all - - Not difficult at all -     Objective:   Today's Vitals: BP (!) 144/88 (BP Location: Right Arm, Patient Position: Sitting, Cuff Size: Normal)   Pulse 89   Temp (!) 97.5 F (36.4 C) (Temporal)   Resp 18   Ht 4\' 11"  (1.499 m)   Wt 196 lb 3.2 oz (89 kg)   LMP  09/01/2019 (Exact Date)   SpO2 98%   BMI 39.63 kg/m  Vitals with BMI 09/08/2019 08/21/2019 08/10/2019  Height 4\' 11"  5\' 0"  -  Weight 196 lbs 3 oz 195 lbs 13 oz -  BMI 39.61 38.24 -  Systolic 144 122 08/12/2019  Diastolic 88 87 89  Pulse 89 90 89     Physical Exam  She is almost morbidly obese.  Blood pressure elevated today.  Previous readings have been more reasonable.     Assessment   1. Primary hypothyroidism   2. Encounter for immunization   3. Hot flashes   4. Night sweats   5. PCOS (polycystic ovarian syndrome)       Tests ordered Orders Placed This Encounter  Procedures  . Moderna SARS-CoV-2 Vaccine  . T3, free  . Testos,Total,Free and SHBG (Female)  . Luteinizing hormone  . Follicle stimulating hormone  . Progesterone     Plan: 1. As far as her hypothyroidism is concerned, after discussion, we agreed to discontinue levothyroxine and start her on desiccated NP thyroid to get the effects of T3 which is more important. 2. I am going to start her on progesterone at night to see if this will help her insomnia, night sweats and hot flashes. 3. I will check her blood work for PCOS.  We will also check a T3 level as a baseline today. 4. Follow-up in the next few weeks to discuss how she is doing and further recommendations.   Meds ordered this encounter  Medications  . thyroid (NP THYROID) 60 MG tablet    Sig: Take 1 tablet (60 mg total) by mouth daily before breakfast.    Dispense:  30 tablet    Refill:  3  . progesterone (PROMETRIUM) 200 MG capsule    Sig: Take 1 capsule (200 mg total) by mouth daily.    Dispense:  30 capsule    Refill:  3    Malcomb Gangemi , MD

## 2019-09-12 LAB — TESTOS,TOTAL,FREE AND SHBG (FEMALE)
Free Testosterone: 1.5 pg/mL (ref 0.1–6.4)
Sex Hormone Binding: 212 nmol/L — ABNORMAL HIGH (ref 17–124)
Testosterone, Total, LC-MS-MS: 41 ng/dL (ref 2–45)

## 2019-09-12 LAB — FOLLICLE STIMULATING HORMONE: FSH: 5.2 m[IU]/mL

## 2019-09-12 LAB — PROGESTERONE: Progesterone: <0.5 ng/mL

## 2019-09-12 LAB — T3, FREE: T3, Free: 2.2 pg/mL — ABNORMAL LOW (ref 2.3–4.2)

## 2019-09-12 LAB — LUTEINIZING HORMONE: LH: 12.4 m[IU]/mL

## 2019-10-14 ENCOUNTER — Other Ambulatory Visit: Payer: Self-pay

## 2019-10-14 ENCOUNTER — Encounter (INDEPENDENT_AMBULATORY_CARE_PROVIDER_SITE_OTHER): Payer: Self-pay | Admitting: Internal Medicine

## 2019-10-14 ENCOUNTER — Ambulatory Visit (INDEPENDENT_AMBULATORY_CARE_PROVIDER_SITE_OTHER): Payer: 59 | Admitting: Internal Medicine

## 2019-10-14 VITALS — BP 130/80 | HR 80 | Ht 60.0 in | Wt 194.6 lb

## 2019-10-14 DIAGNOSIS — E559 Vitamin D deficiency, unspecified: Secondary | ICD-10-CM | POA: Diagnosis not present

## 2019-10-14 DIAGNOSIS — E669 Obesity, unspecified: Secondary | ICD-10-CM

## 2019-10-14 DIAGNOSIS — E282 Polycystic ovarian syndrome: Secondary | ICD-10-CM

## 2019-10-14 DIAGNOSIS — E039 Hypothyroidism, unspecified: Secondary | ICD-10-CM | POA: Diagnosis not present

## 2019-10-14 NOTE — Progress Notes (Signed)
Metrics: Intervention Frequency ACO  Documented Smoking Status Yearly  Screened one or more times in 24 months  Cessation Counseling or  Active cessation medication Past 24 months  Past 24 months   Guideline developer: UpToDate (See UpToDate for funding source) Date Released: 2014       Wellness Office Visit  Subjective:  Patient ID: [facility] Atkinson, female    DOB: [birth year]  Age: 39 y.o. MRN: [id number]  CC: This lady comes in for follow-up regarding her hypothyroidism and review of her blood results. HPI  I reviewed all her blood results with her and she clearly has a FSH/LH ratio which is flipped from 2:1 to 1:2. This is pathopneumonic for diagnosis of PCOS.  She clearly has symptoms regarding the diagnosis also. She does feel better with desiccated NP thyroid and her mental fog is clearing and energy levels are improving. Past Medical History:  Diagnosis Date  . Insomnia   . Restless leg   . Thyroid disease    Past Surgical History:  Procedure Laterality Date  . CHOLECYSTECTOMY     2015  . EYE SURGERY     Lazy eye at age 65  . LAPAROSCOPIC ENDOMETRIOSIS FULGURATION  08/23/2015     Family History  Problem Relation Age of Onset  . Drug abuse Mother   . Mental illness Mother   . Thyroid disease Mother   . Cancer Father   . 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   Social History   Tobacco Use  . Smoking status: Never Smoker  . Smokeless tobacco: Never Used  Substance Use Topics  . Alcohol use: Yes    Comment: occ    Current Meds  Medication Sig  . B Complex Vitamins (VITAMIN B COMPLEX PO) Take by mouth.  . Calcium Carbonate-Vitamin D 300-100 MG-UNIT CAPS Take by mouth.  . cyclobenzaprine (FLEXERIL) 10 MG tablet TAKE 1 TABLET BY MOUTH 3  TIMES DAILY AS NEEDED FOR  MUSCLE SPASM(S)  . Levonorgestrel-Ethinyl Estradiol (JAIMIESS) 0.15-0.03 &0.01 MG tablet Take 1  tablet by mouth daily.  Marland Kitchen linaclotide (LINZESS) 145 MCG CAPS capsule Take 1 capsule (145 mcg total) by mouth daily before breakfast.  . Multiple Vitamins-Minerals (MULTIVITAMIN PO) Take by mouth.  . NP THYROID 60 MG tablet Take 60 mg by mouth daily before breakfast.  . ondansetron (ZOFRAN ODT) 4 MG disintegrating tablet Take 1 tablet (4 mg total) by mouth every 8 (eight) hours as needed for nausea or vomiting.  Marland Kitchen OVER THE COUNTER MEDICATION Thyroid complex daily  . progesterone (PROMETRIUM) 200 MG capsule Take 1 capsule (200 mg total) by mouth daily.  . sertraline (ZOLOFT) 100 MG tablet TAKE 1 TABLET BY MOUTH  DAILY  . temazepam (RESTORIL) 30 MG capsule Take 1 capsule (30 mg total) by mouth at bedtime as needed for sleep.  . traMADol (ULTRAM) 50 MG tablet Take 1 tablet (50 mg total) by mouth every 12 (twelve) hours as needed.  . Turmeric Curcumin 500 MG CAPS Take 1 capsule by mouth daily.      Depression screen [facility] 2/9 08/21/2019 08/21/2019 01/30/2019 10/24/2018 10/24/2018  Decreased Interest 2 1 0 0 0  Down, Depressed, Hopeless 1 0 0 0 0  PHQ - 2 Score 3 1 0 0 0  Altered sleeping 0 - - 0 -  Tired, decreased energy 3 - - 0 -  Change in appetite 2 - -  0 -  Feeling bad or failure about yourself  0 - - 0 -  Trouble concentrating 1 - - 0 -  Moving slowly or fidgety/restless 0 - - 0 -  Suicidal thoughts 0 - - 0 -  PHQ-9 Score 9 - - 0 -  Difficult doing work/chores Not difficult at all - - Not difficult at all -     Objective:   Today's Vitals: BP 130/80   Pulse 80   Ht 5' (1.524 m)   Wt 194 lb 9.6 oz (88.3 kg)   BMI 38.01 kg/m  Vitals with BMI 10/14/2019 09/08/2019 08/21/2019  Height 5\' 0"  4\' 11"  5\' 0"   Weight 194 lbs 10 oz 196 lbs 3 oz 195 lbs 13 oz  BMI 38.01 39.61 38.24  Systolic 130 144  Diastolic 80 88 87  Pulse 80 89 90     Physical Exam  She looks systemically well.  She remains obese.  She has lost a couple of pounds since last time she was seen.  Blood pressure is  normal.     Assessment   1. PCOS (polycystic ovarian syndrome)   2. Primary hypothyroidism   3. Obesity (BMI 30-39.9)   4. Vitamin D deficiency disease       Tests ordered Orders Placed This Encounter  Procedures  . T3, free  . TSH  . VITAMIN D 25 Hydroxy (Vit-D Deficiency, Fractures)     Plan: 1. We will check T3 levels for her hypothyroidism and we may need to adjust upwards the dose of NP thyroid. 2. I discussed PCOS in detail today regarding its pathophysiology and treatment.  We discussed nutrition and the concept of intermittent fasting combined with a plant-based diet incorporating nuts and beans on a daily basis.  I discussed the blue zones today.  I discussed the importance of hydration.  She is on birth control pill which will help protect her breast and uterus the time being.  I recommended that she continue taking progesterone as this seems to be helping her to some degree with symptoms. 3. Further recommendations will depend on blood results and I will see her in about a month's time for follow-up.   No orders of the defined types were placed in this encounter.   , MD

## 2019-10-14 NOTE — Patient Instructions (Signed)
[facility] Dietary Recommendations for Weight Loss What to Avoid . Avoid added sugars o Often added sugar can be found in processed foods such as many condiments, dry cereals, cakes, cookies, chips, crisps, crackers, candies, sweetened drinks, etc.  o Read labels and AVOID/DECREASE use of foods with the following in their ingredient list: Sugar, fructose, high fructose corn syrup, sucrose, glucose, maltose, dextrose, molasses, cane sugar, brown sugar, any type of syrup, agave nectar, etc.   . Avoid snacking in between meals . Avoid foods made with flour o If you are going to eat food made with flour, choose those made with whole-grains; and, minimize your consumption as much as is tolerable . Avoid processed foods o These foods are generally stocked in the middle of the grocery store. Focus on shopping on the perimeter of the grocery.  . Avoid Meat  o We recommend following a plant-based diet at [facility]. Thus, we recommend avoiding meat as a general rule. Consider eating beans, legumes, eggs, and/or dairy products for regular protein sources o If you plan on eating meat limit to 4 ounces of meat at a time and choose lean options such as Fish, chicken, turkey. Avoid red meat intake such as pork and/or steak What to Include . Vegetables o GREEN LEAFY VEGETABLES: Kale, spinach, mustard greens, collard greens, cabbage, broccoli, etc. o OTHER: Asparagus, cauliflower, eggplant, carrots, peas, Brussel sprouts, tomatoes, bell peppers, zucchini, beets, cucumbers, etc. . Grains, seeds, and legumes o Beans: kidney beans, black eyed peas, garbanzo beans, black beans, pinto beans, etc. o Whole, unrefined grains: brown rice, barley, bulgur, oatmeal, etc. . Healthy fats  o Avoid highly processed fats such as vegetable oil o Examples of healthy fats: avocado, olives, virgin olive oil, dark chocolate (?72% Cocoa), nuts (peanuts, almonds, walnuts, cashews, pecans, etc.) . None to Low  Intake of Animal Sources of Protein o Meat sources: chicken, turkey, salmon, tuna. Limit to 4 ounces of meat at one time. o Consider limiting dairy sources, but when choosing dairy focus on: PLAIN Greek yogurt, cottage cheese, high-protein milk . Fruit o Choose berries  When to Eat . Intermittent Fasting: o Choosing not to eat for a specific time period, but DO FOCUS ON HYDRATION when fasting o Multiple Techniques: - Time Restricted Eating: eat 3 meals in a day, each meal lasting no more than 60 minutes, no snacks between meals - 16-18 hour fast: fast for 16 to 18 hours up to 7 days a week. Often suggested to start with 2-3 nonconsecutive days per week.  . Remember the time you sleep is counted as fasting.  . Examples of eating schedule: Fast from 7:00pm-11:00am. Eat between 11:00am-7:00pm.  - 24-hour fast: fast for 24 hours up to every other day. Often suggested to start with 1 day per week . Remember the time you sleep is counted as fasting . Examples of eating schedule:  o Eating day: eat 2-3 meals on your eating day. If doing 2 meals, each meal should last no more than 90 minutes. If doing 3 meals, each meal should last no more than 60 minutes. Finish last meal by 7:00pm. o Fasting day: Fast until 7:00pm.  o IF YOU FEEL UNWELL FOR ANY REASON/IN ANY WAY WHEN FASTING, STOP FASTING BY EATING A NUTRITIOUS SNACK OR LIGHT MEAL o ALWAYS FOCUS ON HYDRATION DURING FASTS - Acceptable Hydration sources: water, broths, tea/coffee (black tea/coffee is best but using a small amount of whole-fat dairy products in coffee/tea is acceptable).  -   Poor Hydration Sources: anything with sugar or artificial sweeteners added to it  These recommendations have been developed for patients that are actively receiving medical care from either Dr. Samir Ishaq or Sarah Gray, DNP, NP-C at [facility]. These recommendations are developed for patients with specific medical conditions and are not meant to be  distributed or used by others that are not actively receiving care from either provider listed above at [facility]. It is not appropriate to participate in the above eating plans without proper medical supervision.   Reference: Fung, J. The obesity code. [place]: [place]; 2016.   

## 2019-10-15 ENCOUNTER — Other Ambulatory Visit (INDEPENDENT_AMBULATORY_CARE_PROVIDER_SITE_OTHER): Payer: Self-pay | Admitting: Internal Medicine

## 2019-10-15 LAB — T3, FREE: T3, Free: 2.7 pg/mL (ref 2.3–4.2)

## 2019-10-15 LAB — TSH: TSH: 13.42 mIU/L — ABNORMAL HIGH

## 2019-10-15 LAB — VITAMIN D 25 HYDROXY (VIT D DEFICIENCY, FRACTURES): Vit D, 25-Hydroxy: 25 ng/mL — ABNORMAL LOW (ref 30–100)

## 2019-10-15 MED ORDER — NP THYROID 90 MG PO TABS: 90.0000 mg | ORAL_TABLET | Freq: Every day | ORAL | 3 refills | Status: DC

## 2019-10-27 ENCOUNTER — Ambulatory Visit (INDEPENDENT_AMBULATORY_CARE_PROVIDER_SITE_OTHER): Payer: 59 | Admitting: Internal Medicine

## 2019-10-30 ENCOUNTER — Ambulatory Visit: Payer: 59 | Admitting: Family

## 2019-11-23 ENCOUNTER — Ambulatory Visit (INDEPENDENT_AMBULATORY_CARE_PROVIDER_SITE_OTHER): Payer: 59 | Admitting: Internal Medicine

## 2019-11-30 ENCOUNTER — Other Ambulatory Visit: Payer: Self-pay

## 2019-11-30 ENCOUNTER — Ambulatory Visit (INDEPENDENT_AMBULATORY_CARE_PROVIDER_SITE_OTHER): Payer: 59 | Admitting: Internal Medicine

## 2019-11-30 ENCOUNTER — Encounter (INDEPENDENT_AMBULATORY_CARE_PROVIDER_SITE_OTHER): Payer: Self-pay | Admitting: Internal Medicine

## 2019-11-30 VITALS — BP 118/74 | HR 96 | Temp 97.3°F | Ht 60.0 in | Wt 189.0 lb

## 2019-11-30 DIAGNOSIS — E282 Polycystic ovarian syndrome: Secondary | ICD-10-CM | POA: Diagnosis not present

## 2019-11-30 DIAGNOSIS — E559 Vitamin D deficiency, unspecified: Secondary | ICD-10-CM | POA: Diagnosis not present

## 2019-11-30 DIAGNOSIS — E039 Hypothyroidism, unspecified: Secondary | ICD-10-CM | POA: Diagnosis not present

## 2019-11-30 NOTE — Progress Notes (Signed)
Metrics: Intervention Frequency ACO  Documented Smoking Status Yearly  Screened one or more times in 24 months  Cessation Counseling or  Active cessation medication Past 24 months  Past 24 months   Guideline developer: UpToDate (See UpToDate for funding source) Date Released: 2014       Wellness Office Visit  Subjective:  Patient ID: Beverly Atkinson, female    DOB: [birth year]  Age: 39 y.o. MRN: [id number]  CC: This lady comes in for follow-up of hypothyroidism, PCOS, vitamin D deficiency. HPI  She is currently having trouble with vomiting which started in the last 24 hours.  What is more worrying is that she describes vomiting up stool-like material.  She has no significant abdominal pain but a low level discomfort which is somewhat chronic for her.  She has been taking Linzess as she feels she is constipated. She has found that the higher dose of NP thyroid has helped her further in terms of energy and feeling overall better.  When she feels well, she has been adhering to nutrition that we discussed.  She has managed to lose further weight. Unfortunately, she has not started taking vitamin D3 10,000 units daily as recommended. Past Medical History:  Diagnosis Date  . Insomnia   . Restless leg   . Thyroid disease    Past Surgical History:  Procedure Laterality Date  . CHOLECYSTECTOMY     2015  . 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 LAPAROSCOPIC ENDOMETRIOSIS FULGURATION  08/23/2015     Family History  Problem Relation Age of Onset  . Drug abuse Mother   . Mental illness Mother   . Thyroid disease Mother   . Cancer Father   . 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   Social History   Tobacco Use  . Smoking status: Never Smoker  . Smokeless tobacco: Never Used  Substance Use Topics  . Alcohol use: Yes    Comment: occ    Current Meds  Medication Sig  . B Complex  Vitamins (VITAMIN B COMPLEX PO) Take by mouth.  . Calcium Carbonate-Vitamin D 300-100 MG-UNIT CAPS Take by mouth.  . cyclobenzaprine (FLEXERIL) 10 MG tablet TAKE 1 TABLET BY MOUTH 3  TIMES DAILY AS NEEDED FOR  MUSCLE SPASM(S)  . Levonorgestrel-Ethinyl Estradiol (JAIMIESS) 0.15-0.03 &0.01 MG tablet Take 1 tablet by mouth daily.  Marland Kitchen linaclotide (LINZESS) 145 MCG CAPS capsule Take 1 capsule (145 mcg total) by mouth daily before breakfast.  . Multiple Vitamins-Minerals (MULTIVITAMIN PO) Take by mouth.  . NP THYROID 90 MG tablet Take 1 tablet (90 mg total) by mouth daily.  . ondansetron (ZOFRAN ODT) 4 MG disintegrating tablet Take 1 tablet (4 mg total) by mouth every 8 (eight) hours as needed for nausea or vomiting.  Marland Kitchen OVER THE COUNTER MEDICATION Thyroid complex daily  . OVER THE COUNTER MEDICATION Take 1 capsule by mouth daily as needed. The [employer] shop[employer] Thyroid complex.  . progesterone (PROMETRIUM) 200 MG capsule Take 1 capsule (200 mg total) by mouth daily.  . sertraline (ZOLOFT) 100 MG tablet TAKE 1 TABLET BY MOUTH  DAILY  . temazepam (RESTORIL) 30 MG capsule Take 1 capsule (30 mg total) by mouth at bedtime as needed for sleep.  . traMADol (ULTRAM) 50 MG tablet Take 1 tablet (50 mg total) by mouth every 12 (twelve) hours as needed.  . Turmeric Curcumin  500 MG CAPS Take 1 capsule by mouth daily.      Depression screen [facility] 2/9 08/21/2019 08/21/2019 01/30/2019 10/24/2018 10/24/2018  Decreased Interest 2 1 0 0 0  Down, Depressed, Hopeless 1 0 0 0 0  PHQ - 2 Score 3 1 0 0 0  Altered sleeping 0 - - 0 -  Tired, decreased energy 3 - - 0 -  Change in appetite 2 - - 0 -  Feeling bad or failure about yourself  0 - - 0 -  Trouble concentrating 1 - - 0 -  Moving slowly or fidgety/restless 0 - - 0 -  Suicidal thoughts 0 - - 0 -  PHQ-9 Score 9 - - 0 -  Difficult doing work/chores Not difficult at all - - Not difficult at all -     Objective:   Today's Vitals: BP 118/74   Pulse 96   Temp (!) 97.3 F  (36.3 C) (Temporal)   Ht 5' (1.524 m)   Wt 189 lb (85.7 kg)   SpO2 100%   BMI 36.91 kg/m  Vitals with BMI 11/30/2019 10/14/2019 09/08/2019  Height 5\' 0"  5\' 0"  4\' 11"   Weight 189 lbs 194 lbs 10 oz 196 lbs 3 oz  BMI 36.91 38.01 39.61  Systolic 118 130  Diastolic 74 80 88  Pulse 96 80 89     Physical Exam   She has lost 5 pounds since the last visit.  Blood pressure is excellent.  Abdomen is soft and there is no significant tenderness or rebound tenderness.  I can hear bowel sounds and they do not appear to be hyperactive in nature.    Assessment   1. Primary hypothyroidism   2. PCOS (polycystic ovarian syndrome)   3. Vitamin D deficiency disease       Tests ordered No orders of the defined types were placed in this encounter.    Plan: 1. She will continue with the same dose of NP thyroid for her hypothyroidism. 2. I strongly recommended that she start taking vitamin D3 10,000 units daily. 3. She will continue with nutrition that we discussed last visit as long as she is feeling well but I have told her that if her vomiting continues, she must go to the emergency room and she appreciates this. 4. Follow-up in 2 months.   No orders of the defined types were placed in this encounter.   , MD

## 2020-02-02 ENCOUNTER — Encounter (INDEPENDENT_AMBULATORY_CARE_PROVIDER_SITE_OTHER): Payer: Self-pay | Admitting: Internal Medicine

## 2020-02-02 ENCOUNTER — Ambulatory Visit (INDEPENDENT_AMBULATORY_CARE_PROVIDER_SITE_OTHER): Payer: 59 | Admitting: Internal Medicine

## 2020-02-02 ENCOUNTER — Other Ambulatory Visit: Payer: Self-pay

## 2020-02-02 VITALS — BP 124/78 | HR 92 | Temp 97.1°F | Ht 60.0 in | Wt 187.8 lb

## 2020-02-02 DIAGNOSIS — E282 Polycystic ovarian syndrome: Secondary | ICD-10-CM | POA: Diagnosis not present

## 2020-02-02 DIAGNOSIS — E039 Hypothyroidism, unspecified: Secondary | ICD-10-CM | POA: Diagnosis not present

## 2020-02-02 DIAGNOSIS — E559 Vitamin D deficiency, unspecified: Secondary | ICD-10-CM | POA: Diagnosis not present

## 2020-02-02 NOTE — Progress Notes (Signed)
Metrics: Intervention Frequency ACO  Documented Smoking Status Yearly  Screened one or more times in 24 months  Cessation Counseling or  Active cessation medication Past 24 months  Past 24 months   Guideline developer: UpToDate (See UpToDate for funding source) Date Released: 2014       Wellness Office Visit  Subjective:  Patient ID: Beverly Atkinson, female    DOB: [birth year]  Age: 40 y.o. MRN: [id number]  CC: This lady comes in for follow-up of hypothyroidism, PCOS and vitamin D deficiency as well as obesity. HPI  Overall, she is trying to do better with nutrition with intermittent fasting.  She has been taking vitamin D3 10,000 units daily.  She has trouble with insomnia and she has not tried melatonin from life extension yet but will do so. She is tolerating desiccated NP thyroid 90 mg daily. She continues to take progesterone at night in relation to PCOS. She is not exercising on a regular basis at the present time as she works very long hours many days in a week. Past Medical History:  Diagnosis Date  . Insomnia   . Restless leg   . Thyroid disease    Past Surgical History:  Procedure Laterality Date  . CHOLECYSTECTOMY     2015  . EYE SURGERY     Lazy eye at age 79  . LAPAROSCOPIC ENDOMETRIOSIS FULGURATION  08/23/2015     Family History  Problem Relation Age of Onset  . Drug abuse Mother   . Mental illness Mother   . Thyroid disease Mother   . Cancer Father   . 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   Social History   Tobacco Use  . Smoking status: Never Smoker  . Smokeless tobacco: Never Used  Substance Use Topics  . Alcohol use: Yes    Comment: occ    Current Meds  Medication Sig  . B Complex Vitamins (VITAMIN B COMPLEX PO) Take by mouth.  . Calcium Carbonate-Vitamin D 300-100 MG-UNIT CAPS Take by mouth.  . cyclobenzaprine (FLEXERIL) 10 MG tablet TAKE 1  TABLET BY MOUTH 3  TIMES DAILY AS NEEDED FOR  MUSCLE SPASM(S)  . Levonorgestrel-Ethinyl Estradiol (AMETHIA) 0.15-0.03 &0.01 MG tablet Take 1 tablet by mouth daily.  . Levonorgestrel-Ethinyl Estradiol (AMETHIA) 0.15-0.03 &0.01 MG tablet Jaimiess 0.15 mg-30 mcg (84)/10 mcg(7) tablets,3 month dose pack  . linaclotide (LINZESS) 145 MCG CAPS capsule Take 1 capsule (145 mcg total) by mouth daily before breakfast.  . Multiple Vitamins-Minerals (MULTIVITAMIN PO) Take by mouth.  . NP THYROID 90 MG tablet Take 1 tablet (90 mg total) by mouth daily.  . ondansetron (ZOFRAN ODT) 4 MG disintegrating tablet Take 1 tablet (4 mg total) by mouth every 8 (eight) hours as needed for nausea or vomiting.  Marland Kitchen OVER THE COUNTER MEDICATION Thyroid complex daily  . OVER THE COUNTER MEDICATION Take 1 capsule by mouth daily as needed. The [employer] Thyroid complex.  . progesterone (PROMETRIUM) 200 MG capsule Take 1 capsule (200 mg total) by mouth daily.  . sertraline (ZOLOFT) 100 MG tablet TAKE 1 TABLET BY MOUTH  DAILY  . temazepam (RESTORIL) 30 MG capsule Take 1 capsule (30 mg total) by mouth at bedtime as needed for sleep.  . traMADol (ULTRAM) 50 MG tablet Take 1 tablet (50 mg total) by mouth every 12 (twelve) hours as needed.  . Turmeric Curcumin 500 MG CAPS Take 1  capsule by mouth daily.      Depression screen [facility] 2/9 02/02/2020 08/21/2019 08/21/2019 01/30/2019 10/24/2018  Decreased Interest 1 2 1  0 0  Down, Depressed, Hopeless 1 1 0 0 0  PHQ - 2 Score 2 3 1  0 0  Altered sleeping 1 0 - - 0  Tired, decreased energy 1 3 - - 0  Change in appetite 1 2 - - 0  Feeling bad or failure about yourself  0 0 - - 0  Trouble concentrating 1 1 - - 0  Moving slowly or fidgety/restless 0 0 - - 0  Suicidal thoughts 0 0 - - 0  PHQ-9 Score 6 9 - - 0  Difficult doing work/chores Somewhat difficult Not difficult at all - - Not difficult at all     Objective:   Today's Vitals: BP 124/78   Pulse 92   Temp (!) 97.1 F (36.2 C)  (Temporal)   Ht 5' (1.524 m)   Wt 187 lb 12.8 oz (85.2 kg)   SpO2 98%   BMI 36.68 kg/m  Vitals with BMI 02/02/2020 11/30/2019 10/14/2019  Height 5\' 0"  5\' 0"  5\' 0"   Weight 187 lbs 13 oz 189 lbs 194 lbs 10 oz  BMI 36.68 36.91 38.01  Systolic 124 118 13/08/2019  Diastolic 78 74 80  Pulse 92 96 80     Physical Exam  She look systemically well.  She remains obese but has lost 2 pounds since her last visit.  Blood pressure is in a good range.     Assessment   1. PCOS (polycystic ovarian syndrome)   2. Primary hypothyroidism   3. Vitamin D deficiency disease       Tests ordered Orders Placed This Encounter  Procedures  . COMPLETE METABOLIC PANEL WITH GFR  . T3, free  . TSH  . VITAMIN D 25 Hydroxy (Vit-D Deficiency, Fractures)     Plan: 1. She will continue with nutrition to reduce visceral fat in relation to PCOS. 2. She will continue with the same dose that desiccated NP thyroid and we will check levels today to see if we need to optimize further.  I suspect we will. 3. She will continue with vitamin D3 10,000 units daily and I will check levels today. 4. Follow-up in about 6 to 8 weeks to see how she is doing.  She will get COVID-19 booster dose soon.   No orders of the defined types were placed in this encounter.   10/16/2019, MD

## 2020-02-03 LAB — COMPLETE METABOLIC PANEL WITHOUT GFR
AG Ratio: 1.4 (calc) (ref 1.0–2.5)
ALT: 14 U/L (ref 6–29)
AST: 18 U/L (ref 10–30)
Albumin: 3.7 g/dL (ref 3.6–5.1)
Alkaline phosphatase (APISO): 60 U/L (ref 31–125)
BUN: 10 mg/dL (ref 7–25)
CO2: 25 mmol/L (ref 20–32)
Calcium: 9 mg/dL (ref 8.6–10.2)
Chloride: 105 mmol/L (ref 98–110)
Creat: 0.8 mg/dL (ref 0.50–1.10)
GFR, Est African American: 106 mL/min/1.73m2
GFR, Est Non African American: 92 mL/min/1.73m2
Globulin: 2.7 g/dL (ref 1.9–3.7)
Glucose, Bld: 175 mg/dL — ABNORMAL HIGH (ref 65–139)
Potassium: 3.8 mmol/L (ref 3.5–5.3)
Sodium: 139 mmol/L (ref 135–146)
Total Bilirubin: 0.6 mg/dL (ref 0.2–1.2)
Total Protein: 6.4 g/dL (ref 6.1–8.1)

## 2020-02-03 LAB — TSH: TSH: 0.45 mIU/L

## 2020-02-03 LAB — T3, FREE: T3, Free: 5 pg/mL — ABNORMAL HIGH (ref 2.3–4.2)

## 2020-02-03 LAB — VITAMIN D 25 HYDROXY (VIT D DEFICIENCY, FRACTURES): Vit D, 25-Hydroxy: 39 ng/mL (ref 30–100)

## 2020-02-28 ENCOUNTER — Other Ambulatory Visit (INDEPENDENT_AMBULATORY_CARE_PROVIDER_SITE_OTHER): Payer: Self-pay | Admitting: Internal Medicine

## 2020-03-16 ENCOUNTER — Ambulatory Visit (INDEPENDENT_AMBULATORY_CARE_PROVIDER_SITE_OTHER): Payer: 59 | Admitting: Internal Medicine

## 2020-03-16 ENCOUNTER — Other Ambulatory Visit: Payer: Self-pay

## 2020-03-16 ENCOUNTER — Encounter (INDEPENDENT_AMBULATORY_CARE_PROVIDER_SITE_OTHER): Payer: Self-pay | Admitting: Internal Medicine

## 2020-03-16 VITALS — BP 140/72 | HR 82 | Temp 98.6°F | Resp 18 | Ht 60.0 in | Wt 189.0 lb

## 2020-03-16 DIAGNOSIS — E282 Polycystic ovarian syndrome: Secondary | ICD-10-CM | POA: Diagnosis not present

## 2020-03-16 DIAGNOSIS — E039 Hypothyroidism, unspecified: Secondary | ICD-10-CM | POA: Diagnosis not present

## 2020-03-16 DIAGNOSIS — E559 Vitamin D deficiency, unspecified: Secondary | ICD-10-CM | POA: Diagnosis not present

## 2020-03-16 DIAGNOSIS — E669 Obesity, unspecified: Secondary | ICD-10-CM | POA: Diagnosis not present

## 2020-03-16 MED ORDER — NP THYROID 120 MG PO TABS
120.0000 mg | ORAL_TABLET | Freq: Every day | ORAL | 3 refills | Status: DC
Start: 2020-03-16 — End: 2020-08-03

## 2020-03-16 NOTE — Progress Notes (Signed)
Metrics: Intervention Frequency ACO  Documented Smoking Status Yearly  Screened one or more times in 24 months  Cessation Counseling or  Active cessation medication Past 24 months  Past 24 months   Guideline developer: UpToDate (See UpToDate for funding source) Date Released: 2014       Wellness Office Visit  Subjective:  Patient ID: [facility], female    DOB: April 09, 1980  Age: 40 y.o. MRN: [id number]  CC: This lady comes in for follow-up of hypothyroidism, PCOS, vitamin D deficiency and obesity. HPI  She continues with NP thyroid 90 mg daily and although her T3 was elevated, TSH was not suppressed yet.  However, she has not been as consistent with nutrition as she would like to be. She continues with progesterone at night. She has been taking vitamin D3 but actually taking 20,000 units a day.   Past Medical History:  Diagnosis Date  . Insomnia   . Restless leg   . Thyroid disease    Past Surgical History:  Procedure Laterality Date  . CHOLECYSTECTOMY     2015  . EYE SURGERY     Lazy eye at age 89  . LAPAROSCOPIC ENDOMETRIOSIS FULGURATION  08/23/2015     Family History  Problem Relation Age of Onset  . Drug abuse Mother   . Mental illness Mother   . Thyroid disease Mother   . Cancer Father   . 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   Social History   Tobacco Use  . Smoking status: Never Smoker  . Smokeless tobacco: Never Used  Substance Use Topics  . Alcohol use: Yes    Comment: occ    Current Meds  Medication Sig  . B Complex Vitamins (VITAMIN B COMPLEX PO) Take by mouth.  . Calcium Carbonate-Vitamin D 300-100 MG-UNIT CAPS Take by mouth.  . Levonorgestrel-Ethinyl Estradiol (AMETHIA) 0.15-0.03 &0.01 MG tablet Jaimiess 0.15 mg-30 mcg (84)/10 mcg(7) tablets,3 month dose pack  . linaclotide (LINZESS) 145 MCG CAPS capsule Take 1 capsule (145 mcg total) by mouth  daily before breakfast.  . Multiple Vitamins-Minerals (MULTIVITAMIN PO) Take by mouth.  . NP THYROID 120 MG tablet Take 1 tablet (120 mg total) by mouth daily before breakfast.  . NP THYROID 90 MG tablet Take 1 tablet by mouth once daily  . ondansetron (ZOFRAN ODT) 4 MG disintegrating tablet Take 1 tablet (4 mg total) by mouth every 8 (eight) hours as needed for nausea or vomiting.  Marland Kitchen OVER THE COUNTER MEDICATION Thyroid complex daily  . OVER THE COUNTER MEDICATION Take 1 capsule by mouth daily as needed. [employer] Thyroid complex.  . progesterone (PROMETRIUM) 200 MG capsule Take 1 capsule by mouth once daily  . sertraline (ZOLOFT) 100 MG tablet TAKE 1 TABLET BY MOUTH  DAILY  . traMADol (ULTRAM) 50 MG tablet Take 1 tablet (50 mg total) by mouth every 12 (twelve) hours as needed.  . Turmeric Curcumin 500 MG CAPS Take 1 capsule by mouth daily.     Flowsheet Row Office Visit from 02/02/2020 in [facility]  PHQ-9 Total Score 6      Objective:   Today's Vitals: BP 140/72 (BP Location: Left Arm, Patient Position: Sitting, Cuff Size: Normal)   Pulse 82   Temp 98.6 F (37 C) (Temporal)   Resp 18   Ht 5' (1.524 m)   Wt 189 lb (85.7 kg)  SpO2 97%   BMI 36.91 kg/m  Vitals with BMI 03/16/2020 02/02/2020 11/30/2019  Height 5\' 0"  5\' 0"  5\' 0"   Weight 189 lbs 187 lbs 13 oz 189 lbs  BMI 36.91 36.68 36.91  Systolic 140 124  Diastolic 72 78 74  Pulse 82 92 96     Physical Exam  She remains obese and she has gained 2 pounds since her last visit.     Assessment   1. PCOS (polycystic ovarian syndrome)   2. Primary hypothyroidism   3. Vitamin D deficiency disease   4. Obesity (BMI 30-39.9)       Tests ordered No orders of the defined types were placed in this encounter.    Plan: 1. I think we need to further optimize thyroid and I have sent a new prescription for NP thyroid 120 mg daily. 2. She will continue to work on nutrition to lose visceral fat. 3. I  have told her that reasonable dose of vitamin D3 would be 10,000 units daily instead of 20,000 units daily. 4. Follow-up in about 4 to 6 weeks time and then we will probably do blood work then.   Meds ordered this encounter  Medications  . NP THYROID 120 MG tablet    Sig: Take 1 tablet (120 mg total) by mouth daily before breakfast.    Dispense:  30 tablet    Refill:  3    Zaiya Annunziato , MD

## 2020-04-20 ENCOUNTER — Other Ambulatory Visit: Payer: Self-pay

## 2020-04-20 ENCOUNTER — Ambulatory Visit (INDEPENDENT_AMBULATORY_CARE_PROVIDER_SITE_OTHER): Payer: 59 | Admitting: Internal Medicine

## 2020-04-20 ENCOUNTER — Encounter (INDEPENDENT_AMBULATORY_CARE_PROVIDER_SITE_OTHER): Payer: Self-pay | Admitting: Internal Medicine

## 2020-04-20 VITALS — BP 140/100 | HR 98 | Temp 97.7°F | Resp 18 | Ht 60.0 in | Wt 189.0 lb

## 2020-04-20 DIAGNOSIS — J0100 Acute maxillary sinusitis, unspecified: Secondary | ICD-10-CM

## 2020-04-20 DIAGNOSIS — E039 Hypothyroidism, unspecified: Secondary | ICD-10-CM

## 2020-04-20 DIAGNOSIS — E559 Vitamin D deficiency, unspecified: Secondary | ICD-10-CM | POA: Diagnosis not present

## 2020-04-20 MED ORDER — AMOXICILLIN-POT CLAVULANATE 875-125 MG PO TABS: 1.0000 | ORAL_TABLET | Freq: Two times a day (BID) | ORAL | 0 refills | Status: DC

## 2020-04-20 NOTE — Progress Notes (Signed)
Metrics: Intervention Frequency ACO  Documented Smoking Status Yearly  Screened one or more times in 24 months  Cessation Counseling or  Active cessation medication Past 24 months  Past 24 months   Guideline developer: UpToDate (See UpToDate for funding source) Date Released: 2014       Wellness Office Visit  Subjective:  Patient ID: Beverly Atkinson, female    DOB: [birth year]  Age: 40 y.o. MRN: [id number]  CC: Facial/sinus congestion HPI  This lady comes in with a 2-day history of the above symptoms with pressure and pain in her sinuses associated with postnasal drainage and a cough productive of yellow/green sputum.  She has also had a sore throat.  She did not have a technical fever but she felt hot.  She did take a COVID-19 test at home yesterday and it was negative.  She denies any dyspnea. She also has been taking the higher dose of NP thyroid 120 mg daily and she is tolerating this well. She has been taking vitamin D3 10,000 units daily. Unfortunately, her nutrition has been quite variable. Past Medical History:  Diagnosis Date  . Insomnia   . Restless leg   . Thyroid disease    Past Surgical History:  Procedure Laterality Date  . CHOLECYSTECTOMY     2015  . EYE SURGERY     Lazy eye at age 35  . LAPAROSCOPIC ENDOMETRIOSIS FULGURATION  08/23/2015     Family History  Problem Relation Age of Onset  . Drug abuse Mother   . Mental illness Mother   . Thyroid disease Mother   . Cancer Father   . 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   Social History   Tobacco Use  . Smoking status: Never Smoker  . Smokeless tobacco: Never Used  Substance Use Topics  . Alcohol use: Yes    Comment: occ    Current Meds  Medication Sig  . amoxicillin-clavulanate (AUGMENTIN) 875-125 MG tablet Take 1 tablet by mouth 2 (two) times daily.  . B Complex Vitamins (VITAMIN B COMPLEX PO)  Take by mouth.  . Calcium Carbonate-Vitamin D 300-100 MG-UNIT CAPS Take by mouth.  . Levonorgestrel-Ethinyl Estradiol (AMETHIA) 0.15-0.03 &0.01 MG tablet Jaimiess 0.15 mg-30 mcg (84)/10 mcg(7) tablets,3 month dose pack  . Multiple Vitamins-Minerals (MULTIVITAMIN PO) Take by mouth.  . NP THYROID 120 MG tablet Take 1 tablet (120 mg total) by mouth daily before breakfast.  . ondansetron (ZOFRAN ODT) 4 MG disintegrating tablet Take 1 tablet (4 mg total) by mouth every 8 (eight) hours as needed for nausea or vomiting.  Marland Kitchen OVER THE COUNTER MEDICATION Take 1 capsule by mouth daily as needed. The [employer] shop[employer] Thyroid complex.  . progesterone (PROMETRIUM) 200 MG capsule Take 1 capsule by mouth once daily  . sertraline (ZOLOFT) 100 MG tablet TAKE 1 TABLET BY MOUTH  DAILY  . traMADol (ULTRAM) 50 MG tablet Take 1 tablet (50 mg total) by mouth every 12 (twelve) hours as needed.  . Turmeric Curcumin 500 MG CAPS Take 1 capsule by mouth daily.     Flowsheet Row Office Visit from 02/02/2020 in [facility]  PHQ-9 Total Score 6      Objective:   Today's Vitals: BP (!) 140/100 (BP Location: Right Arm, Patient Position: Sitting, Cuff Size: Normal)   Pulse 98   Temp 97.7 F (36.5 C) (Temporal)   Resp 18   Ht  5' (1.524 m)   Wt 189 lb (85.7 kg)   SpO2 97%   BMI 36.91 kg/m  Vitals with BMI 04/20/2020 03/16/2020 02/02/2020  Height 5\' 0"  5\' 0"  5\' 0"   Weight 189 lbs 189 lbs 187 lbs 13 oz  BMI 36.91 36.91 36.68  Systolic 140 140  Diastolic 100 72 78  Pulse 98 82 92     Physical Exam  She looks systemically well.  She is afebrile.  He does have bilateral frontal and maxillary sinus tenderness.  Lung fields are clear.  There is no wheezing, crackles or bronchial breathing.      Assessment   1. Acute non-recurrent maxillary sinusitis   2. Primary hypothyroidism   3. Vitamin D deficiency disease       Tests ordered Orders Placed This Encounter  Procedures  . COMPLETE METABOLIC  PANEL WITH GFR  . VITAMIN D 25 Hydroxy (Vit-D Deficiency, Fractures)  . T3, free  . TSH     Plan: 1. Although I think this is an acute sinus infection and I will prescribe Augmentin, I did tell her to repeat the COVID-19 test tomorrow in case the first 1 yesterday was a false negative. 2. She will continue with NP thyroid 120 mg daily and we will check thyroid function today to see if we need to make further adjustments. 3. Continue with vitamin D3 10,000 units daily and I will check vitamin D levels today. 4. Follow-up in about 2 months time.   Meds ordered this encounter  Medications  . amoxicillin-clavulanate (AUGMENTIN) 875-125 MG tablet    Sig: Take 1 tablet by mouth 2 (two) times daily.    Dispense:  20 tablet    Refill:  0    Dandria Griego , MD

## 2020-04-21 ENCOUNTER — Other Ambulatory Visit (INDEPENDENT_AMBULATORY_CARE_PROVIDER_SITE_OTHER): Payer: Self-pay | Admitting: Internal Medicine

## 2020-04-21 LAB — COMPLETE METABOLIC PANEL WITH GFR
AG Ratio: 1.4 (calc) (ref 1.0–2.5)
ALT: 13 U/L (ref 6–29)
AST: 18 U/L (ref 10–30)
Albumin: 3.7 g/dL (ref 3.6–5.1)
Alkaline phosphatase (APISO): 70 U/L (ref 31–125)
BUN: 15 mg/dL (ref 7–25)
CO2: 24 mmol/L (ref 20–32)
Calcium: 8.9 mg/dL (ref 8.6–10.2)
Chloride: 107 mmol/L (ref 98–110)
Creat: 0.73 mg/dL (ref 0.50–1.10)
GFR, Est African American: 119 mL/min/{1.73_m2} (ref >=60)
GFR, Est Non African American: 102 mL/min/{1.73_m2} (ref >=60)
Globulin: 2.6 g/dL (calc) (ref 1.9–3.7)
Glucose, Bld: 159 mg/dL — ABNORMAL HIGH (ref 65–139)
Potassium: 3.8 mmol/L (ref 3.5–5.3)
Sodium: 140 mmol/L (ref 135–146)
Total Bilirubin: 0.3 mg/dL (ref 0.2–1.2)
Total Protein: 6.3 g/dL (ref 6.1–8.1)

## 2020-04-21 LAB — T3, FREE: T3, Free: 3.8 pg/mL (ref 2.3–4.2)

## 2020-04-21 LAB — VITAMIN D 25 HYDROXY (VIT D DEFICIENCY, FRACTURES): Vit D, 25-Hydroxy: 58 ng/mL (ref 30–100)

## 2020-04-21 LAB — TSH: TSH: 0.06 mIU/L — ABNORMAL LOW

## 2020-04-21 MED ORDER — NP THYROID 30 MG PO TABS: 30.0000 mg | ORAL_TABLET | Freq: Every day | ORAL | 3 refills | Status: DC

## 2020-04-27 ENCOUNTER — Ambulatory Visit (INDEPENDENT_AMBULATORY_CARE_PROVIDER_SITE_OTHER): Payer: 59 | Admitting: Internal Medicine

## 2020-05-25 ENCOUNTER — Other Ambulatory Visit: Payer: Self-pay | Admitting: Family

## 2020-06-21 ENCOUNTER — Ambulatory Visit (INDEPENDENT_AMBULATORY_CARE_PROVIDER_SITE_OTHER): Payer: 59 | Admitting: Internal Medicine

## 2020-08-03 ENCOUNTER — Other Ambulatory Visit (INDEPENDENT_AMBULATORY_CARE_PROVIDER_SITE_OTHER): Payer: Self-pay | Admitting: Internal Medicine

## 2020-08-03 ENCOUNTER — Telehealth (INDEPENDENT_AMBULATORY_CARE_PROVIDER_SITE_OTHER): Payer: Self-pay | Admitting: Internal Medicine

## 2020-08-03 MED ORDER — NP THYROID 30 MG PO TABS: 30.0000 mg | ORAL_TABLET | Freq: Every day | ORAL | 3 refills | Status: DC

## 2020-08-03 MED ORDER — NP THYROID 120 MG PO TABS: 120.0000 mg | ORAL_TABLET | Freq: Every day | ORAL | 3 refills | Status: DC

## 2020-08-03 NOTE — Telephone Encounter (Signed)
Done

## 2020-08-09 ENCOUNTER — Other Ambulatory Visit: Payer: Self-pay

## 2020-08-09 ENCOUNTER — Encounter (INDEPENDENT_AMBULATORY_CARE_PROVIDER_SITE_OTHER): Payer: Self-pay | Admitting: Internal Medicine

## 2020-08-09 ENCOUNTER — Ambulatory Visit (INDEPENDENT_AMBULATORY_CARE_PROVIDER_SITE_OTHER): Payer: 59 | Admitting: Internal Medicine

## 2020-08-09 VITALS — BP 144/90 | HR 106 | Temp 97.7°F | Ht 60.0 in | Wt 184.4 lb

## 2020-08-09 DIAGNOSIS — E559 Vitamin D deficiency, unspecified: Secondary | ICD-10-CM | POA: Diagnosis not present

## 2020-08-09 DIAGNOSIS — E282 Polycystic ovarian syndrome: Secondary | ICD-10-CM

## 2020-08-09 DIAGNOSIS — E039 Hypothyroidism, unspecified: Secondary | ICD-10-CM | POA: Diagnosis not present

## 2020-08-09 MED ORDER — PROGESTERONE 200 MG PO CAPS: 200.0000 mg | ORAL_CAPSULE | Freq: Every evening | ORAL | 1 refills | Status: DC

## 2020-08-09 NOTE — Progress Notes (Signed)
Metrics: Intervention Frequency ACO  Documented Smoking Status Yearly  Screened one or more times in 24 months  Cessation Counseling or  Active cessation medication Past 24 months  Past 24 months   Guideline developer: UpToDate (See UpToDate for funding source) Date Released: 2014       Wellness Office Visit  Subjective:  Patient ID: Beverly Atkinson, female    DOB: [birth year]  Age: 40 y.o. MRN: [id number]  CC: This lady comes in for follow-up of hypothyroidism, PCOS, vitamin D deficiency and obesity. HPI  She has been challenged because her work is very stressful and they are short staffed at her work.  Nonetheless, she has managed to lose weight.  She has tolerated higher doses of NP thyroid. She continues to take vitamin D3 supplementation for vitamin D deficiency. 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   Social History   Tobacco Use   Smoking status: Never   Smokeless tobacco: Never  Substance Use Topics   Alcohol use: Yes    Comment: occ    Current Meds  Medication Sig   B Complex Vitamins (VITAMIN B COMPLEX PO) Take by mouth.   Calcium Carbonate-Vitamin D 300-100 MG-UNIT CAPS Take by mouth.   cyclobenzaprine (FLEXERIL) 10 MG tablet TAKE 1 TABLET BY MOUTH 3  TIMES DAILY AS NEEDED FOR  MUSCLE SPASM(S)   Levonorgestrel-Ethinyl Estradiol (AMETHIA) 0.15-0.03 &0.01 MG tablet Jaimiess 0.15 mg-30 mcg (84)/10 mcg(7) tablets,3 month dose pack   Multiple Vitamins-Minerals  (MULTIVITAMIN PO) Take by mouth.   NP THYROID 120 MG tablet Take 1 tablet (120 mg total) by mouth daily before breakfast.   NP THYROID 30 MG tablet Take 1 tablet (30 mg total) by mouth daily before lunch.   ondansetron (ZOFRAN ODT) 4 MG disintegrating tablet Take 1 tablet (4 mg total) by mouth every 8 (eight) hours as needed for nausea or vomiting.   OVER THE COUNTER MEDICATION Take 1 capsule by mouth daily as needed. The [employer] Thyroid complex.   sertraline (ZOLOFT) 100 MG tablet TAKE 1 TABLET BY MOUTH  DAILY   temazepam (RESTORIL) 30 MG capsule Take 1 capsule (30 mg total) by mouth at bedtime as needed for sleep.   Turmeric Curcumin 500 MG CAPS Take 1 capsule by mouth daily.   [DISCONTINUED] progesterone (PROMETRIUM) 200 MG capsule Take 1 capsule by mouth once daily     Flowsheet Row Office Visit from 02/02/2020 in [facility]  PHQ-9 Total Score 6       Objective:   Today's Vitals: BP (!) 144/90   Pulse (!) 106   Temp 97.7 F (36.5 C) (Temporal)   Ht 5' (1.524 m)   Wt 184 lb 6.4 oz (83.6 kg)   SpO2 99%   BMI 36.01 kg/m  Vitals with BMI 08/09/2020 04/20/2020 03/16/2020  Height 5\' 0"  5\' 0"  5\' 0"   Weight 184 lbs 6 oz 189 lbs 189 lbs  BMI 36.01 36.91 36.91  Systolic 144 140 852  Diastolic 90 100 72  Pulse 106 98 82     Physical Exam   She remains obese.  Blood pressure somewhat elevated today.    Assessment   1. PCOS (polycystic ovarian syndrome)   2. Primary hypothyroidism   3. Vitamin D deficiency disease       Tests ordered No orders of the defined types were placed in this encounter.    Plan: 1.  Continue to focus on nutrition for PCOS. 2.  Continue with NP thyroid 120 mg in the morning and NP thyroid 30 mg at lunchtime for her hypothyroidism. 3.  Continue with vitamin D3 supplementation for vitamin D deficiency. 4.  We will need to monitor the blood pressure as it seems to have been elevated the last couple of times. 5.  Follow-up in 2  months for close monitoring.    Meds ordered this encounter  Medications   progesterone (PROMETRIUM) 200 MG capsule    Sig: Take 1 capsule (200 mg total) by mouth at bedtime.    Dispense:  90 capsule    Refill:  1     Carlon Davidson Normajean Glasgow, MD

## 2020-10-18 ENCOUNTER — Ambulatory Visit (INDEPENDENT_AMBULATORY_CARE_PROVIDER_SITE_OTHER): Payer: 59 | Admitting: Internal Medicine

## 2021-02-08 ENCOUNTER — Encounter: Payer: Self-pay | Admitting: Family Medicine

## 2021-02-08 ENCOUNTER — Ambulatory Visit: Payer: BC Managed Care – PPO | Admitting: Family Medicine

## 2021-02-08 VITALS — BP 118/70 | HR 88 | Temp 98.3°F | Resp 17 | Ht 60.0 in | Wt 185.6 lb

## 2021-02-08 DIAGNOSIS — E038 Other specified hypothyroidism: Secondary | ICD-10-CM

## 2021-02-08 DIAGNOSIS — G4700 Insomnia, unspecified: Secondary | ICD-10-CM

## 2021-02-08 DIAGNOSIS — G2581 Restless legs syndrome: Secondary | ICD-10-CM | POA: Diagnosis not present

## 2021-02-08 DIAGNOSIS — K0889 Other specified disorders of teeth and supporting structures: Secondary | ICD-10-CM | POA: Diagnosis not present

## 2021-02-08 DIAGNOSIS — E063 Autoimmune thyroiditis: Secondary | ICD-10-CM

## 2021-02-08 LAB — CBC
HCT: 39.7 % (ref 36.0–46.0)
Hemoglobin: 13.2 g/dL (ref 12.0–15.0)
MCHC: 33.4 g/dL (ref 30.0–36.0)
MCV: 85.9 fl (ref 78.0–100.0)
Platelets: 352 10*3/uL (ref 150.0–400.0)
RBC: 4.62 Mil/uL (ref 3.87–5.11)
RDW: 13.6 % (ref 11.5–15.5)
WBC: 9.4 10*3/uL (ref 4.0–10.5)

## 2021-02-08 LAB — TSH: TSH: 0 u[IU]/mL — ABNORMAL LOW (ref 0.35–5.50)

## 2021-02-08 MED ORDER — TEMAZEPAM 15 MG PO CAPS
15.0000 mg | ORAL_CAPSULE | Freq: Every evening | ORAL | 0 refills | Status: DC | PRN
Start: 2021-02-08 — End: 2021-03-01

## 2021-02-08 MED ORDER — NP THYROID 120 MG PO TABS: 120.0000 mg | ORAL_TABLET | Freq: Every day | ORAL | 3 refills | Status: DC

## 2021-02-08 NOTE — Patient Instructions (Addendum)
I do recommend continuing Zoloft, can try taking in am.  Can try restoril - 15mg  initially and follow up to discuss sleep in next month. I will check levels for thyroid, but if abnormal would recommend follow up with endocrinology.  If any new or worsening dental or jaw pain be seen right away. If concerns on CBC will let you know. If higher temp be seen.  Return to the clinic or go to the nearest emergency room if any of your symptoms worsen or new symptoms occur.    Insomnia Insomnia is a sleep disorder that makes it difficult to fall asleep or stay asleep. Insomnia can cause fatigue, low energy, difficulty concentrating, mood swings, and poor performance at work or school. There are three different ways to classify insomnia: Difficulty falling asleep. Difficulty staying asleep. Waking up too early in the morning. Any type of insomnia can be long-term (chronic) or short-term (acute). Both are common. Short-term insomnia usually lasts for three months or less. Chronic insomnia occurs at least three times a week for longer than three months. What are the causes? Insomnia may be caused by another condition, situation, or substance, such as: Anxiety. Certain medicines. Gastroesophageal reflux disease (GERD) or other gastrointestinal conditions. Asthma or other breathing conditions. Restless legs syndrome, sleep apnea, or other sleep disorders. Chronic pain. Menopause. Stroke. Abuse of alcohol, tobacco, or illegal drugs. Mental health conditions, such as depression. Caffeine. Neurological disorders, such as Alzheimer's disease. An overactive thyroid (hyperthyroidism). Sometimes, the cause of insomnia may not be known. What increases the risk? Risk factors for insomnia include: Gender. Women are affected more often than men. Age. Insomnia is more common as you get older. Stress. Lack of exercise. Irregular work schedule or working night shifts. Traveling between different time  zones. Certain medical and mental health conditions. What are the signs or symptoms? If you have insomnia, the main symptom is having trouble falling asleep or having trouble staying asleep. This may lead to other symptoms, such as: Feeling fatigued or having low energy. Feeling nervous about going to sleep. Not feeling rested in the morning. Having trouble concentrating. Feeling irritable, anxious, or depressed. How is this diagnosed? This condition may be diagnosed based on: Your symptoms and medical history. Your health care provider may ask about: Your sleep habits. Any medical conditions you have. Your mental health. A physical exam. How is this treated? Treatment for insomnia depends on the cause. Treatment may focus on treating an underlying condition that is causing insomnia. Treatment may also include: Medicines to help you sleep. Counseling or therapy. Lifestyle adjustments to help you sleep better. Follow these instructions at home: Eating and drinking  Limit or avoid alcohol, caffeinated beverages, and cigarettes, especially close to bedtime. These can disrupt your sleep. Do not eat a large meal or eat spicy foods right before bedtime. This can lead to digestive discomfort that can make it hard for you to sleep. Sleep habits  Keep a sleep diary to help you and your health care provider figure out what could be causing your insomnia. Write down: When you sleep. When you wake up during the night. How well you sleep. How rested you feel the next day. Any side effects of medicines you are taking. What you eat and drink. Make your bedroom a dark, comfortable place where it is easy to fall asleep. Put up shades or blackout curtains to block light from outside. Use a white noise machine to block noise. Keep the temperature cool. Limit screen use  before bedtime. This includes: Watching TV. Using your smartphone, tablet, or computer. Stick to a routine that includes going  to bed and waking up at the same times every day and night. This can help you fall asleep faster. Consider making a quiet activity, such as reading, part of your nighttime routine. Try to avoid taking naps during the day so that you sleep better at night. Get out of bed if you are still awake after 15 minutes of trying to sleep. Keep the lights down, but try reading or doing a quiet activity. When you feel sleepy, go back to bed. General instructions Take over-the-counter and prescription medicines only as told by your health care provider. Exercise regularly, as told by your health care provider. Avoid exercise starting several hours before bedtime. Use relaxation techniques to manage stress. Ask your health care provider to suggest some techniques that may work well for you. These may include: Breathing exercises. Routines to release muscle tension. Visualizing peaceful scenes. Make sure that you drive carefully. Avoid driving if you feel very sleepy. Keep all follow-up visits as told by your health care provider. This is important. Contact a health care provider if: You are tired throughout the day. You have trouble in your daily routine due to sleepiness. You continue to have sleep problems, or your sleep problems get worse. Get help right away if: You have serious thoughts about hurting yourself or someone else. If you ever feel like you may hurt yourself or others, or have thoughts about taking your own life, get help right away. You can go to your nearest emergency department or call: Your local emergency services (911 in the [place] A suicide crisis helpline, such as the National Suicide Prevention Lifeline at (214) 831-1643 or 988 in the [place] This is open 24 hours a day. Summary Insomnia is a sleep disorder that makes it difficult to fall asleep or stay asleep. Insomnia can be long-term (chronic) or short-term (acute). Treatment for insomnia depends on the cause. Treatment may focus  on treating an underlying condition that is causing insomnia. Keep a sleep diary to help you and your health care provider figure out what could be causing your insomnia. This information is not intended to replace advice given to you by your health care provider. Make sure you discuss any questions you have with your health care provider. Document Revised: 08/03/2020 Document Reviewed: 11/19/2019 Elsevier Patient Education  2022 [employer].

## 2021-02-08 NOTE — Progress Notes (Addendum)
Subjective:  Patient ID: Beverly Bracketmanda Barga, female    DOB: 04/21/1980  Age: 41 y.o. MRN: [id number]  CC:  Chief Complaint  Patient presents with   Establish Care    Pt reports here to establish care, was a Dr Karilyn CotaGosrani pt does reports will need medication refills today, would like restirol again or something else for her sleep     HPI Beverly Atkinson presents for   New patient establish care, previous primary care Dr. Karilyn CotaGosrani who is deceased.  [facility]. Past 3 years.   Hypothyroidism: Lab Results  Component Value Date   TSH 0.06 (L) 04/20/2020  History of Hashimoto's thyroiditis per problem list.  Previous treatment with levothyroxine per 09/08/2019 visit with Dr. Karilyn CotaGosrani.  Treated with NP thyroid 120 mg daily.  No recent testing, last TSH level in March 2022. Taking medication daily. Not taking additional 30mg  from Dr. Karilyn CotaGosrani after last labs. Only 120mg .  Fever off and on - has had in past with Hashimotos. 99-99.4.  Appt with dentist in next month. Broken teeth on left side since thanksgiving - saw dentist few weeks ago - no new symptoms since that time. Sore and into jaw - dentist aware.   polycystic ovarian syndrome, history of endometriosis Discussion noted in August 2021. She has been prescribed progesterone 200 mg daily by previous primary care provider to help with insomnia, night sweats, hot flashes. No relief. Off few months.  Prior tramadol if needed for endometriosis pain.  Prior laparoscopic surgery per chart review.  GYN - Dr. Langston MaskerMorris at [facility]. No relief with seasonique.  Does not want try to try metformin d/t blood sugar possible dropping.   Insomnia Treated with Restoril nightly, prior Zoloft for depression. Prior provider did not want her on those meds - weaned off restoril, zoloft,then back on daily past month. Feels better on zoloft. Less depression symptoms, anhedonia. On 100mg  dose now. Tylenol PM, melatonin for  sleep. Waking up every hour on this regimen and trouble getting to sleep still. Restoril worked much better and not acting out dreams.no relief with elavil, trazodone (up tp 100mg ), lunesta, ambien (did not like) or seroquel prior.  No hx of OSA, no snoring.  Controlled substance database (PDMP) reviewed. No concerns appreciated. Tramadol Rx, restoril in 2021.   [facility] 02/08/2021 02/02/2020 08/21/2019 08/21/2019 01/30/2019  Decreased Interest 2 1 2 1  0  Down, Depressed, Hopeless 0 1 1 0 0  PHQ - 2 Score 2 2 3 1  0  Altered sleeping 3 1 0 - -  Tired, decreased energy 3 1 3  - -  Change in appetite 0 1 2 - -  Feeling bad or failure about yourself  0 0 0 - -  Trouble concentrating 1 1 1  - -  Moving slowly or fidgety/restless 0 0 0 - -  Suicidal thoughts 0 0 0 - -  PHQ-9 Score 9 6 9  - -  Difficult doing work/chores - Somewhat difficult Not difficult at all - -  Some recent data might be hidden     History Patient Active Problem List   Diagnosis Date Noted   Constipation 08/21/2019   Hyperlipidemia 07/15/2018   Pain management contract signed 09/27/2017   Opiate dependence (HCC) 09/27/2017   Polycystic ovaries 06/18/2017   History of endometriosis 06/18/2017   Hypothyroidism due to Hashimoto's thyroiditis 06/18/2017   Essential hypertension 06/13/2017   Hashimoto's thyroiditis 03/18/2017   Primary hypothyroidism 03/18/2017   Left  bundle branch block 03/18/2017   Chronic low back pain 03/18/2017   Morbid obesity (HCC) 03/18/2017   Abnormal uterine bleeding (AUB) 08/09/2015   Chronic pelvic pain in female 08/09/2015   Epigastric pain 01/26/2014   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 Authorizing Provider  B Complex Vitamins (VITAMIN B COMPLEX PO) Take by mouth.   Yes [provider]  Calcium Carbonate-Vitamin D 300-100 MG-UNIT CAPS Take by mouth.   Yes [provider]  Multiple Vitamins-Minerals (MULTIVITAMIN PO) Take by mouth.   Yes [provider]  NP THYROID 120 MG tablet Take 1 tablet (120 mg total) by mouth daily before breakfast. 08/03/20  Yes Gosrani, Nimish C, MD  ondansetron (ZOFRAN ODT) 4 MG disintegrating tablet Take 1 tablet (4 mg total) by mouth every 8 (eight) hours as needed for nausea or vomiting. 04/23/19  Yes Hawks, Christy A, FNP  OVER THE COUNTER MEDICATION Take 1 capsule by mouth daily as needed. [employer] Thyroid complex.   Yes [provider]  sertraline (ZOLOFT) 100 MG tablet TAKE 1 TABLET BY MOUTH  DAILY 06/23/19  Yes Hawks, Christy A, FNP  Turmeric Curcumin 500 MG CAPS Take 1 capsule by mouth daily.   Yes [provider]  NP THYROID 30 MG tablet Take 1 tablet (30 mg total) by mouth daily before lunch. Patient not taking: Reported on 02/08/2021 08/03/20   Wilczynski Singer, MD  temazepam (RESTORIL) 30 MG capsule Take 1 capsule (30 mg total) by mouth at bedtime as needed for sleep. Patient not taking: Reported on 02/08/2021 08/21/19   Junie Spencer, FNP   Social History   Socioeconomic History   Marital status: Single    Spouse name: Not on file   Number of children: Not on file   Years of education: Not on file   Highest education level: Not on file  Occupational History   Not on file  Tobacco Use   Smoking status: Never   Smokeless tobacco: Never  Vaping Use   Vaping Use: Never used  Substance and Sexual  Activity   Alcohol use: Yes    Comment: occ   Drug use: No   Sexual activity: Not Currently  Other Topics Concern   Not on file  Social History Narrative   Single,lives alone.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   Social Determinants of Health   Financial Resource Strain: Not on file  Food Insecurity: Not on file  Transportation Needs: Not on file  Physical Activity: Not on file  Stress: Not on file  Social Connections: Not on file  Intimate Partner Violence: Not on file    Review of Systems Per HPI.   Objective:   Vitals:   02/08/21 0939  BP: 118/70  Pulse: 88  Resp: 17  Temp: 98.3 F (  36.8 C)  TempSrc: Temporal  SpO2: 100%  Weight: 185 lb 9.6 oz (84.2 kg)  Height: 5' (1.524 m)     Physical Exam Vitals reviewed.  Constitutional:      Appearance: Normal appearance. She is well-developed.  HENT:     Head: Normocephalic and atraumatic.     Mouth/Throat:     Comments: Broken tooth/decay of the left lower molar without surrounding gum erythema, edema or exudate.  Nontender with percussion.  No appreciable jaw swelling or asymmetry, no lymphadenopathy of neck appreciated. Eyes:     Conjunctiva/sclera: Conjunctivae normal.     Pupils: Pupils are equal, round, and reactive to light.  Neck:     Vascular: No carotid bruit.  Cardiovascular:     Rate and Rhythm: Normal rate and regular rhythm.     Heart sounds: Normal heart sounds.  Pulmonary:     Effort: Pulmonary effort is normal.     Breath sounds: Normal breath sounds.  Abdominal:     Palpations: Abdomen is soft. There is no pulsatile mass.     Tenderness: There is no abdominal tenderness.  Musculoskeletal:     Cervical back: No tenderness (Nontender thyroid, no Nodules.).     Right lower leg: No edema.     Left lower leg: No edema.  Skin:    General: Skin is warm and dry.  Neurological:     Mental Status: She is alert and oriented to person, place, and time.  Psychiatric:        Mood and  Affect: Mood normal.        Behavior: Behavior normal.        Thought Content: Thought content normal.     48 minutes spent during visit, including chart review, prior PCP notes and labs,  counseling and assimilation of information, exam, discussion of plan, and chart completion.    Assessment & Plan:  Beverly Atkinson is a 41 y.o. female . Hypothyroidism due to Hashimoto's thyroiditis - Plan: NP THYROID 120 MG tablet, TSH, CBC, T3, free  -Updated labs ordered.  Noted that her previous TSH was low and recommended higher dosing of NP thyroid last year.  Has remained at 120 mg.  Discussed potential need for endocrine follow-up if abnormal readings or change in regimen needed, and may need to consider levothyroxine but will defer to patient and endocrine depending on results.  Insomnia, unspecified type - Plan: temazepam (RESTORIL) 15 MG capsule  -Longstanding symptoms with multiple failed approaches/meds as above.  Unfortunately has had recurrence of symptoms off Restoril, insufficient to treat with over-the-counter options.  We will try a lower dose Restoril initially at 15 mg, recheck in 1 month to discuss insomnia further  Restless leg syndrome  -Improving on Restoril previously. recheck 1 month  Pain, dental - Plan: CBC  -Continue dental follow-up,  check CBC with borderline temp as reported as above but no true fever, no sign of abscess on the exam in office, no apparent jaw swelling or lymphadenopathy but if any change or worsening symptoms would consider imaging.  ER/RTC precautions discussed.  Meds ordered this encounter  Medications   NP THYROID 120 MG tablet    Sig: Take 1 tablet (120 mg total) by mouth daily before breakfast.    Dispense:  30 tablet    Refill:  3   temazepam (RESTORIL) 15 MG capsule    Sig: Take 1 capsule (15 mg total) by mouth at bedtime as needed for sleep.    Dispense:  30 capsule    Refill:  0   Patient Instructions  I do recommend continuing Zoloft, can  try taking in am.  Can try restoril - 15mg  initially and follow up to discuss sleep in next month. I will check levels for thyroid, but if abnormal would recommend follow up with endocrinology.  If any new or worsening dental or jaw pain be seen right away. If concerns on CBC will let you know. If higher temp be seen.  Return to the clinic or go to the nearest emergency room if any of your symptoms worsen or new symptoms occur.    Insomnia Insomnia is a sleep disorder that makes it difficult to fall asleep or stay asleep. Insomnia can cause fatigue, low energy, difficulty concentrating, mood swings, and poor performance at work or school. There are three different ways to classify insomnia: Difficulty falling asleep. Difficulty staying asleep. Waking up too early in the morning. Any type of insomnia can be long-term (chronic) or short-term (acute). Both are common. Short-term insomnia usually lasts for three months or less. Chronic insomnia occurs at least three times a week for longer than three months. What are the causes? Insomnia may be caused by another condition, situation, or substance, such as: Anxiety. Certain medicines. Gastroesophageal reflux disease (GERD) or other gastrointestinal conditions. Asthma or other breathing conditions. Restless legs syndrome, sleep apnea, or other sleep disorders. Chronic pain. Menopause. Stroke. Abuse of alcohol, tobacco, or illegal drugs. Mental health conditions, such as depression. Caffeine. Neurological disorders, such as Alzheimer's disease. An overactive thyroid (hyperthyroidism). Sometimes, the cause of insomnia may not be known. What increases the risk? Risk factors for insomnia include: Gender. Women are affected more often than men. Age. Insomnia is more common as you get older. Stress. Lack of exercise. Irregular work schedule or working night shifts. Traveling between different time zones. Certain medical and mental health  conditions. What are the signs or symptoms? If you have insomnia, the main symptom is having trouble falling asleep or having trouble staying asleep. This may lead to other symptoms, such as: Feeling fatigued or having low energy. Feeling nervous about going to sleep. Not feeling rested in the morning. Having trouble concentrating. Feeling irritable, anxious, or depressed. How is this diagnosed? This condition may be diagnosed based on: Your symptoms and medical history. Your health care provider may ask about: Your sleep habits. Any medical conditions you have. Your mental health. A physical exam. How is this treated? Treatment for insomnia depends on the cause. Treatment may focus on treating an underlying condition that is causing insomnia. Treatment may also include: Medicines to help you sleep. Counseling or therapy. Lifestyle adjustments to help you sleep better. Follow these instructions at home: Eating and drinking  Limit or avoid alcohol, caffeinated beverages, and cigarettes, especially close to bedtime. These can disrupt your sleep. Do not eat a large meal or eat spicy foods right before bedtime. This can lead to digestive discomfort that can make it hard for you to sleep. Sleep habits  Keep a sleep diary to help you and your health care provider figure out what could be causing your insomnia. Write down: When you sleep. When you wake up during the night. How well you sleep. How rested you feel the next day. Any side effects of medicines you are taking. What you eat and drink. Make your bedroom a dark, comfortable place where it is easy to fall asleep. Put up shades or blackout curtains to block light from outside. Use  a white noise machine to block noise. Keep the temperature cool. Limit screen use before bedtime. This includes: Watching TV. Using your smartphone, tablet, or computer. Stick to a routine that includes going to bed and waking up at the same times  every day and night. This can help you fall asleep faster. Consider making a quiet activity, such as reading, part of your nighttime routine. Try to avoid taking naps during the day so that you sleep better at night. Get out of bed if you are still awake after 15 minutes of trying to sleep. Keep the lights down, but try reading or doing a quiet activity. When you feel sleepy, go back to bed. General instructions Take over-the-counter and prescription medicines only as told by your health care provider. Exercise regularly, as told by your health care provider. Avoid exercise starting several hours before bedtime. Use relaxation techniques to manage stress. Ask your health care provider to suggest some techniques that may work well for you. These may include: Breathing exercises. Routines to release muscle tension. Visualizing peaceful scenes. Make sure that you drive carefully. Avoid driving if you feel very sleepy. Keep all follow-up visits as told by your health care provider. This is important. Contact a health care provider if: You are tired throughout the day. You have trouble in your daily routine due to sleepiness. You continue to have sleep problems, or your sleep problems get worse. Get help right away if: You have serious thoughts about hurting yourself or someone else. If you ever feel like you may hurt yourself or others, or have thoughts about taking your own life, get help right away. You can go to your nearest emergency department or call: Your local emergency services (911 in the [place] A suicide crisis helpline, such as the National Suicide Prevention Lifeline at 7143675517 or 988 in the [place] This is open 24 hours a day. Summary Insomnia is a sleep disorder that makes it difficult to fall asleep or stay asleep. Insomnia can be long-term (chronic) or short-term (acute). Treatment for insomnia depends on the cause. Treatment may focus on treating an underlying condition that  is causing insomnia. Keep a sleep diary to help you and your health care provider figure out what could be causing your insomnia. This information is not intended to replace advice given to you by your health care provider. Make sure you discuss any questions you have with your health care provider. Document Revised: 08/03/2020 Document Reviewed: 11/19/2019 Elsevier Patient Education  2022 Elsevier Inc.     Signed,   Meredith Staggers, MD [facility], [facility] 02/08/21 12:33 PM

## 2021-02-09 LAB — T3, FREE: T3, Free: 3.8 pg/mL (ref 2.3–4.2)

## 2021-02-20 ENCOUNTER — Encounter: Payer: Self-pay | Admitting: Family Medicine

## 2021-02-20 DIAGNOSIS — E063 Autoimmune thyroiditis: Secondary | ICD-10-CM

## 2021-02-20 DIAGNOSIS — E038 Other specified hypothyroidism: Secondary | ICD-10-CM

## 2021-03-01 ENCOUNTER — Other Ambulatory Visit: Payer: Self-pay | Admitting: Family Medicine

## 2021-03-01 DIAGNOSIS — G4700 Insomnia, unspecified: Secondary | ICD-10-CM

## 2021-03-02 NOTE — Telephone Encounter (Signed)
Controlled substance database (PDMP) reviewed. No concerns appreciated.  Last filled on January 18.  Should not yet be due but please clarify how she is taking meds.  Thanks.

## 2021-03-02 NOTE — Telephone Encounter (Signed)
Patient is requesting a refill of the following medications: Requested Prescriptions   Pending Prescriptions Disp Refills   temazepam (RESTORIL) 15 MG capsule 30 capsule 0    Sig: Take 1 capsule (15 mg total) by mouth at bedtime as needed for sleep.    Date of patient request: 03/01/2021 Last office visit: 02/08/2021 Date of last refill: 02/08/2021 Last refill amount: 30 capsules Follow up time period per chart: 03/13/2021

## 2021-03-03 MED ORDER — TEMAZEPAM 15 MG PO CAPS: 15.0000 mg | ORAL_CAPSULE | Freq: Every evening | ORAL | 0 refills | Status: DC | PRN

## 2021-03-03 NOTE — Telephone Encounter (Signed)
Patient states she will have enough medication to last her until the 18th. But her appointment isn't until the 20th so she was seeing if she requesting it a little early just in case she was not going to be able to get the medication.

## 2021-03-03 NOTE — Telephone Encounter (Signed)
No problem, refill ordered.

## 2021-03-13 ENCOUNTER — Encounter: Payer: Self-pay | Admitting: Family Medicine

## 2021-03-13 ENCOUNTER — Ambulatory Visit: Payer: BC Managed Care – PPO | Admitting: Family Medicine

## 2021-03-13 VITALS — BP 128/70 | HR 67 | Temp 98.1°F | Resp 16 | Ht 60.0 in | Wt 185.6 lb

## 2021-03-13 DIAGNOSIS — E063 Autoimmune thyroiditis: Secondary | ICD-10-CM

## 2021-03-13 DIAGNOSIS — G4700 Insomnia, unspecified: Secondary | ICD-10-CM

## 2021-03-13 DIAGNOSIS — E038 Other specified hypothyroidism: Secondary | ICD-10-CM | POA: Diagnosis not present

## 2021-03-13 DIAGNOSIS — Z8679 Personal history of other diseases of the circulatory system: Secondary | ICD-10-CM | POA: Diagnosis not present

## 2021-03-13 DIAGNOSIS — F330 Major depressive disorder, recurrent, mild: Secondary | ICD-10-CM

## 2021-03-13 DIAGNOSIS — R011 Cardiac murmur, unspecified: Secondary | ICD-10-CM | POA: Diagnosis not present

## 2021-03-13 MED ORDER — SERTRALINE HCL 100 MG PO TABS: 100.0000 mg | ORAL_TABLET | Freq: Every day | ORAL | 3 refills | Status: DC

## 2021-03-13 MED ORDER — TEMAZEPAM 15 MG PO CAPS: 15.0000 mg | ORAL_CAPSULE | Freq: Every evening | ORAL | 2 refills | Status: DC | PRN

## 2021-03-13 NOTE — Patient Instructions (Signed)
No change in Restoril dose for now.  Should have a few refills but let me know if refills are needed.  Plan on 49-month follow-up.  I will recheck your thyroid test today.  Let me know if you are unable to get into endocrinology or if we need to look into other office.  I also placed a referral to cardiology to follow-up on the heart murmur and left bundle branch block. Thanks for coming in today and let me know if there are questions

## 2021-03-13 NOTE — Progress Notes (Signed)
Subjective:  Patient ID: Beverly Atkinson, female    DOB: [birth year]  Age: 41 y.o. MRN: [id number]  CC:  Chief Complaint  Patient presents with   Insomnia    Pt has previously had sleep issues reports temazepam is working great reports she has not concerns     HPI Beverly Atkinson presents for   Insomnia Discussed at establish care visit January 18.  No relief with multiple other attempted treatments for insomnia including trazodone, Elavil, Lunesta, Ambien, Seroquel.  She was doing better back on Zoloft last visit.  Anhedonia, depression symptoms had improved at 100 mg dosing.  Initially attempted Restoril 15 mg dosing. Doing well on 15mg  dosing, no parasomnias or sedation during day.   Abnormal TSH: Undetectable/TSH 0.00 at January 18 visit, free T33.8.  Discussed meeting with endocrinology to decide on changes, referral placed. Taking NP thyroid 120mg  at last visit due to history of Hashimoto's thyroiditis. Had been recommended additional 30mg   On meds past year. Has not yet heard back from endocrine.  Taking medication daily.  No new hot or cold intolerance. No new hair or skin changes, but does have some continued hair loss, chronic dry skin, no heart palpitations or new fatigue. No new weight changes.   Hx of LBBB, prior cardiologist in [facility]. Had normal stress test, echo by report. Hx of heart murmur.    History Patient Active Problem List   Diagnosis Date Noted   Constipation 08/21/2019   Hyperlipidemia 07/15/2018   Pain management contract signed 09/27/2017   Opiate dependence (HCC) 09/27/2017   Polycystic ovaries 06/18/2017   History of endometriosis 06/18/2017   Hypothyroidism due to Hashimoto's thyroiditis 06/18/2017   Essential hypertension 06/13/2017   Hashimoto's thyroiditis 03/18/2017   Primary hypothyroidism 03/18/2017   Left bundle branch block 03/18/2017   Chronic low back pain 03/18/2017   Morbid obesity (HCC) 03/18/2017   Abnormal uterine bleeding  (AUB) 08/09/2015   Chronic pelvic pain in female 08/09/2015   Epigastric pain 01/26/2014   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 Authorizing Provider  B Complex Vitamins (VITAMIN B COMPLEX PO) Take by mouth.   Yes [provider]  Calcium Carbonate-Vitamin D 300-100 MG-UNIT CAPS Take by mouth.   Yes [provider]  Multiple Vitamins-Minerals (MULTIVITAMIN PO) Take by mouth.   Yes [provider]  NP THYROID 120 MG tablet Take 1 tablet (120 mg total) by mouth daily before breakfast. 02/08/21  Yes seven, MD  ondansetron (ZOFRAN ODT) 4 MG disintegrating tablet Take 1 tablet (4 mg total) by mouth every 8 (eight) hours as needed for nausea or vomiting. 04/23/19  Yes Hawks, Christy A, FNP  OVER THE COUNTER MEDICATION Take 1 capsule by mouth daily as needed. [employer] Thyroid complex.   Yes [provider]  sertraline (  ZOLOFT) 100 MG tablet TAKE 1 TABLET BY MOUTH  DAILY 06/23/19  Yes Hawks, Christy A, FNP  temazepam (RESTORIL) 15 MG capsule Take 1 capsule (15 mg total) by mouth at bedtime as needed for sleep. 03/03/21  Yes Shade Flood, MD  Turmeric Curcumin 500 MG CAPS Take 1 capsule by mouth daily.   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   Social Determinants of Health   Financial Resource Strain: Not on file  Food Insecurity: Not on file  Transportation Needs: Not on file  Physical Activity: Not on file  Stress: Not on file  Social Connections: Not on file  Intimate Partner Violence: Not on file    Review of Systems Per HPI.  Respiratory: Negative for cough, chest tightness and shortness of breath.   Cardiovascular: Negative for chest pain, palpitations and leg swelling.     Objective:   Vitals:   03/13/21 0856  BP: 128/70  Pulse: 67  Resp: 16  Temp: 98.1 F (36.7 C)  TempSrc: Temporal  SpO2: 98%  Weight: 185 lb 9.6 oz (84.2 kg)  Height: 5' (1.524 m)     Physical Exam Vitals reviewed.  Constitutional:      Appearance: Normal appearance. She is well-developed.  HENT:     Head: Normocephalic and atraumatic.  Eyes:     Conjunctiva/sclera: Conjunctivae normal.     Pupils: Pupils are equal, round, and reactive to light.  Neck:     Vascular: No carotid bruit.  Cardiovascular:     Rate and Rhythm: Normal rate and regular rhythm.     Heart sounds: Murmur (2/6 systolic.) heard.  Pulmonary:     Effort: Pulmonary effort is normal.     Breath sounds: Normal breath sounds.  Abdominal:     Palpations: Abdomen is soft. There is no pulsatile mass.     Tenderness: There is no abdominal tenderness.  Musculoskeletal:     Right lower leg: No edema.     Left lower leg: No edema.  Skin:    General: Skin is warm and dry.  Neurological:     Mental Status: She is  alert and oriented to person, place, and time.  Psychiatric:        Mood and Affect: Mood normal.        Behavior: Behavior normal.       Assessment & Plan:  Beverly Atkinson is a 41 y.o. female . Insomnia, unspecified type - Plan: temazepam (RESTORIL) 15 MG capsule  -Well-controlled on 15 mg dose, continue same.  Prescription given with 2 refills, call for future refills, 25-month follow-up.  Hypothyroidism due to Hashimoto's thyroiditis - Plan: TSH + free T4, T3 Low TSH previously, some chronic care loss, potentially may be overtreated.  Repeat TSH, T4, T3 and can adjust  NP thyroid until seen by endocrinology.  May need to change to levothyroxine but can be discussed with endocrinology.  Heart murmur - Plan: Ambulatory referral to Cardiology History of left bundle branch block (LBBB) - Plan: Ambulatory referral to Cardiology  -New concern, has previously been evaluated by cardiology in [place], apparently reassuring work-up at that time.  Asymptomatic at this time without chest pain, dyspnea, palpitations.  Will refer to local cardiologist for ongoing care Mild episode of recurrent major depressive disorder (HCC) - Plan: sertraline (ZOLOFT) 100 MG tablet   Meds ordered this encounter  Medications   sertraline (ZOLOFT) 100 MG tablet    Sig: Take 1 tablet (100 mg total) by mouth daily.    Dispense:  90 tablet    Refill:  3    Requesting 1 year supply   temazepam (RESTORIL) 15 MG capsule    Sig: Take 1 capsule (15 mg total) by mouth at bedtime as needed for sleep.    Dispense:  30 capsule    Refill:  2   Patient Instructions  No change in Restoril dose for now.  Should have a few refills but let me know if refills are needed.  Plan on 66-month follow-up.  I will recheck your thyroid test today.  Let me know if you are unable to get into endocrinology or if we need to look into other office.  I also placed a referral to cardiology to follow-up on the heart murmur and left bundle  branch block. Thanks for coming in today and let me know if there are questions    Signed,   Meredith Staggers, MD [facility], [facility] 03/13/21 9:27 AM

## 2021-03-14 LAB — T4, FREE: Free T4: 1 ng/dL (ref 0.8–1.8)

## 2021-03-14 LAB — T3: T3, Total: 271 ng/dL — ABNORMAL HIGH (ref 76–181)

## 2021-03-14 LAB — TSH+FREE T4: TSH W/REFLEX TO FT4: 0.01 mIU/L — ABNORMAL LOW

## 2021-03-19 ENCOUNTER — Other Ambulatory Visit: Payer: Self-pay | Admitting: Family Medicine

## 2021-03-19 DIAGNOSIS — E063 Autoimmune thyroiditis: Secondary | ICD-10-CM

## 2021-03-19 DIAGNOSIS — E038 Other specified hypothyroidism: Secondary | ICD-10-CM

## 2021-03-19 MED ORDER — THYROID 90 MG PO TABS: 90.0000 mg | ORAL_TABLET | Freq: Every day | ORAL | 2 refills | Status: DC

## 2021-03-19 NOTE — Progress Notes (Signed)
See labs, decreased NP thyroid to 90 mg.

## 2021-04-24 ENCOUNTER — Ambulatory Visit (HOSPITAL_BASED_OUTPATIENT_CLINIC_OR_DEPARTMENT_OTHER): Payer: BC Managed Care – PPO | Admitting: Cardiology

## 2021-05-23 ENCOUNTER — Encounter (HOSPITAL_BASED_OUTPATIENT_CLINIC_OR_DEPARTMENT_OTHER): Payer: Self-pay | Admitting: Cardiology

## 2021-05-23 ENCOUNTER — Ambulatory Visit (HOSPITAL_BASED_OUTPATIENT_CLINIC_OR_DEPARTMENT_OTHER): Payer: BC Managed Care – PPO | Admitting: Cardiology

## 2021-05-23 VITALS — BP 154/78 | HR 89 | Ht 60.0 in | Wt 187.0 lb

## 2021-05-23 DIAGNOSIS — E8881 Metabolic syndrome: Secondary | ICD-10-CM | POA: Diagnosis not present

## 2021-05-23 DIAGNOSIS — E782 Mixed hyperlipidemia: Secondary | ICD-10-CM | POA: Diagnosis not present

## 2021-05-23 DIAGNOSIS — I447 Left bundle-branch block, unspecified: Secondary | ICD-10-CM | POA: Diagnosis not present

## 2021-05-23 DIAGNOSIS — R7309 Other abnormal glucose: Secondary | ICD-10-CM

## 2021-05-23 DIAGNOSIS — R011 Cardiac murmur, unspecified: Secondary | ICD-10-CM

## 2021-05-23 DIAGNOSIS — Z6836 Body mass index (BMI) 36.0-36.9, adult: Secondary | ICD-10-CM

## 2021-05-23 DIAGNOSIS — E6609 Other obesity due to excess calories: Secondary | ICD-10-CM

## 2021-05-23 MED ORDER — SEMAGLUTIDE-WEIGHT MANAGEMENT 1.7 MG/0.75ML ~~LOC~~ SOAJ: 1.7000 mg | SUBCUTANEOUS | 0 refills | Status: DC

## 2021-05-23 MED ORDER — SEMAGLUTIDE-WEIGHT MANAGEMENT 1 MG/0.5ML ~~LOC~~ SOAJ: 1.0000 mg | SUBCUTANEOUS | 0 refills | Status: AC

## 2021-05-23 MED ORDER — SEMAGLUTIDE(0.25 OR 0.5MG/DOS) 2 MG/1.5ML ~~LOC~~ SOPN: 0.2500 mg | PEN_INJECTOR | SUBCUTANEOUS | 0 refills | Status: DC

## 2021-05-23 MED ORDER — SEMAGLUTIDE-WEIGHT MANAGEMENT 2.4 MG/0.75ML ~~LOC~~ SOAJ: 2.4000 mg | SUBCUTANEOUS | 8 refills | Status: DC

## 2021-05-23 MED ORDER — SEMAGLUTIDE-WEIGHT MANAGEMENT 0.5 MG/0.5ML ~~LOC~~ SOAJ: 0.5000 mg | SUBCUTANEOUS | 0 refills | Status: AC

## 2021-05-23 NOTE — Patient Instructions (Addendum)
Medication Instructions:  ? ?Semaglutide starting dose plan: ?-Start with the 0.25 mg dose every week for 4 weeks, ?-Then use the 0.5 mg dose every week for 4 weeks, ?-Then continue with 1 mg dose every week. We will meet when you are on the 1 mg dose to discuss timing of uptitration. ? ?Some nausea is normal, as is decreased appetite. If you have severe nausea or stomach pain, please contact us and stop the medication.  ? ?Teaching done with demo pen today and samples given today.  ? ?*If you need a refill on your cardiac medications before your next appointment, please call your pharmacy* ? ?Lab Work: ?LP/A1C TODAY   ? ?Testing/Procedures: ?NONE ? ?Follow-Up: ?At [facility], you and your health needs are our priority.  As part of our continuing mission to provide you with exceptional heart care, we have created designated Provider Care Teams.  These Care Teams include your primary Cardiologist (physician) and Advanced Practice Providers (APPs -  Physician Assistants and Nurse Practitioners) who all work together to provide you with the care you need, when you need it. ? ?We recommend signing up for the patient portal called "MyChart".  Sign up information is provided on this After Visit Summary.  MyChart is used to connect with patients for Virtual Visits (Telemedicine).  Patients are able to view lab/test results, encounter notes, upcoming appointments, etc.  Non-urgent messages can be sent to your provider as well.   ?To learn more about what you can do with MyChart, go to [email or web address].   ? ?Your next appointment:   ?3 month(s) ? ?The format for your next appointment:   ?In Person ? ?Provider:   ?Jodelle Red, MD{ ?  ? ?  ? ? ?

## 2021-05-23 NOTE — Progress Notes (Signed)
?Cardiology Office Note:   ? ?Date:  05/23/2021  ? ?ID:  Beverly Atkinson, DOB Jul 12, 1980, MRN [id number] ? ?PCP:  Wendie Agreste, MD  ?Cardiologist:  Buford Dresser, MD ? ?Referring MD: Wendie Agreste, MD  ? ?CC: new patient consultation for LBBB and murmur. ? ?History of Present Illness:   ? ?Beverly Atkinson is a 41 y.o. female with a hx of hypertension, hypothyroidism who is seen as a new consult at the request of Wendie Agreste, MD for the evaluation and management of LBBB and heart murmur. ? ?Note from Dr. Carlota Raspberry dated 03/13/21 reviewed. Noted history of LBBB, prior cardiologist in [place], [place]. Reported normal stress test and echo. ? ?Was on enalapril 10 mg for several years, had a dry cough. Stopped and was not restarted on any medications for about the last three years. Discussed home blood pressure monitoring. Also has chronic pain, noticed spikes with her pain. ? ?Cardiovascular risk factors: ?Prior clinical ASCVD: none ?Comorbid conditions: hypertension, mixed hyperlipidemia. Refuses statins as she does not want to risk muscle aches. Denies diabetes, chronic kidney disease  ?Metabolic syndrome/Obesity: currently at her highest adult weight. ?Chronic inflammatory conditions: Hashimoto's ?Tobacco use history: never ?Family history: father and paternal grandfather with hypertension, HLD. Both smokers/drinkers. Paternal grandfather died of MI or stroke. ?Prior pertinent testing and/or incidental findings: reports MPI and echo normal in [place]. ?Exercise level: not currently active due to back pain, but walks, etc. ?Current diet: watches what she eats, minimizes carbs. ? ?Metabolic syndrome: ?PCOS (offered metformin but did not trial) ?Abdominal adiposity ?BMI 36 ?Hypertension ?Mixed hyperlipidemia/dyslipidemia ? ?Has tried diet and exercise, cannot lose the weight. Has tried intermittent fasting, gluten free diet, and others. Walks when her pain allows her. ? ?Denies chest pain, shortness of breath at  rest or with normal exertion. No PND, orthopnea, LE edema or unexpected weight gain. No syncope or palpitations.  ? ?Past Medical History:  ?Diagnosis Date  ? Arthritis   ? Depression   ? Heart murmur   ? Insomnia   ? Restless leg   ? Thyroid disease   ? ? ?Past Surgical History:  ?Procedure Laterality Date  ? CHOLECYSTECTOMY    ? 2015  ? EYE SURGERY    ? 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 LAPAROSCOPIC ENDOMETRIOSIS FULGURATION  08/23/2015  ? ? ?Current Medications: ?Current Outpatient Medications on File Prior to Visit  ?Medication Sig  ? B Complex Vitamins (VITAMIN B COMPLEX PO) Take by mouth.  ? Calcium Carbonate-Vitamin D 300-100 MG-UNIT CAPS Take by mouth.  ? Multiple Vitamins-Minerals (MULTIVITAMIN PO) Take by mouth.  ? ondansetron (ZOFRAN ODT) 4 MG disintegrating tablet Take 1 tablet (4 mg total) by mouth every 8 (eight) hours as needed for nausea or vomiting.  ? OVER THE COUNTER MEDICATION Take 1 capsule by mouth daily as needed. The [employer] Thyroid complex.  ? sertraline (ZOLOFT) 100 MG tablet Take 1 tablet (100 mg total) by mouth daily.  ? temazepam (RESTORIL) 15 MG capsule Take 1 capsule (15 mg total) by mouth at bedtime as needed for sleep.  ? thyroid (NP THYROID) 90 MG tablet Take 1 tablet (90 mg total) by mouth daily.  ? Turmeric Curcumin 500 MG CAPS Take 1 capsule by mouth daily.  ? ?No current facility-administered medications on file prior to visit.  ?  ? ?Allergies:   Clarithromycin  ? ?Social History  ? ?Tobacco Use  ? Smoking status: Never  ? Smokeless tobacco: Never  ?Vaping Use  ?  Vaping Use: Never used  ?Substance Use Topics  ? Alcohol use: Yes  ?  Comment: occ  ? Drug use: No  ? ? ?Family History: ?family history includes Alcohol abuse in her father and mother; Arthritis in her father and paternal grandmother; Cancer in her father; Drug abuse in her father, mother, and sister; Heart disease in her father; Hyperlipidemia in her father; Hypertension in her father; Mental illness in her mother;  Thyroid disease in her mother. ? ?ROS:   ?Please see the history of present illness.  Additional pertinent ROS: ?Constitutional: Negative for chills, fever, night sweats, unintentional weight loss  ?HENT: Negative for ear pain and hearing loss.   ?Eyes: Negative for loss of vision and eye pain.  ?Respiratory: Negative for cough, sputum, wheezing.   ?Cardiovascular: See HPI. ?Gastrointestinal: Negative for abdominal pain, melena, and hematochezia.  ?Genitourinary: Negative for dysuria and hematuria.  ?Musculoskeletal: Negative for falls and myalgias.  ?Skin: Negative for itching and rash.  ?Neurological: Negative for focal weakness, focal sensory changes and loss of consciousness.  ?Endo/Heme/Allergies: Does not bruise/bleed easily.   ? ? ?EKGs/Labs/Other Studies Reviewed:   ? ?The following studies were reviewed today: ?MPI 11/02/2013: cannot see results ?Echo 10/30/2013: cannot see results ? ?EKG:  EKG is personally reviewed.   ?05/23/21: NSR, LBBB at 89 bpm ? ?Recent Labs: ?02/08/2021: Hemoglobin 13.2; Platelets 352.0; TSH 0.00  ?Recent Lipid Panel ?   ?Component Value Date/Time  ? CHOL 238 (H) 08/21/2019 1118  ? TRIG 213 (H) 08/21/2019 1118  ? HDL 48 08/21/2019 1118  ? CHOLHDL 5.0 (H) 08/21/2019 1118  ? LDLCALC 151 (H) 08/21/2019 1118  ? ? ?Physical Exam:   ? ?VS:  BP (!) 154/78   Pulse 89   Ht 5' (1.524 m)   Wt 187 lb (84.8 kg)   SpO2 99%   BMI 36.52 kg/m?    ? ?Wt Readings from Last 3 Encounters:  ?05/23/21 187 lb (84.8 kg)  ?03/13/21 185 lb 9.6 oz (84.2 kg)  ?02/08/21 185 lb 9.6 oz (84.2 kg)  ?  ?GEN: Well nourished, well developed in no acute distress ?HEENT: Normal, moist mucous membranes ?NECK: No JVD ?CARDIAC: regular rhythm, normal S1 and S2, no rubs or gallops. 1/6 systolic murmur. ?VASCULAR: Radial and DP pulses 2+ bilaterally. No carotid bruits ?RESPIRATORY:  Clear to auscultation without rales, wheezing or rhonchi  ?ABDOMEN: Soft, non-tender, non-distended ?MUSCULOSKELETAL:  Ambulates  independently ?SKIN: Warm and dry, no edema ?NEUROLOGIC:  Alert and oriented x 3. No focal neuro deficits noted. ?PSYCHIATRIC:  Normal affect   ? ?ASSESSMENT:   ? ?1. Metabolic syndrome   ?2. LBBB (left bundle branch block)   ?3. Murmur, cardiac   ?4. Mixed hyperlipidemia   ?5. Class 2 obesity due to excess calories without serious comorbidity with body mass index (BMI) of 36.0 to 36.9 in adult   ?6. Elevated glucose   ? ?PLAN:   ? ?Left bundle branch block: chronic, prior echo/MPI normal per patient ?Murmur: very soft, no high risk features ? ?Metabolic syndrome: ?PCOS ?Abdominal adiposity ?BMI 36 ?Hypertension ?Mixed hyperlipidemia/dyslipidemia ? ?We discussed Wegovy today: ?-Semaglutide starting dose plan: ?-Start with the 0.25 mg dose every week for 4 weeks, ?-Then use the 0.5 mg dose every week for 4 weeks, ?-Then continue with 1 mg dose every week. We will meet when you are on the 1 mg dose to discuss timing of uptitration. ? ?Some nausea is normal, as is decreased appetite. If you have severe nausea or  stomach pain, please contact us and stop the medication.  ? ?Teaching done with demo pen today and samples given today.  ? ?Cardiac risk counseling and prevention recommendations: ?-recommend heart healthy/Mediterranean diet, with whole grains, fruits, vegetable, fish, lean meats, nuts, and olive oil. Limit salt. ?-recommend moderate walking, 3-5 times/week for 30-50 minutes each session. Aim for at least 150 minutes.week. Goal should be pace of 3 miles/hours, or walking 1.5 miles in 30 minutes ?-recommend avoidance of tobacco products. Avoid excess alcohol. ?-ASCVD risk score: ?The 10-year ASCVD risk score (Arnett DK, et al., 2019) is: 1.7% ?  Values used to calculate the score: ?    Age: 26 years ?    Sex: Female ?    Is Non-Hispanic African American: No ?    Diabetic: No ?    Tobacco smoker: No ?    Systolic Blood Pressure: 123456 mmHg ?    Is BP treated: No ?    HDL Cholesterol: 48 mg/dL ?    Total  Cholesterol: 238 mg/dL   ? ?Plan for follow up: 3 mos ? ?Buford Dresser, MD, PhD, [place] ?[place]   ? ?Medication Adjustments/Labs and Tests Ordered: ?Current medicines are reviewed at length with the patient to

## 2021-05-24 DIAGNOSIS — Z6836 Body mass index (BMI) 36.0-36.9, adult: Secondary | ICD-10-CM | POA: Diagnosis not present

## 2021-05-24 DIAGNOSIS — R7309 Other abnormal glucose: Secondary | ICD-10-CM | POA: Diagnosis not present

## 2021-05-24 DIAGNOSIS — E782 Mixed hyperlipidemia: Secondary | ICD-10-CM | POA: Diagnosis not present

## 2021-05-24 DIAGNOSIS — E6609 Other obesity due to excess calories: Secondary | ICD-10-CM | POA: Diagnosis not present

## 2021-05-25 ENCOUNTER — Telehealth (HOSPITAL_BASED_OUTPATIENT_CLINIC_OR_DEPARTMENT_OTHER): Payer: Self-pay | Admitting: *Deleted

## 2021-05-25 LAB — HEMOGLOBIN A1C
Est. average glucose Bld gHb Est-mCnc: 134 mg/dL
Hgb A1c MFr Bld: 6.3 % — ABNORMAL HIGH (ref 4.8–5.6)

## 2021-05-25 LAB — LIPID PANEL
Chol/HDL Ratio: 4.7 ratio — ABNORMAL HIGH (ref 0.0–4.4)
Cholesterol, Total: 207 mg/dL — ABNORMAL HIGH (ref 100–199)
HDL: 44 mg/dL (ref >39)
LDL Chol Calc (NIH): 143 mg/dL — ABNORMAL HIGH (ref 0–99)
Triglycerides: 109 mg/dL (ref 0–149)
VLDL Cholesterol Cal: 20 mg/dL (ref 5–40)

## 2021-05-25 NOTE — Telephone Encounter (Signed)
Ozempic #1 Lot [id number] Exp 03/22/23 ?

## 2021-05-26 ENCOUNTER — Telehealth (HOSPITAL_BASED_OUTPATIENT_CLINIC_OR_DEPARTMENT_OTHER): Payer: Self-pay

## 2021-05-26 NOTE — Telephone Encounter (Addendum)
Called results to patient and left results on VM (ok per DPR), instructions left to call office back if patient has any questions!  ? ? ? ?----- Message from Jodelle Red, MD sent at 05/25/2021  7:55 AM EDT ----- ?Baseline labs prior to starting [facility]; prediabetes and elevated cholesterol. Weight loss should help with both, so we will recheck in the future. ?

## 2021-05-30 ENCOUNTER — Telehealth (HOSPITAL_BASED_OUTPATIENT_CLINIC_OR_DEPARTMENT_OTHER): Payer: Self-pay

## 2021-05-30 ENCOUNTER — Encounter (HOSPITAL_BASED_OUTPATIENT_CLINIC_OR_DEPARTMENT_OTHER): Payer: Self-pay

## 2021-05-30 NOTE — Telephone Encounter (Signed)
Prior authorization for Reginal Lutes has been denied.  ? ? We have started the appeals process. ?  ?

## 2021-06-09 NOTE — Telephone Encounter (Signed)
Pt made aware and verbalized understanding.

## 2021-06-09 NOTE — Telephone Encounter (Signed)
Prior authorization for Beverly Atkinson has been denied due to not being a covered benefit per pt's benefit plan.  Attempted to contact pt to make aware. Left message to call back.

## 2021-07-09 ENCOUNTER — Other Ambulatory Visit: Payer: Self-pay | Admitting: Family Medicine

## 2021-07-09 DIAGNOSIS — G4700 Insomnia, unspecified: Secondary | ICD-10-CM

## 2021-07-10 NOTE — Telephone Encounter (Signed)
Patient is requesting a refill of the following medications: Requested Prescriptions   Pending Prescriptions Disp Refills   temazepam (RESTORIL) 15 MG capsule [Pharmacy Med Name: Temazepam 15 MG Oral Capsule] 30 capsule 0    Sig: TAKE 1 CAPSULE BY MOUTH AT BEDTIME AS NEEDED FOR SLEEP    Date of patient request: 6/18*2023 Last office visit: 03/13/2021 Date of last refill: 03/13/2021 Last refill amount: 30 capsule 2 refills  Follow up time period per chart: 09/11/2021

## 2021-07-11 NOTE — Telephone Encounter (Signed)
Insomnia discussed February 20.  57-month follow-up planned.  Continued on temazepam 15 mg daily.  Controlled substance database reviewed, last filled May 20, previous April 17.  Refill ordered.

## 2021-08-03 ENCOUNTER — Encounter: Payer: Self-pay | Admitting: Family Medicine

## 2021-08-03 DIAGNOSIS — E038 Other specified hypothyroidism: Secondary | ICD-10-CM

## 2021-08-03 MED ORDER — THYROID 90 MG PO TABS: 90.0000 mg | ORAL_TABLET | Freq: Every day | ORAL | 1 refills | Status: DC

## 2021-08-30 ENCOUNTER — Encounter (HOSPITAL_BASED_OUTPATIENT_CLINIC_OR_DEPARTMENT_OTHER): Payer: Self-pay

## 2021-08-30 NOTE — Telephone Encounter (Signed)
Do you still want to follow up with this patient since she has not been taking wegovy?

## 2021-08-30 NOTE — Telephone Encounter (Signed)
We can change to every 2 year for prevention follow up, thanks.

## 2021-09-04 ENCOUNTER — Ambulatory Visit (HOSPITAL_BASED_OUTPATIENT_CLINIC_OR_DEPARTMENT_OTHER): Payer: BC Managed Care – PPO | Admitting: Cardiology

## 2021-09-11 ENCOUNTER — Ambulatory Visit: Payer: BC Managed Care – PPO | Admitting: Family Medicine

## 2021-09-11 ENCOUNTER — Encounter: Payer: Self-pay | Admitting: Family Medicine

## 2021-09-11 VITALS — BP 128/78 | HR 72 | Temp 99.0°F | Resp 16 | Ht 60.0 in | Wt 183.0 lb

## 2021-09-11 DIAGNOSIS — E063 Autoimmune thyroiditis: Secondary | ICD-10-CM

## 2021-09-11 DIAGNOSIS — F330 Major depressive disorder, recurrent, mild: Secondary | ICD-10-CM | POA: Diagnosis not present

## 2021-09-11 DIAGNOSIS — G4700 Insomnia, unspecified: Secondary | ICD-10-CM

## 2021-09-11 DIAGNOSIS — R7303 Prediabetes: Secondary | ICD-10-CM

## 2021-09-11 DIAGNOSIS — E038 Other specified hypothyroidism: Secondary | ICD-10-CM | POA: Diagnosis not present

## 2021-09-11 LAB — HEMOGLOBIN A1C: Hgb A1c MFr Bld: 6 % (ref 4.6–6.5)

## 2021-09-11 MED ORDER — THYROID 90 MG PO TABS: 90.0000 mg | ORAL_TABLET | Freq: Every day | ORAL | 1 refills | Status: DC

## 2021-09-11 MED ORDER — TEMAZEPAM 15 MG PO CAPS: ORAL_CAPSULE | ORAL | 2 refills | Status: DC

## 2021-09-11 NOTE — Progress Notes (Signed)
Subjective:  Patient ID: Beverly Atkinson, female    DOB: Jun 11, 1980  Age: 41 y.o. MRN: [id number]  CC:  Chief Complaint  Patient presents with   Hypothyroidism   Insomnia   Depression    HPI [name] presents for   Hypothyroidism: Lab Results  Component Value Date   TSH 0.00 (L) 02/08/2021  History of Hashimoto's thyroiditis, treated with NP thyroid 120 mg, undetectable TSH at her January visit.  Had been on higher dose of NP thyroid prior, decreased to 90 mg.  In February after elevated T3 with normal T4.  TSH 0.01 at that time.  Currently taking NP thyroid 90 mg daily Appt with endocrinology in December.  Taking medication daily.  No new hot or cold intolerance. No new hair or skin changes, heart palpitations or new fatigue. No new weight changes.   Prediabetes: On wegovy by cardiology. Weight down 4#.  Lab Results  Component Value Date   HGBA1C 6.3 (H) 05/24/2021   Wt Readings from Last 3 Encounters:  09/11/21 183 lb (83 kg)  05/23/21 187 lb (84.8 kg)  03/13/21 185 lb 9.6 oz (84.2 kg)    Depression: With insomnia Multiple previous treatments without relief including trazodone, Elavil, Lunesta, Ambien, Seroquel.  Had restarted Zoloft and anhedonia/depression symptoms improved on 100 mg dosing.  Restoril 15 mg dosing still effective for sleep.  No parasomnias or daytime sedation. Mood is doing well - no new med side effects.  Controlled substance database (PDMP) reviewed. No concerns appreciated. Last filled temazepam 08/10/21.       09/11/2021    8:53 AM 09/11/2021    8:52 AM 03/13/2021    8:58 AM 02/08/2021    9:44 AM 02/02/2020    8:03 AM  Depression screen PHQ 2/9  Decreased Interest 0 0 0 2 1  Down, Depressed, Hopeless 0 0 0 0 1  PHQ - 2 Score 0 0 0 2 2  Altered sleeping 1  0 3 1  Tired, decreased energy 2  1 3 1   Change in appetite 0  0 0 1  Feeling bad or failure about yourself  0  0 0 0  Trouble concentrating 0  0 1 1  Moving slowly or  fidgety/restless 0  0 0 0  Suicidal thoughts 0  0 0 0  PHQ-9 Score 3  1 9 6   Difficult doing work/chores     Somewhat difficult      History Patient Active Problem List   Diagnosis Date Noted   Constipation 08/21/2019   Hyperlipidemia 07/15/2018   Pain management contract signed 09/27/2017   Opiate dependence (HCC) 09/27/2017   Polycystic ovaries 06/18/2017   History of endometriosis 06/18/2017   Hypothyroidism due to Hashimoto's thyroiditis 06/18/2017   Essential hypertension 06/13/2017   Hashimoto's thyroiditis 03/18/2017   Primary hypothyroidism 03/18/2017   LBBB (left bundle branch block) 03/18/2017   Chronic low back pain 03/18/2017   Morbid obesity (HCC) 03/18/2017   Abnormal uterine bleeding (AUB) 08/09/2015   Chronic pelvic pain in female 08/09/2015   Epigastric pain 01/26/2014   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 Authorizing Provider  B Complex Vitamins (VITAMIN B COMPLEX PO) Take by mouth.   Yes [provider]  Calcium Carbonate-Vitamin D 300-100 MG-UNIT CAPS Take by mouth.   Yes [provider]  Multiple Vitamins-Minerals (MULTIVITAMIN PO) Take by mouth.   Yes [provider]  ondansetron (ZOFRAN ODT) 4 MG disintegrating tablet Take 1 tablet (4 mg total) by mouth every 8 (eight) hours as needed for nausea or vomiting. 04/23/19  Yes Hawks, Christy A, FNP  sertraline  (ZOLOFT) 100 MG tablet Take 1 tablet (100 mg total) by mouth daily. 03/13/21  Yes Shade Flood, MD  temazepam (RESTORIL) 15 MG capsule TAKE 1 CAPSULE BY MOUTH AT BEDTIME AS NEEDED FOR SLEEP 07/11/21  Yes Shade Flood, MD  thyroid (NP THYROID) 90 MG tablet Take 1 tablet (90 mg total) by mouth daily. 08/03/21  Yes Shade Flood, MD  OVER THE COUNTER MEDICATION Take 1 capsule by mouth daily as needed. [employer] Thyroid complex. Patient not taking: Reported on 09/11/2021    [provider]  Turmeric Curcumin 500 MG CAPS Take 1 capsule by mouth daily. 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   Social Determinants of Health   Financial Resource Strain: Not on file  Food Insecurity: Not on file  Transportation Needs: Not on file  Physical Activity: Not on file  Stress: Not on file  Social Connections: Not on file  Intimate Partner Violence: Not on file    Review of Systems Per HPI   Objective:   Vitals:   09/11/21 0849  BP: 128/78  Pulse: 72  Resp: 16  Temp: 99 F (37.2 C)  TempSrc: Oral  SpO2: 100%  Weight: 183 lb (83 kg)  Height: 5' (1.524 m)     Physical Exam Vitals reviewed.  Constitutional:      Appearance: Normal appearance. She is well-developed.  HENT:     Head: Normocephalic and atraumatic.  Eyes:      Conjunctiva/sclera: Conjunctivae normal.     Pupils: Pupils are equal, round, and reactive to light.  Neck:     Vascular: No carotid bruit.  Cardiovascular:     Rate and Rhythm: Normal rate and regular rhythm.     Heart sounds: Normal heart sounds.  Pulmonary:     Effort: Pulmonary effort is normal.     Breath sounds: Normal breath sounds.  Abdominal:     Palpations: Abdomen is soft. There is  no pulsatile mass.     Tenderness: There is no abdominal tenderness.  Musculoskeletal:     Right lower leg: No edema.     Left lower leg: No edema.  Skin:    General: Skin is warm and dry.  Neurological:     Mental Status: She is alert and oriented to person, place, and time.  Psychiatric:        Mood and Affect: Mood normal.        Behavior: Behavior normal.        Assessment & Plan:  Nalanie Winiecki is a 41 y.o. female . Hypothyroidism due to Hashimoto's thyroiditis - Plan: thyroid (NP THYROID) 90 MG tablet, Thyroid Panel With TSH  -Uncontrolled by prior TSH, check thyroid panel on lower dose of NP thyroid, continue follow-up as planned with endocrinology next few months.  Medication adjustments in the interim depending on lab results.  Insomnia, unspecified type - Plan: temazepam (RESTORIL) 15 MG capsule  -Stable with Restoril, no concerns on controlled substance database reviewed as above, refill ordered.  Unfortunately no relief with prior medication regimens  Mild episode of recurrent major depressive disorder (HCC)  -Stable on sertraline, continue same.  Prediabetes - Plan: Hemoglobin A1c  -Check updated A1c.  Prior [facility] treatment.  Meds ordered this encounter  Medications   temazepam (RESTORIL) 15 MG capsule    Sig: TAKE 1 CAPSULE BY MOUTH AT BEDTIME AS NEEDED FOR SLEEP    Dispense:  30 capsule    Refill:  2   thyroid (NP THYROID) 90 MG tablet    Sig: Take 1 tablet (90 mg total) by mouth daily.    Dispense:  90 tablet    Refill:  1    Ok to place on hold   There are  no Patient Instructions on file for this visit.    Signed,   Meredith Staggers, MD [facility], [facility] 09/11/21 9:22 AM

## 2021-09-12 LAB — THYROID PANEL WITH TSH
Free Thyroxine Index: 0.8 — ABNORMAL LOW (ref 1.4–3.8)
T3 Uptake: 23 % (ref 22–35)
T4, Total: 3.5 ug/dL — ABNORMAL LOW (ref 5.1–11.9)
TSH: 1.59 mIU/L

## 2021-11-20 ENCOUNTER — Encounter (HOSPITAL_BASED_OUTPATIENT_CLINIC_OR_DEPARTMENT_OTHER): Payer: Self-pay

## 2021-11-24 ENCOUNTER — Ambulatory Visit (HOSPITAL_BASED_OUTPATIENT_CLINIC_OR_DEPARTMENT_OTHER): Payer: BC Managed Care – PPO | Admitting: Cardiology

## 2021-11-24 VITALS — BP 125/79 | HR 81 | Ht 60.0 in | Wt 187.5 lb

## 2021-11-24 DIAGNOSIS — E8881 Metabolic syndrome: Secondary | ICD-10-CM

## 2021-11-24 DIAGNOSIS — I10 Essential (primary) hypertension: Secondary | ICD-10-CM

## 2021-11-24 DIAGNOSIS — I447 Left bundle-branch block, unspecified: Secondary | ICD-10-CM | POA: Diagnosis not present

## 2021-11-24 DIAGNOSIS — E6609 Other obesity due to excess calories: Secondary | ICD-10-CM

## 2021-11-24 DIAGNOSIS — R011 Cardiac murmur, unspecified: Secondary | ICD-10-CM

## 2021-11-24 DIAGNOSIS — Z6836 Body mass index (BMI) 36.0-36.9, adult: Secondary | ICD-10-CM

## 2021-11-24 DIAGNOSIS — E782 Mixed hyperlipidemia: Secondary | ICD-10-CM

## 2021-11-24 MED ORDER — VALSARTAN 80 MG PO TABS: 80.0000 mg | ORAL_TABLET | Freq: Every day | ORAL | 3 refills | Status: DC

## 2021-11-24 NOTE — Patient Instructions (Signed)
Medication Instructions:  Discontinue Enalapril Start Valsartan 80 mg daily  *If you need a refill on your cardiac medications before your next appointment, please call your pharmacy*   Lab Work: None   Testing/Procedures: None   Follow-Up: At [facility], you and your health needs are our priority.  As part of our continuing mission to provide you with exceptional heart care, we have created designated Provider Care Teams.  These Care Teams include your primary Cardiologist (physician) and Advanced Practice Providers (APPs -  Physician Assistants and Nurse Practitioners) who all work together to provide you with the care you need, when you need it.  We recommend signing up for the patient portal called "MyChart".  Sign up information is provided on this After Visit Summary.  MyChart is used to connect with patients for Virtual Visits (Telemedicine).  Patients are able to view lab/test results, encounter notes, upcoming appointments, etc.  Non-urgent messages can be sent to your provider as well.   To learn more about what you can do with MyChart, go to [email or web address].    Your next appointment:   12 month(s)  The format for your next appointment:   In Person  Provider:   Buford Dresser, MD   How to check blood pressure:  -sit comfortably in a chair, feet uncrossed and flat on floor, for 5-10 minutes  -arm ideally should rest at the level of the heart. However, arm should be relaxed and not tense (for example, do not hold the arm up unsupported)  -avoid exercise, caffeine, and tobacco for at least 30 minutes prior to BP reading  -don't take BP cuff reading over clothes (always place on skin directly)  -I prefer to know how well the medication is working, so I would like you to take your readings 1-2 hours after taking your blood pressure medication if possible   I like arm cuffs better than wrist cuffs, especially Omron. If your blood pressure on your  home cuff is running consistently more than 140/90 or less than 100/60 please call me and let me know. I would like Dr. Carlota Raspberry to check your renal function at your visit in about a month; I will send him a note but if you don't have labs done let me know and we will check on.

## 2021-11-24 NOTE — Progress Notes (Signed)
Cardiology Office Note:    Date:  11/24/2021   ID:  Beverly Atkinson, DOB 11/03/1980, MRN [id number]  PCP:  Shade Flood, MD  Cardiologist:  Jodelle Red, MD  Referring MD: Shade Flood, MD   CC: follow up  History of Present Illness:    Beverly Atkinson is a 41 y.o. female with a hx of hypertension, HLD, hypothyroidism, and PCOS who is seen for follow up. She was initially seen by me for consult on 05/23/21 at the request of Shade Flood, MD for the evaluation and management of LBBB and heart murmur.  CV history: history of LBBB, prior cardiologist in [place], [place]. Reported normal stress test and echo.  Cardiovascular risk factors: Prior clinical ASCVD: none Comorbid conditions: hypertension, mixed hyperlipidemia. Refuses statins as she does not want to risk muscle aches. Denies diabetes, chronic kidney disease  Metabolic syndrome/Obesity: currently at her highest adult weight. Chronic inflammatory conditions: Hashimoto's Tobacco use history: never Family history: father and paternal grandfather with hypertension, HLD. Both smokers/drinkers. Paternal grandfather died of MI or stroke. Prior pertinent testing and/or incidental findings: reports MPI and echo normal in [place]. Exercise level: not currently active due to back pain, but walks, etc. Current diet: watches what she eats, minimizes carbs.  Metabolic syndrome: PCOS (offered metformin but did not trial) Abdominal adiposity BMI 36 Hypertension Mixed hyperlipidemia/dyslipidemia  At her last visit with me on 05/23/21, she was prescribed Wegovy as she had tried diet and exercise but was unable to lose the weight. She had also tried intermittent fasting, gluten free diet, and others.   Patient states that she had stopped Enalapril in the past due to a dry cough. She has been taking it again since 10/21. She reports improvement of her blood pressure yesterday but she is has the dry cough again. Her blood pressure has  been elevated at home with an average of 140-160s systolic until yesterday. Today her blood pressure in the clinic is 125/79. She thinks her wrist cuff might be a reason as to why the readings are high. She has been looking into alternative blood pressure machines.    The puffiness in her feet goes away by the morning when she takes off her socks at night.   Next month she has an appointment with Dr. Neva Seat, her PCP.   The patient denies chest pain, chest pressure, dyspnea at rest or with exertion, PND, or orthopnea. Denies fever, chills, nausea, or vomiting. Denies syncope. Denies dizziness or lightheadedness.    Past Medical History:  Diagnosis Date   Arthritis    Depression    Heart murmur    Insomnia    Restless leg    Thyroid disease     Past Surgical History:  Procedure Laterality Date   CHOLECYSTECTOMY     2015   EYE SURGERY     Lazy eye at age 79   LAPAROSCOPIC ENDOMETRIOSIS FULGURATION  08/23/2015    Current Medications: Current Outpatient Medications on File Prior to Visit  Medication Sig   B Complex Vitamins (VITAMIN B COMPLEX PO) Take by mouth.   Calcium Carbonate-Vitamin D 300-100 MG-UNIT CAPS Take by mouth.   Multiple Vitamins-Minerals (MULTIVITAMIN PO) Take by mouth.   ondansetron (ZOFRAN ODT) 4 MG disintegrating tablet Take 1 tablet (4 mg total) by mouth every 8 (eight) hours as needed for nausea or vomiting.   OVER THE COUNTER MEDICATION Take 1 capsule by mouth daily as needed. [employer] Thyroid complex.   sertraline (ZOLOFT) 100  MG tablet Take 1 tablet (100 mg total) by mouth daily.   temazepam (RESTORIL) 15 MG capsule TAKE 1 CAPSULE BY MOUTH AT BEDTIME AS NEEDED FOR SLEEP   thyroid (NP THYROID) 90 MG tablet Take 1 tablet (90 mg total) by mouth daily.   Turmeric Curcumin 500 MG CAPS Take 1 capsule by mouth daily.   No current facility-administered medications on file prior to visit.     Allergies:   Ace inhibitors and Clarithromycin   Social  History   Tobacco Use   Smoking status: Never   Smokeless tobacco: Never  Vaping Use   Vaping Use: Never used  Substance Use Topics   Alcohol use: Yes    Comment: occ   Drug use: No    Family History: family history includes Alcohol abuse in her father and mother; Arthritis in her father and paternal grandmother; Cancer in her father; Drug abuse in her father, mother, and sister; Heart disease in her father; Hyperlipidemia in her father; Hypertension in her father; Mental illness in her mother; Thyroid disease in her mother.  ROS:   Please see the history of present illness.   (+) Dry cough (+) Ankle swelling Additional pertinent ROS negative except as documented.  EKGs/Labs/Other Studies Reviewed:    The following studies were reviewed today: MPI 11-03-2013: cannot see results Echo 10/30/2013: cannot see results  EKG:  EKG is personally reviewed.   05/23/21: NSR, LBBB at 89 bpm  Recent Labs: 02/08/2021: Hemoglobin 13.2; Platelets 352.0 09/11/2021: TSH 1.59  Recent Lipid Panel    Component Value Date/Time   CHOL 207 (H) 05/24/2021 0814   TRIG 109 05/24/2021 0814   HDL 44 05/24/2021 0814   CHOLHDL 4.7 (H) 05/24/2021 0814   LDLCALC 143 (H) 05/24/2021 0814    Physical Exam:    VS:  BP 125/79 (BP Location: Left Arm, Patient Position: Sitting, Cuff Size: Normal)   Pulse 81   Ht 5' (1.524 m)   Wt 187 lb 8 oz (85 kg)   BMI 36.62 kg/m     Wt Readings from Last 3 Encounters:  11/24/21 187 lb 8 oz (85 kg)  09/11/21 183 lb (83 kg)  05/23/21 187 lb (84.8 kg)    GEN: Well nourished, well developed in no acute distress HEENT: Normal, moist mucous membranes NECK: No JVD CARDIAC: regular rhythm, normal S1 and S2, no rubs or gallops. 1/6 systolic murmur. VASCULAR: Radial and DP pulses 2+ bilaterally. No carotid bruits RESPIRATORY:  Clear to auscultation without rales, wheezing or rhonchi  ABDOMEN: Soft, non-tender, non-distended MUSCULOSKELETAL:  Ambulates  independently SKIN: Warm and dry, no edema NEUROLOGIC:  Alert and oriented x 3. No focal neuro deficits noted. PSYCHIATRIC:  Normal affect    ASSESSMENT:    1. Essential hypertension   2. LBBB (left bundle branch block)   3. Murmur, cardiac   4. Metabolic syndrome   5. Mixed hyperlipidemia   6. Class 2 obesity due to excess calories without serious comorbidity with body mass index (BMI) of 36.0 to 36.9 in adult     PLAN:    Hypertension -dry cough on enalapril, will change to valsartan -has upcoming follow up with her PCP to follow up BP  Left bundle branch block: chronic, prior echo/MPI normal per patient Murmur: very soft, no high risk features  Metabolic syndrome: PCOS Abdominal adiposity BMI 36, obesity class 2 Hypertension Mixed hyperlipidemia/dyslipidemia -prescribed Wegovy 05/2021, unfortunately was not a covered benefit on patient's plan -recommend she check to see if  it will be covered after the new year  Cardiac risk counseling and prevention recommendations: -recommend heart healthy/Mediterranean diet, with whole grains, fruits, vegetable, fish, lean meats, nuts, and olive oil. Limit salt. -recommend moderate walking, 3-5 times/week for 30-50 minutes each session. Aim for at least 150 minutes.week. Goal should be pace of 3 miles/hours, or walking 1.5 miles in 30 minutes -recommend avoidance of tobacco products. Avoid excess alcohol. -ASCVD risk score: The 10-year ASCVD risk score (Arnett DK, et al., 2019) is: 1.3%   Values used to calculate the score:     Age: 74 years     Sex: Female     Is Non-Hispanic African American: No     Diabetic: No     Tobacco smoker: No     Systolic Blood Pressure: 125 mmHg     Is BP treated: Yes     HDL Cholesterol: 44 mg/dL     Total Cholesterol: 207 mg/dL    Plan for follow up: 1 year or sooner as needed.   Jodelle Red, MD, PhD, [facility]  [facility]    Medication Adjustments/Labs and Tests  Ordered: Current medicines are reviewed at length with the patient today.  Concerns regarding medicines are outlined above.   No orders of the defined types were placed in this encounter.  Meds ordered this encounter  Medications   valsartan (DIOVAN) 80 MG tablet    Sig: Take 1 tablet (80 mg total) by mouth daily.    Dispense:  90 tablet    Refill:  3   Patient Instructions  Medication Instructions:  Discontinue Enalapril Start Valsartan 80 mg daily  *If you need a refill on your cardiac medications before your next appointment, please call your pharmacy*   Lab Work: None   Testing/Procedures: None   Follow-Up: At [facility] ([place] you and your health needs are our priority.  As part of our continuing mission to provide you with exceptional heart care, we have created designated Provider Care Teams.  These Care Teams include your primary Cardiologist (physician) and Advanced Practice Providers (APPs -  Physician Assistants and Nurse Practitioners) who all work together to provide you with the care you need, when you need it.  We recommend signing up for the patient portal called "MyChart".  Sign up information is provided on this After Visit Summary.  MyChart is used to connect with patients for Virtual Visits (Telemedicine).  Patients are able to view lab/test results, encounter notes, upcoming appointments, etc.  Non-urgent messages can be sent to your provider as well.   To learn more about what you can do with MyChart, go to [email or web address].    Your next appointment:   12 month(s)  The format for your next appointment:   In Person  Provider:   Jodelle Red, MD   How to check blood pressure:  -sit comfortably in a chair, feet uncrossed and flat on floor, for 5-10 minutes  -arm ideally should rest at the level of the heart. However, arm should be relaxed and not tense (for example, do not hold the arm up unsupported)  -avoid exercise, caffeine,  and tobacco for at least 30 minutes prior to BP reading  -don't take BP cuff reading over clothes (always place on skin directly)  -I prefer to know how well the medication is working, so I would like you to take your readings 1-2 hours after taking your blood pressure medication if possible   I like arm cuffs better than wrist  cuffs, especially Omron. If your blood pressure on your home cuff is running consistently more than 140/90 or less than 100/60 please call me and let me know. I would like Dr. Carlota Raspberry to check your renal function at your visit in about a month; I will send him a note but if you don't have labs done let me know and we will check on.   I,Alexis Herring,acting as a Education administrator for [employer], MD.,have documented all relevant documentation on the behalf of Buford Dresser, MD,as directed by  Buford Dresser, MD while in the presence of Buford Dresser, MD.  I, Buford Dresser, MD, have reviewed all documentation for this visit. The documentation on 02/21/22 for the exam, diagnosis, procedures, and orders are all accurate and complete.   Signed, Buford Dresser, MD PhD 11/24/2021     [place]

## 2021-12-11 ENCOUNTER — Encounter: Payer: Self-pay | Admitting: Family Medicine

## 2021-12-12 ENCOUNTER — Other Ambulatory Visit: Payer: Self-pay

## 2021-12-12 MED ORDER — TEMAZEPAM 15 MG PO CAPS: 15.0000 mg | ORAL_CAPSULE | Freq: Every evening | ORAL | 0 refills | Status: DC | PRN

## 2021-12-12 NOTE — Telephone Encounter (Signed)
Office visit in August.  Restoril discussed at that time, 15 mg effective for sleep.  Controlled substance database reviewed, last prescription filled 11/12/2021.  No concerns.  Refill ordered.

## 2021-12-12 NOTE — Telephone Encounter (Signed)
Temazepam 15 mg LOV: 09/11/21 Last Refill:09/11/21 Upcoming appt: none

## 2022-01-01 ENCOUNTER — Encounter: Payer: Self-pay | Admitting: Internal Medicine

## 2022-01-01 ENCOUNTER — Ambulatory Visit: Payer: BC Managed Care – PPO | Admitting: Internal Medicine

## 2022-01-01 VITALS — BP 122/74 | HR 88 | Ht 60.0 in | Wt 191.0 lb

## 2022-01-01 DIAGNOSIS — E063 Autoimmune thyroiditis: Secondary | ICD-10-CM

## 2022-01-01 DIAGNOSIS — E038 Other specified hypothyroidism: Secondary | ICD-10-CM | POA: Diagnosis not present

## 2022-01-01 DIAGNOSIS — E282 Polycystic ovarian syndrome: Secondary | ICD-10-CM

## 2022-01-01 LAB — TSH: TSH: 2.46 u[IU]/mL (ref 0.35–5.50)

## 2022-01-01 NOTE — Progress Notes (Unsigned)
Name: Beverly Atkinson  MRN/ DOB: [id number], 12-11-1980    Age/ Sex: 41 y.o., female    PCP: Shade Flood, MD   Reason for Endocrinology Evaluation: Hypothyroidism     Date of Initial Endocrinology Evaluation: 01/01/2022     HPI: [name] is a 41 y.o. female with a past medical history of Hypothyroidism . The patient presented for initial endocrinology clinic visit on 01/01/2022 for consultative assistance with her Hypothyroidism   She was diagnosed with hypothyroidism  at ~ age 65. She was on Synthroid but due to insurance coverage , this was switched to levothyroxine but her TSH was difficult to control    She was started on NP thyroid through [place] ~ a couple of  years    Patient has been noted with low TSH since 2019 with a nadir of 0.00 uIU/mL in January 2023   Weight stable  Has noted local neck swelling  Denies XRT  She is on Thyroid  Complex that she takes every other day and it makes her feel better    Mother and paternal grandmother with thyroid disease    Of note the patient is on NP thyroid 90 mg daily    Of note, the pt was diagnosed with PCOS in the past. She has tried multiple COC's without much help . She has been focusing on diet and life style changes .  She follows with GYN.  She gets menstruations monthly (more every 3 weeks) She has also been diagnosed with pre-diabetes , last A1c 6.0 %   She started red rice yeast . Tg has been normal in the past with slight elevated of LDL at 143 mg.dL   HISTORY:  Past Medical History:  Past Medical History:  Diagnosis Date   Arthritis    Depression    Heart murmur    Insomnia    Restless leg    Thyroid disease    Past Surgical History:  Past Surgical History:  Procedure Laterality Date   CHOLECYSTECTOMY     2015   EYE SURGERY     Lazy eye at age 9   LAPAROSCOPIC ENDOMETRIOSIS FULGURATION  08/23/2015    Social History:  reports that she has never smoked. She has never used  smokeless tobacco. She reports current alcohol use. She reports that she does not use drugs. Family History: family history includes Alcohol abuse in her father and mother; Arthritis in her father and paternal grandmother; Cancer in her father; Drug abuse in her father, mother, and sister; Heart disease in her father; Hyperlipidemia in her father; Hypertension in her father; Mental illness in her mother; Thyroid disease in her mother.   HOME MEDICATIONS: Allergies as of 01/01/2022       Reactions   Ace Inhibitors Cough   Clarithromycin Rash        Medication List        Accurate as of January 01, 2022  2:13 PM. If you have any questions, ask your nurse or doctor.          STOP taking these medications    OVER THE COUNTER MEDICATION Stopped by: Scarlette Shorts, MD       TAKE these medications    Calcium Carbonate-Vitamin D 300-100 MG-UNIT Caps Take by mouth.   MULTIVITAMIN PO Take by mouth.   NON FORMULARY   ondansetron 4 MG disintegrating tablet Commonly known as: Zofran ODT Take 1 tablet (4 mg total) by mouth every 8 (eight) hours  as needed for nausea or vomiting.   OVER THE COUNTER MEDICATION Take 1 capsule by mouth daily as needed. [employer] Thyroid complex.   sertraline 100 MG tablet Commonly known as: ZOLOFT Take 1 tablet (100 mg total) by mouth daily.   temazepam 15 MG capsule Commonly known as: RESTORIL Take 1 capsule (15 mg total) by mouth at bedtime as needed for sleep. What changed: Another medication with the same name was removed. Continue taking this medication, and follow the directions you see here. Changed by: Scarlette Shorts, MD   thyroid 90 MG tablet Commonly known as: NP Thyroid Take 1 tablet (90 mg total) by mouth daily.   Turmeric Curcumin 500 MG Caps Take 1 capsule by mouth daily.   valsartan 80 MG tablet Commonly known as: DIOVAN Take 1 tablet (80 mg total) by mouth daily.   VITAMIN B COMPLEX PO Take by  mouth.          REVIEW OF SYSTEMS: A comprehensive ROS was conducted with the patient and is negative except as per HPI and below:  ROS     OBJECTIVE:  VS: BP 122/74 (BP Location: Left Arm, Patient Position: Sitting, Cuff Size: Large)   Pulse 88   Ht 5' (1.524 m)   Wt 191 lb (86.6 kg)   SpO2 98%   BMI 37.30 kg/m    Wt Readings from Last 3 Encounters:  01/01/22 191 lb (86.6 kg)  11/24/21 187 lb 8 oz (85 kg)  09/11/21 183 lb (83 kg)     EXAM: General: Pt appears well and is in NAD  Eyes: External eye exam normal without stare, lid lag or exophthalmos.  EOM intact.  PERRL.  Neck: General: Supple without adenopathy. Thyroid: Thyroid size normal.  No goiter or nodules appreciated. No thyroid bruit.  Lungs: Clear with good BS bilat with no rales, rhonchi, or wheezes  Heart: Auscultation: RRR.  Abdomen: Normoactive bowel sounds, soft, nontender, without masses or organomegaly palpable  Extremities:  BL LE: No pretibial edema normal ROM and strength.  Mental Status: Judgment, insight: Intact Orientation: Oriented to time, place, and person Mood and affect: No depression, anxiety, or agitation     DATA REVIEWED: ***    Latest Reference Range & Units 09/11/21 09:26  TSH mIU/L 1.59  Thyroxine (T4) 5.1 - 11.9 mcg/dL 3.5 (L)  Free Thyroxine Index 1.4 - 3.8  0.8 (L)  T3 Uptake 22 - 35 % 23    Latest Reference Range & Units Most Recent  Thyroperoxidase Ab SerPl-aCnc 0 - 34 IU/mL 120 (H) 03/18/17 10:16     ASSESSMENT/PLAN/RECOMMENDATIONS:   Hashimoto's thyroiditis :  -Patient is clinically euthyroid -Intermittent local neck swelling -I explained to the patient that Hashimoto's Disease is an autoimmune - mediated destruction of the thyroid gland. The usual course of Hashimoto's thyroiditis is the gradual loss of thyroid function.  -We discussed there is no clinical benefit from serial monitoring of anti-TPO antibodies -She is on thyroid complex supplementation, I did  ask her to make sure there is no I done in the supplements  -I also discussed with the patient given that she is on NP thyroid which contains T4/T3 will be difficult to optimize those levels and the most important level to monitor is TSH   Medications : NP thyroid 90 mg daily  2. PCOS :  -She has tried multiple COC's without benefit, she follows with gynecology -I have recommended low-carb diet -A1c has been improving  Signed electronically by: Lyndle Herrlich, MD  [facility]  [facility] [address] [place], [place] [address] Phone: [phone] FAX: [phone]   CC: Shade Flood, MD [address] [place] [address] Phone: [phone] Fax: [phone]   Return to Endocrinology clinic as below: Future Appointments  Date Time Provider Department Center  03/14/2022 11:00 AM Shade Flood, MD [id number]

## 2022-01-02 MED ORDER — THYROID 90 MG PO TABS: 90.0000 mg | ORAL_TABLET | Freq: Every day | ORAL | 3 refills | Status: DC

## 2022-01-09 ENCOUNTER — Other Ambulatory Visit: Payer: Self-pay | Admitting: Family Medicine

## 2022-01-09 NOTE — Telephone Encounter (Signed)
Temazepam 15 mg LOV: 09/11/21 Last Refill:12/12/21 Upcoming appt: 03/14/22

## 2022-01-09 NOTE — Telephone Encounter (Signed)
Med discussed at 8/21 OV.  Controlled substance database (PDMP) reviewed. No concerns appreciated. Last filled 12/12/21. Refill ordered.

## 2022-02-08 ENCOUNTER — Other Ambulatory Visit: Payer: Self-pay | Admitting: Family Medicine

## 2022-02-09 NOTE — Telephone Encounter (Signed)
Medication discussed at August 21 visit.  Multiple different attempts with other meds for sleep, ultimately Restoril has been most effective.  Continued on same dosing.  Controlled substance database reviewed.  Last filled for #30 on 01/09/2022, refill ordered.

## 2022-02-09 NOTE — Telephone Encounter (Signed)
Temazepam 15 mg LOV: 09/11/21 Last Refill:01/09/22 Upcoming appt: none

## 2022-02-21 ENCOUNTER — Encounter (HOSPITAL_BASED_OUTPATIENT_CLINIC_OR_DEPARTMENT_OTHER): Payer: Self-pay | Admitting: Cardiology

## 2022-03-14 ENCOUNTER — Encounter: Payer: BC Managed Care – PPO | Admitting: Family Medicine

## 2022-03-15 ENCOUNTER — Encounter: Payer: Self-pay | Admitting: Family Medicine

## 2022-03-15 ENCOUNTER — Ambulatory Visit (INDEPENDENT_AMBULATORY_CARE_PROVIDER_SITE_OTHER): Payer: BC Managed Care – PPO | Admitting: Family Medicine

## 2022-03-15 VITALS — BP 126/68 | HR 77 | Ht 60.0 in | Wt 192.0 lb

## 2022-03-15 DIAGNOSIS — Z0000 Encounter for general adult medical examination without abnormal findings: Secondary | ICD-10-CM | POA: Diagnosis not present

## 2022-03-15 DIAGNOSIS — G4700 Insomnia, unspecified: Secondary | ICD-10-CM

## 2022-03-15 DIAGNOSIS — N809 Endometriosis, unspecified: Secondary | ICD-10-CM | POA: Diagnosis not present

## 2022-03-15 DIAGNOSIS — R7303 Prediabetes: Secondary | ICD-10-CM | POA: Diagnosis not present

## 2022-03-15 DIAGNOSIS — F330 Major depressive disorder, recurrent, mild: Secondary | ICD-10-CM

## 2022-03-15 DIAGNOSIS — E039 Hypothyroidism, unspecified: Secondary | ICD-10-CM | POA: Diagnosis not present

## 2022-03-15 DIAGNOSIS — I10 Essential (primary) hypertension: Secondary | ICD-10-CM | POA: Diagnosis not present

## 2022-03-15 MED ORDER — TEMAZEPAM 15 MG PO CAPS: ORAL_CAPSULE | ORAL | 0 refills | Status: DC

## 2022-03-15 MED ORDER — SERTRALINE HCL 100 MG PO TABS: 100.0000 mg | ORAL_TABLET | Freq: Every day | ORAL | 3 refills | Status: DC

## 2022-03-15 MED ORDER — TRAMADOL HCL 50 MG PO TABS: 50.0000 mg | ORAL_TABLET | Freq: Three times a day (TID) | ORAL | 0 refills | Status: AC | PRN

## 2022-03-15 NOTE — Patient Instructions (Addendum)
I will write a temporary dose of tramadol for endometriosis pain until plan can be discussed with OBGYN. Do not combine with restoril.  Flu vaccine when you can, and covid booster 3 months after infection. Cut back on sodas - I will check labs today. Thanks for coming in today and take care!  Preventive Care 56-44 Years Old, Female Preventive care refers to lifestyle choices and visits with your health care provider that can promote health and wellness. Preventive care visits are also called wellness exams. What can I expect for my preventive care visit? Counseling Your health care provider may ask you questions about your: Medical history, including: Past medical problems. Family medical history. Pregnancy history. Current health, including: Menstrual cycle. Method of birth control. Emotional well-being. Home life and relationship well-being. Sexual activity and sexual health. Lifestyle, including: Alcohol, nicotine or tobacco, and drug use. Access to firearms. Diet, exercise, and sleep habits. Work and work Statistician. Sunscreen use. Safety issues such as seatbelt and bike helmet use. Physical exam Your health care provider will check your: Height and weight. These may be used to calculate your BMI (body mass index). BMI is a measurement that tells if you are at a healthy weight. Waist circumference. This measures the distance around your waistline. This measurement also tells if you are at a healthy weight and may help predict your risk of certain diseases, such as type 2 diabetes and high blood pressure. Heart rate and blood pressure. Body temperature. Skin for abnormal spots. What immunizations do I need?  Vaccines are usually given at various ages, according to a schedule. Your health care provider will recommend vaccines for you based on your age, medical history, and lifestyle or other factors, such as travel or where you work. What tests do I need? Screening Your  health care provider may recommend screening tests for certain conditions. This may include: Lipid and cholesterol levels. Diabetes screening. This is done by checking your blood sugar (glucose) after you have not eaten for a while (fasting). Pelvic exam and Pap test. Hepatitis B test. Hepatitis C test. HIV (human immunodeficiency virus) test. STI (sexually transmitted infection) testing, if you are at risk. Lung cancer screening. Colorectal cancer screening. Mammogram. Talk with your health care provider about when you should start having regular mammograms. This may depend on whether you have a family history of breast cancer. BRCA-related cancer screening. This may be done if you have a family history of breast, ovarian, tubal, or peritoneal cancers. Bone density scan. This is done to screen for osteoporosis. Talk with your health care provider about your test results, treatment options, and if necessary, the need for more tests. Follow these instructions at home: Eating and drinking  Eat a diet that includes fresh fruits and vegetables, whole grains, lean protein, and low-fat dairy products. Take vitamin and mineral supplements as recommended by your health care provider. Do not drink alcohol if: Your health care provider tells you not to drink. You are pregnant, may be pregnant, or are planning to become pregnant. If you drink alcohol: Limit how much you have to 0-1 drink a day. Know how much alcohol is in your drink. In the [place] one drink equals one 12 oz bottle of beer (355 mL), one 5 oz glass of wine (148 mL), or one 1 oz glass of hard liquor (44 mL). Lifestyle Brush your teeth every morning and night with fluoride toothpaste. Floss one time each day. Exercise for at least 30 minutes 5 or more days  each week. Do not use any products that contain nicotine or tobacco. These products include cigarettes, chewing tobacco, and vaping devices, such as e-cigarettes. If you need help  quitting, ask your health care provider. Do not use drugs. If you are sexually active, practice safe sex. Use a condom or other form of protection to prevent STIs. If you do not wish to become pregnant, use a form of birth control. If you plan to become pregnant, see your health care provider for a prepregnancy visit. Take aspirin only as told by your health care provider. Make sure that you understand how much to take and what form to take. Work with your health care provider to find out whether it is safe and beneficial for you to take aspirin daily. Find healthy ways to manage stress, such as: Meditation, yoga, or listening to music. Journaling. Talking to a trusted person. Spending time with friends and family. Minimize exposure to UV radiation to reduce your risk of skin cancer. Safety Always wear your seat belt while driving or riding in a vehicle. Do not drive: If you have been drinking alcohol. Do not ride with someone who has been drinking. When you are tired or distracted. While texting. If you have been using any mind-altering substances or drugs. Wear a helmet and other protective equipment during sports activities. If you have firearms in your house, make sure you follow all gun safety procedures. Seek help if you have been physically or sexually abused. What's next? Visit your health care provider once a year for an annual wellness visit. Ask your health care provider how often you should have your eyes and teeth checked. Stay up to date on all vaccines. This information is not intended to replace advice given to you by your health care provider. Make sure you discuss any questions you have with your health care provider. Document Revised: 07/06/2020 Document Reviewed: 07/06/2020 Elsevier Patient Education  [place].

## 2022-03-15 NOTE — Progress Notes (Signed)
Subjective:  Patient ID: Beverly Atkinson, female    DOB: 1980-06-22  Age: 42 y.o. MRN: [id number]  CC:  Chief Complaint  Patient presents with   Annual Exam    Pt is fasting     HPI [facility] presents for Annual Exam  Endocrinology Dr.Shamleffer, Hashimoto's thyroiditis, PCOS.  Office visit in December.  Cardiology, Dr. Harrell Gave, hypertension, murmur, left bundle branch block.  Office visit in November. Recently started on valsartan GYN: Dr. Lynnette Caffey, at [facility], hx of pcos, adenomyosis, endometriosis. Pain off and on. Prior rx from PCP for tramadol when pain flared. Not daily. No side effects. Plans on appt with gyn. Intermittent use. On menses since last night. Understands not to take with restoril.   Hypertension: Valsartan '80mg'$  qd. Last ate 5 hrs ago.  Home readings: BP Readings from Last 3 Encounters:  03/15/22 126/68  01/01/22 122/74  11/24/21 125/79   Lab Results  Component Value Date   CREATININE 0.73 04/20/2020    Depression with insomnia Multiple previous attempts at treatment including trazodone, Elavil, Lunesta, Ambien, Seroquel.  Treated with Zoloft 100 mg daily with some improvement on previous anhedonia, depression symptoms were discussed in August last year, Restoril has been effective for sleep, 15 mg without new parasomnias or daytime sedation.Controlled substance database reviewed.  Last filled for #30 on 02/09/2022, previously 01/09/2022. Sertraline working well.       03/15/2022    2:17 PM 09/11/2021    8:53 AM 09/11/2021    8:52 AM 03/13/2021    8:58 AM 02/08/2021    9:44 AM  Depression screen PHQ 2/9  Decreased Interest 0 0 0 0 2  Down, Depressed, Hopeless 0 0 0 0 0  PHQ - 2 Score 0 0 0 0 2  Altered sleeping 1 1  0 3  Tired, decreased energy '1 2  1 3  '$ Change in appetite 0 0  0 0  Feeling bad or failure about yourself  0 0  0 0  Trouble concentrating 0 0  0 1  Moving slowly or fidgety/restless 0 0  0 0  Suicidal thoughts 0 0  0 0   PHQ-9 Score '2 3  1 9   '$ Prediabetes: Previously treated with semaglutide - on for about 4 weeks, then denied by insurance.  Min exercise, some sodas - 2-4 per day.   Lab Results  Component Value Date   HGBA1C 6.0 09/11/2021   Wt Readings from Last 3 Encounters:  03/15/22 192 lb (87.1 kg)  01/01/22 191 lb (86.6 kg)  11/24/21 187 lb 8 oz (85 kg)     Health Maintenance  Topic Date Due   COVID-19 Vaccine (4 - 2023-24 season) 03/31/2022 (Originally 09/22/2021)   INFLUENZA VACCINE  04/23/2022 (Originally 08/22/2021)   PAP SMEAR-Modifier  08/06/2022   DTaP/Tdap/Td (3 - Td or Tdap) 04/07/2025   Hepatitis C Screening  Completed   HIV Screening  Completed   HPV VACCINES  Aged Out  Followed by GYN Mammogram less than 2 years ago - at [facility].  No FH of colon CA.  Colonoscopy in 2015 for abdominal pain - was told was ok.   Immunization History  Administered Date(s) Administered   Hepatitis B 09/24/2000   Influenza-Unspecified 10/23/2010, 10/26/2015   Moderna SARS-COV2 Booster Vaccination 02/26/2020   Moderna Sars-Covid-2 Vaccination 02/11/2019, 03/11/2019   Tdap 09/24/2000, 04/08/2015  Covid infection end of January. Plans on booster at 3 months.  Flu vaccine - declines.   No results found. Optho -  yearly wears glasses.   Dental: every 6 months  Alcohol: less than once per week  Tobacco: none  Exercise: some exercise at home, 1 day per week other exercise.    History Patient Active Problem List   Diagnosis Date Noted   Constipation 08/21/2019   Hyperlipidemia 07/15/2018   Pain management contract signed 09/27/2017   Opiate dependence ([place]) 09/27/2017   Polycystic ovaries 06/18/2017   History of endometriosis 06/18/2017   Hypothyroidism due to Hashimoto's thyroiditis 06/18/2017   Essential hypertension 06/13/2017   Hashimoto's thyroiditis 03/18/2017   Primary hypothyroidism 03/18/2017   LBBB (left bundle branch block) 03/18/2017   Chronic low back pain 03/18/2017    Morbid obesity ([place]) 03/18/2017   Abnormal uterine bleeding (AUB) 08/09/2015   Chronic pelvic pain in female 08/09/2015   Epigastric pain 01/26/2014   GS ([facility]) 01/26/2014   Endometriosis 09/24/2013   Insomnia 09/24/2013   Restless leg syndrome 09/24/2013   Depression 09/24/2013   Murmur, cardiac 09/24/2013   Past Medical History:  Diagnosis Date   Arthritis    Depression    Heart murmur    Insomnia    Restless leg    Thyroid disease    Past Surgical History:  Procedure Laterality Date   CHOLECYSTECTOMY     2015   EYE SURGERY     Lazy eye at age 58   LAPAROSCOPIC ENDOMETRIOSIS FULGURATION  08/23/2015   Allergies  Allergen Reactions   Ace Inhibitors Cough   Clarithromycin Rash   Prior to Admission medications   Medication Sig Start Date End Date Taking? Authorizing Provider  B Complex Vitamins (VITAMIN B COMPLEX PO) Take by mouth.   Yes [provider]  Calcium Carbonate-Vitamin D 300-100 MG-UNIT CAPS Take by mouth.   Yes [provider]  Multiple Vitamins-Minerals (MULTIVITAMIN PO) Take by mouth.   Yes [provider]  NON FORMULARY    Yes [provider]  ondansetron (ZOFRAN ODT) 4 MG disintegrating tablet Take 1 tablet (4 mg total) by mouth every 8 (eight) hours as needed for nausea or vomiting. 04/23/19  Yes Hawks, Christy A, FNP  OVER THE COUNTER MEDICATION Take 1 capsule by mouth daily as needed. [employer] Thyroid complex.   Yes [provider]  sertraline (ZOLOFT) 100 MG tablet Take 1 tablet (100 mg total) by mouth daily. 03/13/21  Yes Wendie Agreste, MD  temazepam (RESTORIL) 15 MG capsule TAKE 1 CAPSULE BY MOUTH AT BEDTIME AS NEEDED FOR SLEEP 02/09/22  Yes Wendie Agreste, MD  thyroid (NP THYROID) 90 MG tablet Take 1 tablet (90 mg total) by mouth daily. 01/02/22  Yes Shamleffer, Melanie Crazier, MD  Turmeric Curcumin 500 MG CAPS Take 1 capsule by mouth daily.   Yes [provider]  valsartan  (DIOVAN) 80 MG tablet Take 1 tablet (80 mg total) by mouth daily. 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   Social Determinants of Health   Financial Resource Strain: Not on file  Food Insecurity: Not on file  Transportation Needs: Not on file  Physical Activity: Not on file  Stress: Not on file  Social Connections: Not on file  Intimate Partner Violence: Not on file    Review of Systems 13 point review of systems per patient health survey noted.  Negative other than as indicated above or in HPI.    Objective:   Vitals:   03/15/22 1414  BP: 126/68  Pulse: 77  SpO2: 99%  Weight: 192 lb (87.1 kg)  Height: 5' (1.524 m)     Physical Exam Constitutional:      Appearance: She is well-developed.  HENT:     Head: Normocephalic and atraumatic.     Right Ear: External ear normal.     Left Ear: External ear normal.  Eyes:     Conjunctiva/sclera: Conjunctivae normal.     Pupils: Pupils are equal, round, and reactive to light.  Neck:     Thyroid: No thyromegaly.  Cardiovascular:     Rate and Rhythm: Normal rate and regular rhythm.     Heart sounds: Normal heart sounds. No murmur heard. Pulmonary:     Effort: Pulmonary effort is normal. No respiratory distress.     Breath sounds: Normal breath sounds. No wheezing.  Abdominal:     General: Bowel sounds are normal. There is no distension.      Palpations: Abdomen is soft.     Tenderness: There is abdominal tenderness (diffuse, lower abdomen, no rebound/guarding.).  Musculoskeletal:        General: No tenderness. Normal range of motion.     Cervical back: Normal range of motion and neck supple.  Lymphadenopathy:     Cervical: No cervical adenopathy.  Skin:    General: Skin is warm and dry.     Findings: No rash.  Neurological:     Mental Status: She is alert and oriented to person, place, and time.  Psychiatric:        Behavior: Behavior normal.        Thought Content: Thought content normal.     Assessment & Plan:  Lynsay Cholewa is a 42 y.o. female . Encounter for physical examination - Plan: CBC with Differential/Platelet, Comprehensive metabolic panel, Lipid panel  - -anticipatory guidance as below in AVS, screening labs above. Health maintenance items as above in HPI discussed/recommended as applicable.   Primary hypothyroidism  -Continue follow-up with endocrinology.  Essential hypertension - Plan: CBC with Differential/Platelet, Comprehensive metabolic panel, Lipid panel  -Stable, check labs, no med changes for now.  Follow-up with cardiology as planned.  Endometriosis - Plan: CBC with Differential/Platelet, traMADol (ULTRAM) 50 MG tablet  -New concern, with some current pain, followed by GYN but I will temporarily prescribe tramadol as needed for breakthrough pain with endometriosis, recommended she discuss this med or other approaches with GYN at next visit.  Mild episode of recurrent major depressive disorder ([place]) - Plan: sertraline (ZOLOFT) 100 MG tablet  -Overall stable, continue same dose sertraline.  Prediabetes - Plan: Hemoglobin A1c  -Diet/exercise approach, check updated A1c.  Cut back on sodas.  Insomnia, unspecified type - Plan: temazepam (RESTORIL) 15 MG capsule  -Stable, risks/benefits of this Med have been discussed, as well as previous alternatives.  Continue same dose Restoril.  Meds  ordered this  encounter  Medications   traMADol (ULTRAM) 50 MG tablet    Sig: Take 1 tablet (50 mg total) by mouth every 8 (eight) hours as needed for up to 5 days.    Dispense:  15 tablet    Refill:  0   temazepam (RESTORIL) 15 MG capsule    Sig: TAKE 1 CAPSULE BY MOUTH AT BEDTIME AS NEEDED FOR SLEEP    Dispense:  30 capsule    Refill:  0   sertraline (ZOLOFT) 100 MG tablet    Sig: Take 1 tablet (100 mg total) by mouth daily.    Dispense:  90 tablet    Refill:  3    Requesting 1 year supply   Patient Instructions  I will write a temporary dose of tramadol for endometriosis pain until plan can be discussed with OBGYN. Do not combine with restoril.  Flu vaccine when you can, and covid booster 3 months after infection. Cut back on sodas - I will check labs today. Thanks for coming in today and take care!  Preventive Care 48-17 Years Old, Female Preventive care refers to lifestyle choices and visits with your health care provider that can promote health and wellness. Preventive care visits are also called wellness exams. What can I expect for my preventive care visit? Counseling Your health care provider may ask you questions about your: Medical history, including: Past medical problems. Family medical history. Pregnancy history. Current health, including: Menstrual cycle. Method of birth control. Emotional well-being. Home life and relationship well-being. Sexual activity and sexual health. Lifestyle, including: Alcohol, nicotine or tobacco, and drug use. Access to firearms. Diet, exercise, and sleep habits. Work and work Statistician. Sunscreen use. Safety issues such as seatbelt and bike helmet use. Physical exam Your health care provider will check your: Height and weight. These may be used to calculate your BMI (body mass index). BMI is a measurement that tells if you are at a healthy weight. Waist circumference. This measures the distance around your waistline. This  measurement also tells if you are at a healthy weight and may help predict your risk of certain diseases, such as type 2 diabetes and high blood pressure. Heart rate and blood pressure. Body temperature. Skin for abnormal spots. What immunizations do I need?  Vaccines are usually given at various ages, according to a schedule. Your health care provider will recommend vaccines for you based on your age, medical history, and lifestyle or other factors, such as travel or where you work. What tests do I need? Screening Your health care provider may recommend screening tests for certain conditions. This may include: Lipid and cholesterol levels. Diabetes screening. This is done by checking your blood sugar (glucose) after you have not eaten for a while (fasting). Pelvic exam and Pap test. Hepatitis B test. Hepatitis C test. HIV (human immunodeficiency virus) test. STI (sexually transmitted infection) testing, if you are at risk. Lung cancer screening. Colorectal cancer screening. Mammogram. Talk with your health care provider about when you should start having regular mammograms. This may depend on whether you have a family history of breast cancer. BRCA-related cancer screening. This may be done if you have a family history of breast, ovarian, tubal, or peritoneal cancers. Bone density scan. This is done to screen for osteoporosis. Talk with your health care provider about your test results, treatment options, and if necessary, the need for more tests. Follow these instructions at home: Eating and drinking  Eat a diet that includes fresh fruits and vegetables,  whole grains, lean protein, and low-fat dairy products. Take vitamin and mineral supplements as recommended by your health care provider. Do not drink alcohol if: Your health care provider tells you not to drink. You are pregnant, may be pregnant, or are planning to become pregnant. If you drink alcohol: Limit how much you have to  0-1 drink a day. Know how much alcohol is in your drink. In the [place] one drink equals one 12 oz bottle of beer (355 mL), one 5 oz glass of wine (148 mL), or one 1 oz glass of hard liquor (44 mL). Lifestyle Brush your teeth every morning and night with fluoride toothpaste. Floss one time each day. Exercise for at least 30 minutes 5 or more days each week. Do not use any products that contain nicotine or tobacco. These products include cigarettes, chewing tobacco, and vaping devices, such as e-cigarettes. If you need help quitting, ask your health care provider. Do not use drugs. If you are sexually active, practice safe sex. Use a condom or other form of protection to prevent STIs. If you do not wish to become pregnant, use a form of birth control. If you plan to become pregnant, see your health care provider for a prepregnancy visit. Take aspirin only as told by your health care provider. Make sure that you understand how much to take and what form to take. Work with your health care provider to find out whether it is safe and beneficial for you to take aspirin daily. Find healthy ways to manage stress, such as: Meditation, yoga, or listening to music. Journaling. Talking to a trusted person. Spending time with friends and family. Minimize exposure to UV radiation to reduce your risk of skin cancer. Safety Always wear your seat belt while driving or riding in a vehicle. Do not drive: If you have been drinking alcohol. Do not ride with someone who has been drinking. When you are tired or distracted. While texting. If you have been using any mind-altering substances or drugs. Wear a helmet and other protective equipment during sports activities. If you have firearms in your house, make sure you follow all gun safety procedures. Seek help if you have been physically or sexually abused. What's next? Visit your health care provider once a year for an annual wellness visit. Ask your health  care provider how often you should have your eyes and teeth checked. Stay up to date on all vaccines. This information is not intended to replace advice given to you by your health care provider. Make sure you discuss any questions you have with your health care provider. Document Revised: 07/06/2020 Document Reviewed: 07/06/2020 Elsevier Patient Education  [place],   Merri Ray, MD [place], [facility] 03/15/22 2:46 PM

## 2022-03-16 LAB — COMPREHENSIVE METABOLIC PANEL
ALT: 9 U/L (ref 0–35)
AST: 15 U/L (ref 0–37)
Albumin: 4.2 g/dL (ref 3.5–5.2)
Alkaline Phosphatase: 86 U/L (ref 39–117)
BUN: 12 mg/dL (ref 6–23)
CO2: 27 mEq/L (ref 19–32)
Calcium: 9.2 mg/dL (ref 8.4–10.5)
Chloride: 103 mEq/L (ref 96–112)
Creatinine, Ser: 0.81 mg/dL (ref 0.40–1.20)
GFR: 88.99 mL/min (ref >60.00)
Glucose, Bld: 87 mg/dL (ref 70–99)
Potassium: 4 mEq/L (ref 3.5–5.1)
Sodium: 140 mEq/L (ref 135–145)
Total Bilirubin: 0.4 mg/dL (ref 0.2–1.2)
Total Protein: 6.7 g/dL (ref 6.0–8.3)

## 2022-03-16 LAB — CBC WITH DIFFERENTIAL/PLATELET
Basophils Absolute: 0 10*3/uL (ref 0.0–0.1)
Basophils Relative: 0.3 % (ref 0.0–3.0)
Eosinophils Absolute: 0.1 10*3/uL (ref 0.0–0.7)
Eosinophils Relative: 1.5 % (ref 0.0–5.0)
HCT: 38.4 % (ref 36.0–46.0)
Hemoglobin: 13.2 g/dL (ref 12.0–15.0)
Lymphocytes Relative: 24.1 % (ref 12.0–46.0)
Lymphs Abs: 2.2 10*3/uL (ref 0.7–4.0)
MCHC: 34.2 g/dL (ref 30.0–36.0)
MCV: 87.4 fl (ref 78.0–100.0)
Monocytes Absolute: 0.4 10*3/uL (ref 0.1–1.0)
Monocytes Relative: 4.9 % (ref 3.0–12.0)
Neutro Abs: 6.2 10*3/uL (ref 1.4–7.7)
Neutrophils Relative %: 69.2 % (ref 43.0–77.0)
Platelets: 369 10*3/uL (ref 150.0–400.0)
RBC: 4.4 Mil/uL (ref 3.87–5.11)
RDW: 13.8 % (ref 11.5–15.5)
WBC: 9 10*3/uL (ref 4.0–10.5)

## 2022-03-16 LAB — LIPID PANEL
Cholesterol: 226 mg/dL — ABNORMAL HIGH (ref 0–200)
HDL: 42.5 mg/dL (ref >39.00)
LDL Cholesterol: 149 mg/dL — ABNORMAL HIGH (ref 0–99)
NonHDL: 183.22
Total CHOL/HDL Ratio: 5
Triglycerides: 172 mg/dL — ABNORMAL HIGH (ref 0.0–149.0)
VLDL: 34.4 mg/dL (ref 0.0–40.0)

## 2022-03-16 LAB — HEMOGLOBIN A1C: Hgb A1c MFr Bld: 6 % (ref 4.6–6.5)

## 2022-04-12 ENCOUNTER — Other Ambulatory Visit: Payer: Self-pay | Admitting: Family Medicine

## 2022-04-12 DIAGNOSIS — G4700 Insomnia, unspecified: Secondary | ICD-10-CM

## 2022-04-12 NOTE — Telephone Encounter (Signed)
Office visit February 22.  Medication discussed at that time.  Stable.  Controlled substance database reviewed.  Last prescription filled 03/15/2022, refill ordered.

## 2022-04-12 NOTE — Telephone Encounter (Signed)
Temazepam 15 mg LOV: 03/15/22 Last Refill:03/15/22 Upcoming appt: 09/13/22

## 2022-04-19 DIAGNOSIS — Z124 Encounter for screening for malignant neoplasm of cervix: Secondary | ICD-10-CM | POA: Diagnosis not present

## 2022-04-19 DIAGNOSIS — Z6837 Body mass index (BMI) 37.0-37.9, adult: Secondary | ICD-10-CM | POA: Diagnosis not present

## 2022-04-19 DIAGNOSIS — Z808 Family history of malignant neoplasm of other organs or systems: Secondary | ICD-10-CM | POA: Diagnosis not present

## 2022-04-19 DIAGNOSIS — Z1231 Encounter for screening mammogram for malignant neoplasm of breast: Secondary | ICD-10-CM | POA: Diagnosis not present

## 2022-04-19 DIAGNOSIS — Z01419 Encounter for gynecological examination (general) (routine) without abnormal findings: Secondary | ICD-10-CM | POA: Diagnosis not present

## 2022-04-19 DIAGNOSIS — R319 Hematuria, unspecified: Secondary | ICD-10-CM | POA: Diagnosis not present

## 2022-04-19 DIAGNOSIS — Z1151 Encounter for screening for human papillomavirus (HPV): Secondary | ICD-10-CM | POA: Diagnosis not present

## 2022-04-19 DIAGNOSIS — Z8041 Family history of malignant neoplasm of ovary: Secondary | ICD-10-CM | POA: Diagnosis not present

## 2022-04-19 LAB — HM MAMMOGRAPHY

## 2022-04-20 LAB — HM PAP SMEAR: HPV, high-risk: NEGATIVE

## 2022-05-03 ENCOUNTER — Ambulatory Visit: Payer: BC Managed Care – PPO | Admitting: Family Medicine

## 2022-05-03 ENCOUNTER — Encounter: Payer: Self-pay | Admitting: Family Medicine

## 2022-05-03 VITALS — BP 138/88 | HR 78 | Temp 98.6°F | Ht 60.0 in | Wt 190.4 lb

## 2022-05-03 DIAGNOSIS — R6883 Chills (without fever): Secondary | ICD-10-CM

## 2022-05-03 DIAGNOSIS — R1032 Left lower quadrant pain: Secondary | ICD-10-CM

## 2022-05-03 DIAGNOSIS — R1012 Left upper quadrant pain: Secondary | ICD-10-CM

## 2022-05-03 DIAGNOSIS — R61 Generalized hyperhidrosis: Secondary | ICD-10-CM | POA: Diagnosis not present

## 2022-05-03 DIAGNOSIS — K6289 Other specified diseases of anus and rectum: Secondary | ICD-10-CM

## 2022-05-03 DIAGNOSIS — K5900 Constipation, unspecified: Secondary | ICD-10-CM

## 2022-05-03 LAB — COMPREHENSIVE METABOLIC PANEL
ALT: 13 U/L (ref 0–35)
AST: 12 U/L (ref 0–37)
Albumin: 4.2 g/dL (ref 3.5–5.2)
Alkaline Phosphatase: 66 U/L (ref 39–117)
BUN: 12 mg/dL (ref 6–23)
CO2: 23 mEq/L (ref 19–32)
Calcium: 9 mg/dL (ref 8.4–10.5)
Chloride: 107 mEq/L (ref 96–112)
Creatinine, Ser: 0.86 mg/dL (ref 0.40–1.20)
GFR: 82.74 mL/min (ref >60.00)
Glucose, Bld: 115 mg/dL — ABNORMAL HIGH (ref 70–99)
Potassium: 3.4 mEq/L — ABNORMAL LOW (ref 3.5–5.1)
Sodium: 141 mEq/L (ref 135–145)
Total Bilirubin: 0.4 mg/dL (ref 0.2–1.2)
Total Protein: 7.2 g/dL (ref 6.0–8.3)

## 2022-05-03 LAB — CBC
HCT: 40.4 % (ref 36.0–46.0)
Hemoglobin: 13.7 g/dL (ref 12.0–15.0)
MCHC: 33.9 g/dL (ref 30.0–36.0)
MCV: 87 fl (ref 78.0–100.0)
Platelets: 393 10*3/uL (ref 150.0–400.0)
RBC: 4.65 Mil/uL (ref 3.87–5.11)
RDW: 13.7 % (ref 11.5–15.5)
WBC: 8.4 10*3/uL (ref 4.0–10.5)

## 2022-05-03 LAB — LIPASE: Lipase: 27 U/L (ref 11.0–59.0)

## 2022-05-03 NOTE — Addendum Note (Signed)
Addended by: Meredith Staggers R on: 05/03/2022 01:16 PM   Modules accepted: Orders

## 2022-05-03 NOTE — Progress Notes (Signed)
Subjective:  Patient ID: Beverly Atkinson, female    DOB: 24-Nov-1980  Age: 42 y.o. MRN: [id number]  CC:  Chief Complaint  Patient presents with   Constipation    Pt states that for the last two weeks she has has constipation, gas and belatedness. She states abdominal pain but denies and nausea or vomiting. She states she feels lots of pressure in rectum. Low grade fever off and on for last three days.      HPI Beverly Atkinson presents for   Abdominal pain: Past 2.5 weeks. noted after menses. Typically has constipation or diarrhea at time of menses. Was feeling constipation this time. Few days of constipation tried a Linzess 11 days ago. Helped some, some liquid stool, then persistent constipation. No BM form Tuesday of last week until 3 days ago after taking Senna. 4-5 BM's hard and soft stools, last few light stools. No blood in stool.  2 senna yesterday - 1 hard stool yesterday.  Continued gas, abdominal cramping past few weeks. No n/v.  Low grade fever past 3-4 days. Chills 4 nights ago. Temp in 99 range - 99.8.  Some persistent chills. Night sweats 4 days ago.  Neg covid test yesterday.  No recent travel or sick contacts. Tx: senna as above.  Reports normal colonoscopy in 2014 that was normal. No recent GI eval.  Eating/drinking ok.  No tramadol in past few weeks.  Pressure in rectum past few months.  Plan for transvaginal ultrasound next week - hx of endometriosis.     History Patient Active Problem List   Diagnosis Date Noted   Constipation 08/21/2019   Hyperlipidemia 07/15/2018   Pain management contract signed 09/27/2017   Opiate dependence 09/27/2017   Polycystic ovaries 06/18/2017   History of endometriosis 06/18/2017   Hypothyroidism due to Hashimoto's thyroiditis 06/18/2017   Essential hypertension 06/13/2017   Hashimoto's thyroiditis 03/18/2017   Primary hypothyroidism 03/18/2017   LBBB (left bundle branch block) 03/18/2017   Chronic low back pain 03/18/2017    Morbid obesity 03/18/2017   Abnormal uterine bleeding (AUB) 08/09/2015   Chronic pelvic pain in female 08/09/2015   Epigastric pain 01/26/2014   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 Authorizing Provider  B Complex Vitamins (VITAMIN B COMPLEX PO) Take by mouth.   Yes [provider]  Calcium Carbonate-Vitamin D 300-100 MG-UNIT CAPS Take by mouth.   Yes [provider]  Multiple Vitamins-Minerals (MULTIVITAMIN PO) Take by mouth.   Yes [provider]  NON FORMULARY    Yes [provider]  ondansetron (ZOFRAN ODT) 4 MG disintegrating tablet Take 1 tablet (4 mg total) by mouth every 8 (eight) hours as needed for nausea or vomiting. 04/23/19  Yes Hawks, Christy A, FNP  OVER THE COUNTER MEDICATION Take 1 capsule by  mouth daily as needed. [employer] Thyroid complex.   Yes [provider]  sertraline (ZOLOFT) 100 MG tablet Take 1 tablet (100 mg total) by mouth daily. 03/15/22  Yes Shade FloodGreene, Bradden Tadros R, MD  temazepam (RESTORIL) 15 MG capsule TAKE 1 CAPSULE BY MOUTH AT BEDTIME AS NEEDED FOR SLEEP 04/12/22  Yes Shade FloodGreene, Demyan Fugate R, MD  thyroid (NP THYROID) 90 MG tablet Take 1 tablet (90 mg total) by mouth daily. 01/02/22  Yes Shamleffer, Konrad DoloresIbtehal Jaralla, MD  Turmeric Curcumin 500 MG CAPS Take 1 capsule by mouth daily.   Yes [provider]  valsartan (DIOVAN)  80 MG tablet Take 1 tablet (80 mg total) by mouth daily. 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   Social Determinants of Health   Financial Resource Strain: Not on file  Food Insecurity: Not on file  Transportation Needs: Not on file  Physical Activity: Not on file  Stress: Not on file  Social Connections: Not on file  Intimate Partner Violence: Not on file    Review of Systems   Objective:   Vitals:   05/03/22 1128  BP: 138/88  Pulse: 78  Temp: 98.6 F (37 C)  TempSrc: Temporal  SpO2: 97%  Weight: 190 lb 6.4 oz (86.4 kg)  Height: 5' (1.524 m)     Physical Exam Vitals reviewed.  Constitutional:      General: She is not in acute distress.    Appearance: Normal appearance. She is well-developed. She is not ill-appearing or toxic-appearing.  HENT:     Head: Normocephalic and atraumatic.  Eyes:     Conjunctiva/sclera: Conjunctivae normal.     Pupils: Pupils are equal, round, and reactive to light.  Neck:     Vascular: No carotid bruit.  Cardiovascular:     Rate and Rhythm: Normal rate and regular rhythm.     Heart sounds: Normal heart sounds.  Pulmonary:     Effort: Pulmonary effort is normal.     Breath sounds: Normal breath sounds.  Abdominal:     General: There is no distension.     Palpations: Abdomen is soft. There is no pulsatile mass.     Tenderness: There is  abdominal tenderness (Left lower quadrant, left upper quadrant without rebound/guarding.). There is no guarding.  Musculoskeletal:     Right lower leg: No edema.     Left lower leg: No edema.  Skin:    General: Skin is warm and dry.  Neurological:     Mental Status: She is alert and oriented to person, place, and time.  Psychiatric:        Mood and Affect: Mood normal.        Behavior: Behavior normal.     Assessment & Plan:  Beverly Atkinson is a 42 y.o.  female . LLQ abdominal pain - Plan: CBC, Comprehensive metabolic panel, Lipase  LUQ abdominal pain - Plan: CBC, Comprehensive metabolic panel, Lipase  Night sweats - Plan: CBC  Chills - Plan: CBC  Constipation, unspecified constipation type  Rectal pain  Left lower quadrant abdominal pain - Plan: CT Abdomen Pelvis W Contrast  Initial suspected constipation, minimal improvement with Linzess and then senna, now with progressive discomfort, chills, night sweats, elevated temp as above.  History of endometriosis, plan ultrasound next week, but differential includes colitis, diverticulitis with location of discomfort.  Less likely pancreatitis without nausea or vomiting.  Less likely gastritis as left-sided and left lower quadrant. Check CBC, CMP, lipase and CT abdomen pelvis ordered today.  Depending on results consider gastroenterology eval or other constipation treatment.  No orders of the defined types were placed in this encounter.  Patient Instructions  I will check some labs today but I am also ordering a CT scan with the recent onset of chills and night sweats to evaluate for any sign of infection or colitis.  You should receive a call today about that appointment.  Depending on results of the studies can discuss next step.  Hang in there.  Abdominal Pain, Adult Pain in the abdomen (abdominal pain) can be caused by many things. Often, abdominal pain is not serious and it gets better with no treatment or by being treated at  home. However, sometimes abdominal pain is serious. Your health care provider will ask questions about your medical history and do a physical exam to try to determine the cause of your abdominal pain. Follow these instructions at home: Medicines Take over-the-counter and prescription medicines only as told by your health care provider. Do not take a laxative unless told by your health care provider. General instructions  Watch your condition for any changes. Drink enough fluid to keep your urine pale yellow. Keep all follow-up visits as told by your health care provider. This is important. Contact a health care provider if: Your abdominal pain changes or gets worse. You are not hungry or you lose weight without trying. You are constipated or have diarrhea for more than 2-3 days. You have pain when you urinate or have a bowel movement. Your abdominal pain wakes you up at night. Your pain gets worse with meals, after eating, or with certain foods. You are vomiting and cannot keep anything down. You have a fever. You have blood in your urine. Get help right away if: Your pain does not go away as soon as your health care provider told you to expect. You cannot stop vomiting. Your pain is only in areas of the abdomen, such as the right side or the left lower portion of the abdomen. Pain on the right side could be caused by appendicitis. You have bloody or black stools, or stools that look like tar. You have severe pain, cramping, or bloating in your abdomen. You have signs of dehydration, such as: Dark urine, very little urine, or no urine. Cracked lips. Dry mouth. Sunken eyes. Sleepiness. Weakness. You have trouble breathing or chest pain. Summary Often, abdominal pain is not serious and it gets better with no treatment or by being treated at home. However, sometimes abdominal pain is serious. Watch your condition for any changes. Take over-the-counter and prescription medicines  only as told by your health care provider. Contact a health care provider if your abdominal pain changes or gets worse. Get help right away if you have severe pain, cramping, or  bloating in your abdomen. This information is not intended to replace advice given to you by your health care provider. Make sure you discuss any questions you have with your health care provider. Document Revised: 02/27/2019 Document Reviewed: 05/19/2018 Elsevier Patient Education  2023 Elsevier Inc.     Signed,   Meredith Staggers, MD [facility], [facility] 05/03/22 12:34 PM

## 2022-05-03 NOTE — Patient Instructions (Signed)
I will check some labs today but I am also ordering a CT scan with the recent onset of chills and night sweats to evaluate for any sign of infection or colitis.  You should receive a call today about that appointment.  Depending on results of the studies can discuss next step.  Hang in there.  Abdominal Pain, Adult Pain in the abdomen (abdominal pain) can be caused by many things. Often, abdominal pain is not serious and it gets better with no treatment or by being treated at home. However, sometimes abdominal pain is serious. Your health care provider will ask questions about your medical history and do a physical exam to try to determine the cause of your abdominal pain. Follow these instructions at home: Medicines Take over-the-counter and prescription medicines only as told by your health care provider. Do not take a laxative unless told by your health care provider. General instructions  Watch your condition for any changes. Drink enough fluid to keep your urine pale yellow. Keep all follow-up visits as told by your health care provider. This is important. Contact a health care provider if: Your abdominal pain changes or gets worse. You are not hungry or you lose weight without trying. You are constipated or have diarrhea for more than 2-3 days. You have pain when you urinate or have a bowel movement. Your abdominal pain wakes you up at night. Your pain gets worse with meals, after eating, or with certain foods. You are vomiting and cannot keep anything down. You have a fever. You have blood in your urine. Get help right away if: Your pain does not go away as soon as your health care provider told you to expect. You cannot stop vomiting. Your pain is only in areas of the abdomen, such as the right side or the left lower portion of the abdomen. Pain on the right side could be caused by appendicitis. You have bloody or black stools, or stools that look like tar. You have severe pain,  cramping, or bloating in your abdomen. You have signs of dehydration, such as: Dark urine, very little urine, or no urine. Cracked lips. Dry mouth. Sunken eyes. Sleepiness. Weakness. You have trouble breathing or chest pain. Summary Often, abdominal pain is not serious and it gets better with no treatment or by being treated at home. However, sometimes abdominal pain is serious. Watch your condition for any changes. Take over-the-counter and prescription medicines only as told by your health care provider. Contact a health care provider if your abdominal pain changes or gets worse. Get help right away if you have severe pain, cramping, or bloating in your abdomen. This information is not intended to replace advice given to you by your health care provider. Make sure you discuss any questions you have with your health care provider. Document Revised: 02/27/2019 Document Reviewed: 05/19/2018 Elsevier Patient Education  2023 [employer].

## 2022-05-09 DIAGNOSIS — R140 Abdominal distension (gaseous): Secondary | ICD-10-CM | POA: Diagnosis not present

## 2022-05-09 DIAGNOSIS — R102 Pelvic and perineal pain: Secondary | ICD-10-CM | POA: Diagnosis not present

## 2022-05-10 ENCOUNTER — Encounter: Payer: Self-pay | Admitting: Internal Medicine

## 2022-05-11 ENCOUNTER — Other Ambulatory Visit: Payer: Self-pay | Admitting: Family Medicine

## 2022-05-11 DIAGNOSIS — G4700 Insomnia, unspecified: Secondary | ICD-10-CM

## 2022-05-11 IMAGING — CT CT ABD-PELV W/ CM
2 of 4 series · 17 of 46 positions shown, 19 images · IV contrast (Omnipaque or Isovue)
Comparison: None.

CLINICAL DATA: PT c/o Low abdominal pain for 2 weeks, she says she
has endometriosis and the pain is chronic, h/o endometrial ablation,
cholecystectomy.^100mL OMNIPAQUE IOHEXOL 300 MG/ML SOLN

EXAM:
CT ABDOMEN AND PELVIS WITH CONTRAST
TECHNIQUE: Multidetector CT imaging of the abdomen and pelvis was performed
using the standard protocol following bolus administration of
intravenous contrast.
CONTRAST:  100mL OMNIPAQUE IOHEXOL 300 MG/ML  SOLN

[Series 2: axial st · axial · 0.60mm/px · z∈[+1040,+1475]mm · 14 of 95 slices shown, 16 images]
[im 4/95  soft-tissue]
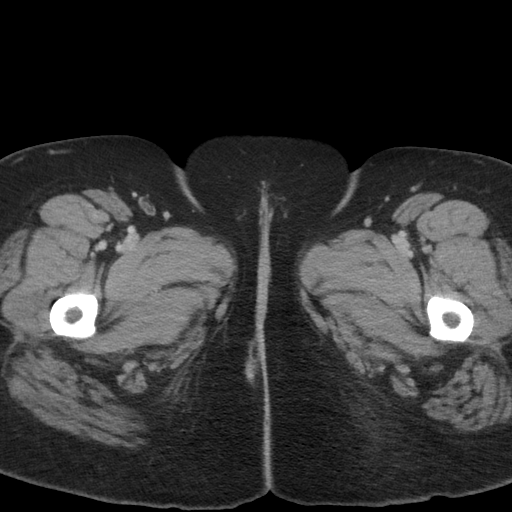
[im 4/95  bone]
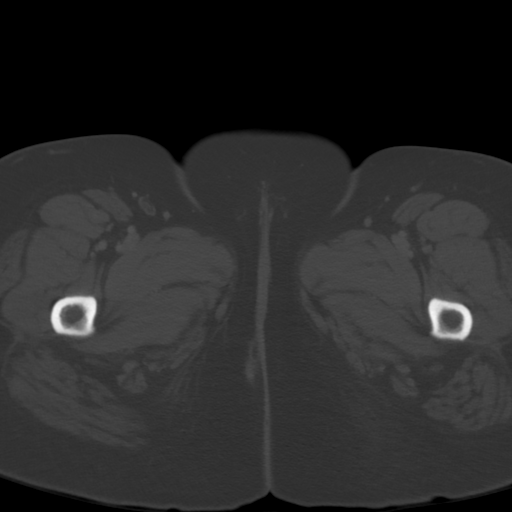
[im 12/95  soft-tissue]
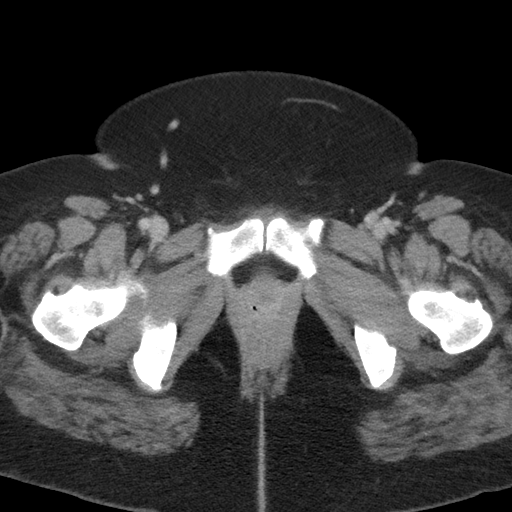
[im 20/95  soft-tissue]
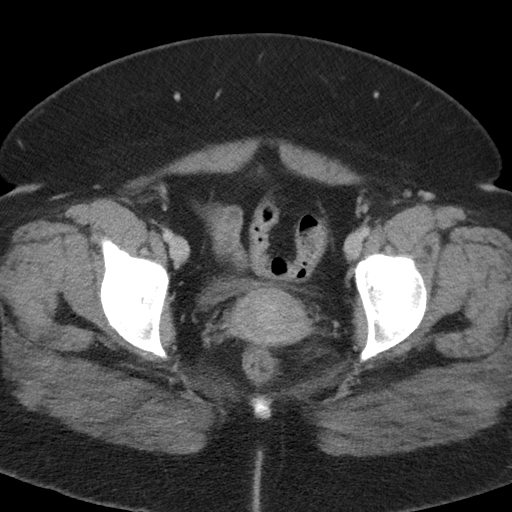
[im 24/95  soft-tissue]
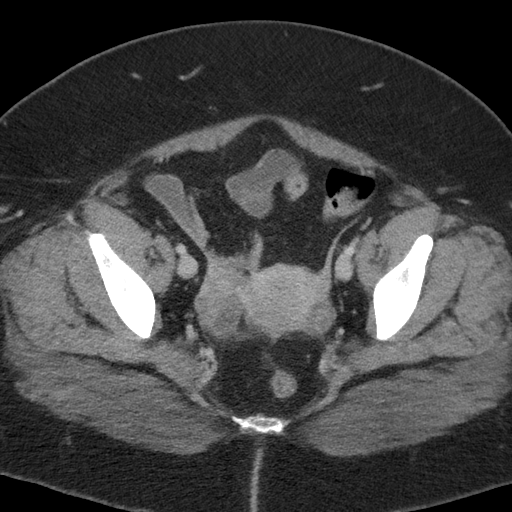
[im 32/95  soft-tissue]
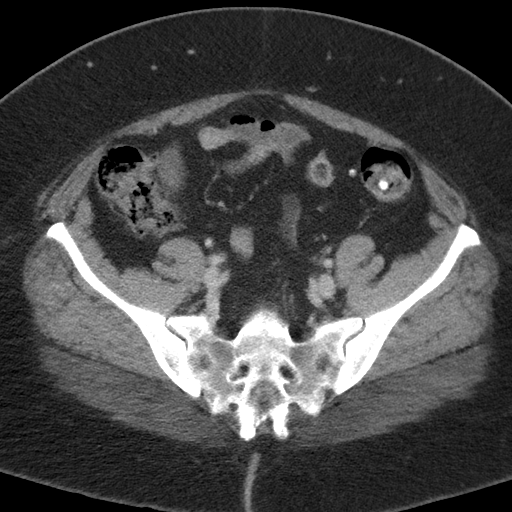
[im 40/95  soft-tissue]
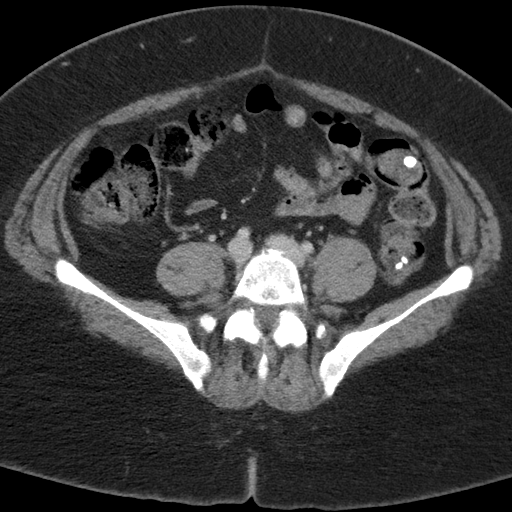
[im 44/95  soft-tissue]
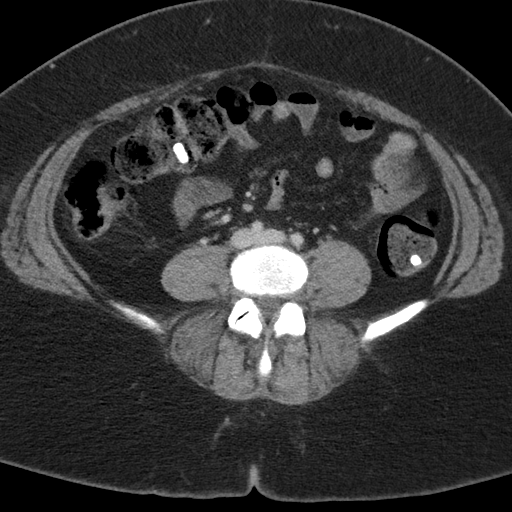
[im 51/95  soft-tissue]
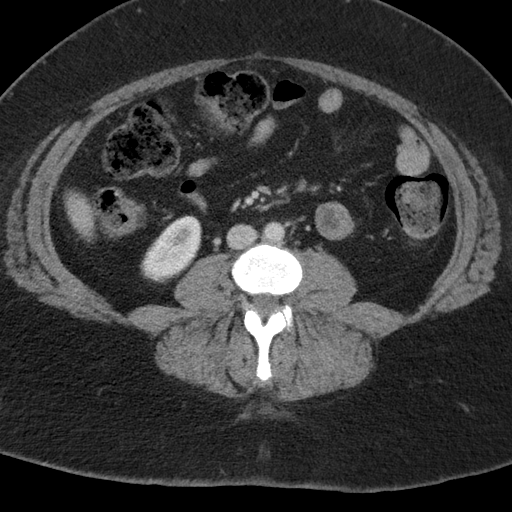
[im 55/95  soft-tissue]
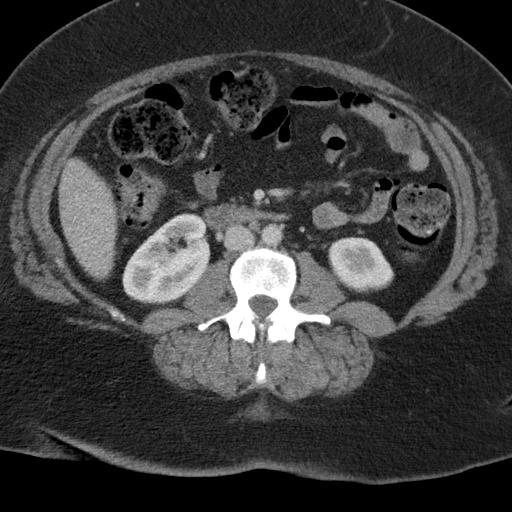
[im 55/95  bone]
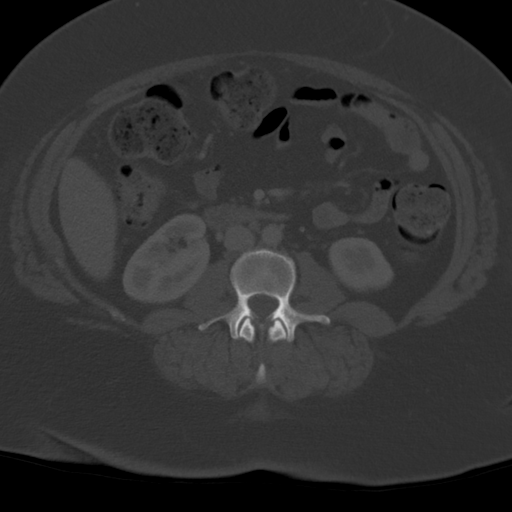
[im 63/95  soft-tissue]
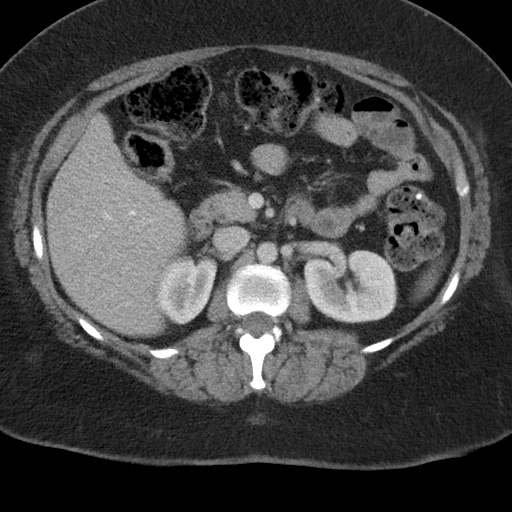
[im 71/95  soft-tissue]
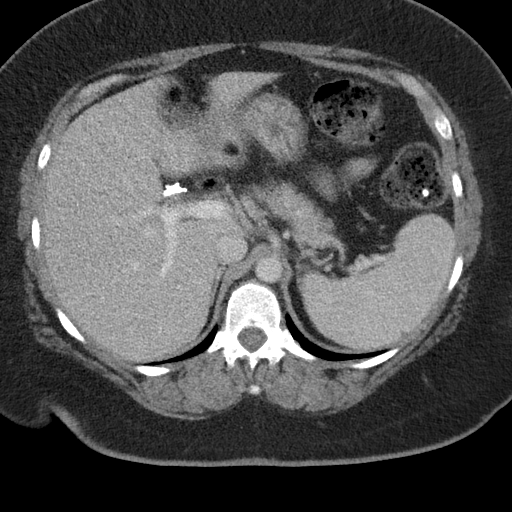
[im 75/95  soft-tissue]
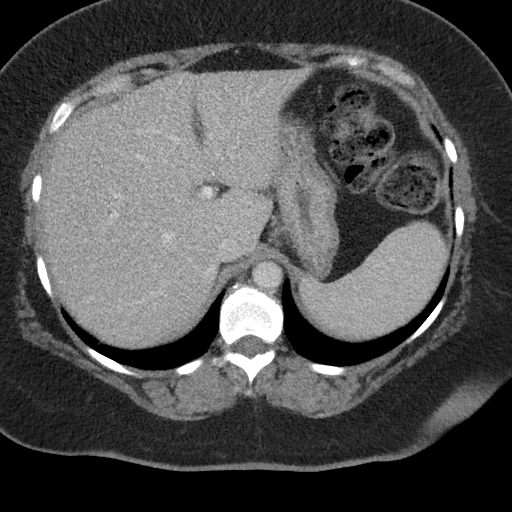
[im 83/95  soft-tissue]
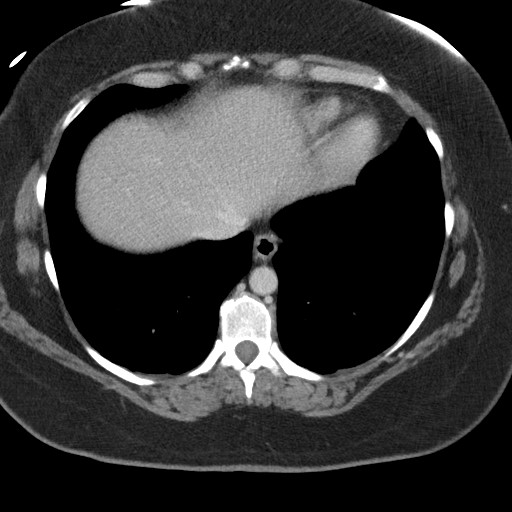
[im 91/95  soft-tissue]
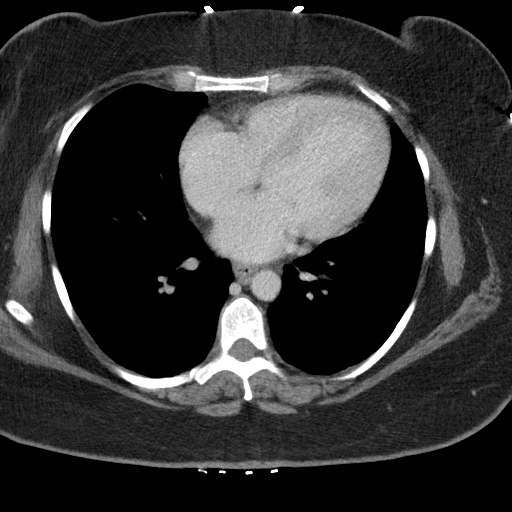

[Series 5: coronal st · coronal · 0.82mm/px · 3 of 117 slices shown]
[im 39/117  soft-tissue]
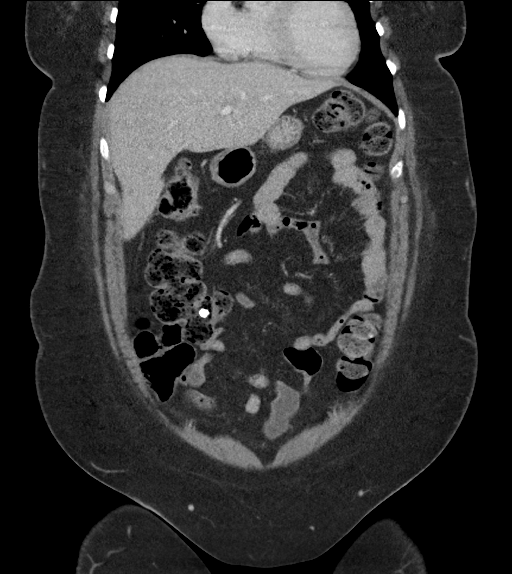
[im 52/117  soft-tissue]
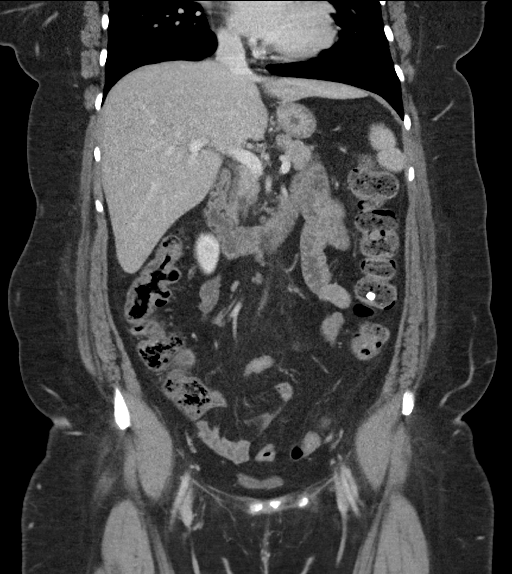
[im 65/117  soft-tissue]
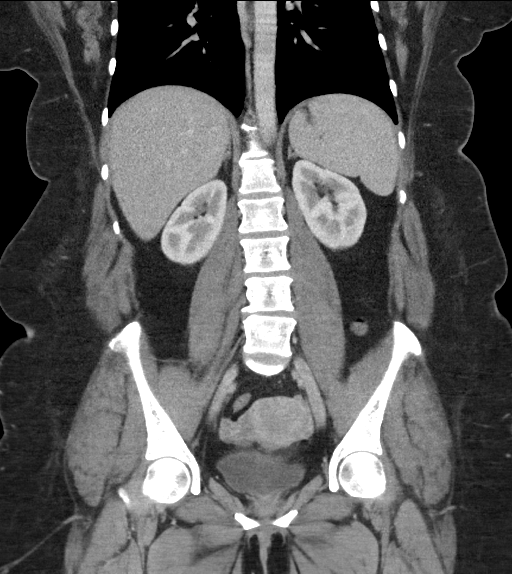

[17 of 46 positions shown; findings below may reference images not displayed]

FINDINGS: Lower chest: No acute abnormality.

Hepatobiliary: No focal liver abnormality is seen. Status post
cholecystectomy. No biliary dilatation.

Pancreas: Unremarkable. No pancreatic ductal dilatation or
surrounding inflammatory changes.

Spleen: Normal in size without focal abnormality.

Adrenals/Urinary Tract: Adrenal glands are unremarkable. Kidneys are
normal, without renal calculi, focal lesion, or hydronephrosis.
Bladder is unremarkable.

Stomach/Bowel: Stomach is within normal limits. The appendix is not
well visualized and may be surgically absent. No evidence of bowel
wall thickening, distention, or inflammatory changes. There is stool
throughout the colon.

Vascular/Lymphatic: No significant vascular findings are present. No
enlarged abdominal or pelvic lymph nodes.

Reproductive: Uterus and bilateral adnexa are unremarkable.

Other: No abdominal wall hernia or abnormality. No abdominopelvic
ascites.

Musculoskeletal: No acute or significant osseous findings.
IMPRESSION: No acute findings in the abdomen or pelvis.

## 2022-05-11 NOTE — Telephone Encounter (Signed)
medication discussed at her visit in February.  Controlled substance database reviewed.  Temazepam 15 mg #30 last filled 04/13/2022, refill ordered.

## 2022-05-30 DIAGNOSIS — Z809 Family history of malignant neoplasm, unspecified: Secondary | ICD-10-CM | POA: Diagnosis not present

## 2022-06-05 ENCOUNTER — Other Ambulatory Visit: Payer: Self-pay | Admitting: Family Medicine

## 2022-06-05 DIAGNOSIS — G4700 Insomnia, unspecified: Secondary | ICD-10-CM

## 2022-06-05 NOTE — Telephone Encounter (Signed)
Patient is requesting a refill of the following medications: Requested Prescriptions   Pending Prescriptions Disp Refills   temazepam (RESTORIL) 15 MG capsule [Pharmacy Med Name: Temazepam 15 MG Oral Capsule] 30 capsule 0    Sig: TAKE 1 CAPSULE BY MOUTH AT BEDTIME AS NEEDED FOR SLEEP    Date of patient request: 06/05/22 Last office visit: 05/03/22 Date of last refill: 05/11/22 Last refill amount: 30 Follow up time period per chart: PRN

## 2022-06-05 NOTE — Telephone Encounter (Signed)
Controlled substance database reviewed.  #30 of temazepam last filled 05/11/2022, refill ordered so ready when due for pickup.  Medication was discussed in February.

## 2022-06-08 ENCOUNTER — Other Ambulatory Visit: Payer: Self-pay

## 2022-06-13 ENCOUNTER — Ambulatory Visit: Payer: BC Managed Care – PPO | Admitting: Internal Medicine

## 2022-07-03 ENCOUNTER — Encounter: Payer: Self-pay | Admitting: Internal Medicine

## 2022-07-03 ENCOUNTER — Ambulatory Visit: Payer: BC Managed Care – PPO | Admitting: Internal Medicine

## 2022-07-03 VITALS — BP 118/74 | HR 84 | Ht 60.0 in | Wt 191.0 lb

## 2022-07-03 DIAGNOSIS — E063 Autoimmune thyroiditis: Secondary | ICD-10-CM

## 2022-07-03 DIAGNOSIS — Z7985 Long-term (current) use of injectable non-insulin antidiabetic drugs: Secondary | ICD-10-CM

## 2022-07-03 DIAGNOSIS — E119 Type 2 diabetes mellitus without complications: Secondary | ICD-10-CM

## 2022-07-03 DIAGNOSIS — E876 Hypokalemia: Secondary | ICD-10-CM

## 2022-07-03 DIAGNOSIS — E038 Other specified hypothyroidism: Secondary | ICD-10-CM

## 2022-07-03 DIAGNOSIS — R7303 Prediabetes: Secondary | ICD-10-CM

## 2022-07-03 LAB — TSH: TSH: 2.41 u[IU]/mL (ref 0.35–5.50)

## 2022-07-03 LAB — HEMOGLOBIN A1C: Hgb A1c MFr Bld: 6.8 % — ABNORMAL HIGH (ref 4.6–6.5)

## 2022-07-03 LAB — POTASSIUM: Potassium: 4 mEq/L (ref 3.5–5.1)

## 2022-07-03 NOTE — Patient Instructions (Signed)

## 2022-07-03 NOTE — Progress Notes (Signed)
Name: Beverly Atkinson  MRN/ DOB: [id number], 03-26-1980    Age/ Sex: 42 y.o., female    PCP: Shade Flood, MD   Reason for Endocrinology Evaluation: Hypothyroidism     Date of Initial Endocrinology Evaluation: 01/01/2022    HPI: Beverly Atkinson is a 42 y.o. female with a past medical history of Hypothyroidism . The patient presented for initial endocrinology clinic visit on 01/01/2022  for consultative assistance with her Hypothyroidism   She was diagnosed with hypothyroidism  at ~ age 3. She was on Synthroid but due to insurance coverage , this was switched to levothyroxine but her TSH was difficult to control    She was started on NP thyroid through [place] ~ a couple of  years    Patient has been noted with low TSH since 2019 with a nadir of 0.00 uIU/mL in January 2023   Weight stable  Has noted local neck swelling  Denies XRT  She is on Thyroid  Complex that she takes every other day and it makes her feel better    Mother and paternal grandmother with thyroid disease    Of note the patient is on NP thyroid 90 mg daily    Of note, the pt was diagnosed with PCOS in the past. She has tried multiple COC's without much help . She has been focusing on diet and life style changes .  She follows with GYN.  She gets menstruations monthly (more every 3 weeks) She has also been diagnosed with pre-diabetes , last A1c 6.0 %    SUBJECTIVE:    Today (07/03/22):  Ms. You is here for a follow up on Hypothyroidism, pre-diabetes and   She drinks sugar-sweetened beverages , she is intolerant aspartame  She does snack  She is trying to get back to the exercise   Denies local neck swelling  She has been running low grade fever ~ 100 with recent work up negative for infection  She had  constipation but this resolved  , no diarrhea   She is on norethindrone through Gyn with endometriosis pain  Denies acne or hirsutism      NP thyroid 90 mg daily     HISTORY:   Past Medical History:  Past Medical History:  Diagnosis Date   Arthritis    Depression    Heart murmur    Insomnia    Restless leg    Thyroid disease    Past Surgical History:  Past Surgical History:  Procedure Laterality Date   CHOLECYSTECTOMY     2015   EYE SURGERY     Lazy eye at age 24   LAPAROSCOPIC ENDOMETRIOSIS FULGURATION  08/23/2015    Social History:  reports that she has never smoked. She has never used smokeless tobacco. She reports current alcohol use. She reports that she does not use drugs. Family History: family history includes Alcohol abuse in her father and mother; Arthritis in her father and paternal grandmother; Cancer in her father; Drug abuse in her father, mother, and sister; Heart disease in her father; Hyperlipidemia in her father; Hypertension in her father; Mental illness in her mother; Thyroid disease in her mother.   HOME MEDICATIONS: Allergies as of 07/03/2022       Reactions   Ace Inhibitors Cough   Clarithromycin Rash        Medication List        Accurate as of July 03, 2022 11:53 AM. If you have any questions, ask  your nurse or doctor.          Calcium Carbonate-Vitamin D 300-100 MG-UNIT Caps Take by mouth.   MULTIVITAMIN PO Take by mouth.   NON FORMULARY   norethindrone 5 MG tablet Commonly known as: AYGESTIN Take 5 mg by mouth daily.   ondansetron 4 MG disintegrating tablet Commonly known as: Zofran ODT Take 1 tablet (4 mg total) by mouth every 8 (eight) hours as needed for nausea or vomiting.   oseltamivir 75 MG capsule Commonly known as: TAMIFLU TAKE 1 CAPSULE BY MOUTH TWICE DAILY FOR 5 DAYS   OVER THE COUNTER MEDICATION Take 1 capsule by mouth daily as needed. The [employer] shop[employer] Thyroid complex.   sertraline 100 MG tablet Commonly known as: ZOLOFT Take 1 tablet (100 mg total) by mouth daily.   temazepam 15 MG capsule Commonly known as: RESTORIL TAKE 1 CAPSULE BY MOUTH AT BEDTIME AS NEEDED FOR SLEEP    thyroid 90 MG tablet Commonly known as: NP Thyroid Take 1 tablet (90 mg total) by mouth daily.   traMADol 50 MG tablet Commonly known as: ULTRAM TAKE 1 TABLET BY MOUTH EVERY 8 HOURS AS NEEDED FOR UP TO 5 DAYS   Turmeric Curcumin 500 MG Caps Take 1 capsule by mouth daily.   valsartan 80 MG tablet Commonly known as: DIOVAN Take 1 tablet (80 mg total) by mouth daily.   VITAMIN B COMPLEX PO Take by mouth.          REVIEW OF SYSTEMS: A comprehensive ROS was conducted with the patient and is negative except as per HPI     OBJECTIVE:  VS: BP 118/74 (BP Location: Left Arm, Patient Position: Sitting, Cuff Size: Large)   Pulse 84   Ht 5' (1.524 m)   Wt 191 lb (86.6 kg)   SpO2 96%   BMI 37.30 kg/m    Wt Readings from Last 3 Encounters:  07/03/22 191 lb (86.6 kg)  05/03/22 190 lb 6.4 oz (86.4 kg)  03/15/22 192 lb (87.1 kg)     EXAM: General: Pt appears well and is in NAD  Eyes: External eye exam normal without stare, lid lag or exophthalmos.  EOM intact.  PERRL.  Neck: General: Supple without adenopathy. Thyroid: Thyroid size normal.  No goiter or nodules appreciated. No thyroid bruit.  Lungs: Clear with good BS bilat with no rales, rhonchi, or wheezes  Heart: Auscultation: RRR.  Abdomen: Normoactive bowel sounds, soft, nontender, without masses or organomegaly palpable  Extremities:  BL LE: No pretibial edema normal ROM and strength.  Mental Status: Judgment, insight: Intact Orientation: Oriented to time, place, and person Mood and affect: No depression, anxiety, or agitation     DATA REVIEWED:  Latest Reference Range & Units 07/03/22 12:22  Potassium 3.5 - 5.1 mEq/L 4.0      Latest Reference Range & Units 07/03/22 12:22  Hemoglobin A1C 4.6 - 6.5 % 6.8 (H)  TSH 0.35 - 5.50 uIU/mL 2.41  (H): Data is abnormally high   Latest Reference Range & Units Most Recent  Thyroperoxidase Ab SerPl-aCnc 0 - 34 IU/mL 120 (H) 03/18/17 10:16      ASSESSMENT/PLAN/RECOMMENDATIONS:   Hashimoto's thyroiditis :  -Patient is clinically euthyroid -TSH normal  Medications : NP thyroid 90 mg daily  2. PCOS :  -She has tried multiple COC's without benefit, she follows with gynecology -Historically she has had normal testosterone level, no acne no hirsutism She is on norethindrone  3. Newly Diagnosed DM :  -A1c 6.8% -Will  start Ozempic, caution against GI side effects -One Touch Verio was sent to the pharmacy -Patient to check daily if possible, or at least 2-3 times a week -Patient offered nurse visit for training on injection technique, she may also check educational videos online or asked the pharmacist for help -I have recommended low-carb diet    4. Hypokalemia   -Potassium is normal on today's lab -Will proceed with 24-hour urinary cortisol, Aldo, and renin  F/U in 6 months    Addendum: Discussed results with the patient on 07/04/2022 at 1620  Signed electronically by: Lyndle Herrlich, MD  [facility]  [facility] [address] [place] [place], [place] [address] Phone: [phone] FAX: [phone]   CC: Shade Flood, MD [address] [place] [address] Phone: [phone] Fax: [phone]   Return to Endocrinology clinic as below: Future Appointments  Date Time Provider Department Center  07/18/2022 10:00 AM Hilarie Fredrickson, MD [facility]  09/13/2022 10:40 AM Shade Flood, MD [facility]

## 2022-07-04 MED ORDER — ONETOUCH VERIO VI STRP: 1.0000 | ORAL_STRIP | Freq: Every day | 12 refills | Status: AC

## 2022-07-04 MED ORDER — THYROID 90 MG PO TABS: 90.0000 mg | ORAL_TABLET | Freq: Every day | ORAL | 3 refills | Status: DC

## 2022-07-04 MED ORDER — ONETOUCH VERIO W/DEVICE KIT: 1.0000 | PACK | Freq: Every day | 0 refills | Status: AC

## 2022-07-04 MED ORDER — SEMAGLUTIDE(0.25 OR 0.5MG/DOS) 2 MG/3ML ~~LOC~~ SOPN: 0.5000 mg | PEN_INJECTOR | SUBCUTANEOUS | 3 refills | Status: DC

## 2022-07-06 ENCOUNTER — Telehealth: Payer: Self-pay

## 2022-07-06 ENCOUNTER — Other Ambulatory Visit (HOSPITAL_COMMUNITY): Payer: Self-pay

## 2022-07-06 NOTE — Telephone Encounter (Signed)
Per clinical questions:   The PA will be denied since she has not tried metformin  PA not submitted

## 2022-07-06 NOTE — Telephone Encounter (Signed)
Patient Advocate Encounter   Received notification from [facility] that prior authorization is required for Ozempic  Submitted: 07/06/22 [facility]  Waiting for clinical questions to populate

## 2022-07-09 MED ORDER — METFORMIN HCL ER 500 MG PO TB24: 500.0000 mg | ORAL_TABLET | Freq: Two times a day (BID) | ORAL | 3 refills | Status: DC

## 2022-07-09 NOTE — Addendum Note (Signed)
Addended by: Scarlette Shorts on: 07/09/2022 10:18 AM   Modules accepted: Orders

## 2022-07-10 ENCOUNTER — Other Ambulatory Visit: Payer: BC Managed Care – PPO

## 2022-07-10 DIAGNOSIS — E876 Hypokalemia: Secondary | ICD-10-CM | POA: Diagnosis not present

## 2022-07-10 LAB — ALDOSTERONE + RENIN ACTIVITY W/ RATIO: Renin Activity, Plasma: 1.981 ng/mL/hr (ref 0.167–5.380)

## 2022-07-12 LAB — ALDOSTERONE + RENIN ACTIVITY W/ RATIO: Aldosterone: <1 ng/dL (ref 0.0–30.0)

## 2022-07-13 ENCOUNTER — Other Ambulatory Visit: Payer: Self-pay | Admitting: Family Medicine

## 2022-07-13 DIAGNOSIS — G4700 Insomnia, unspecified: Secondary | ICD-10-CM

## 2022-07-13 NOTE — Telephone Encounter (Signed)
Temazepam 15 mg LOV: 05/03/22 Last Refill:06/05/22 Upcoming appt: 09/13/22

## 2022-07-14 NOTE — Telephone Encounter (Signed)
Medication discussed at her February 22 office visit.  Controlled substance database reviewed.  Restoril No. 30 last filled 06/08/2022, refill ordered.

## 2022-07-18 ENCOUNTER — Ambulatory Visit: Payer: BC Managed Care – PPO | Admitting: Internal Medicine

## 2022-07-18 LAB — CORTISOL, URINE, 24 HOUR
24 Hour urine volume (VMAHVA): 1800 mL
CREATININE, URINE: 1.16 g/(24.h) (ref 0.50–2.15)
Cortisol (Ur), Free: 10.7 mcg/24 h (ref 4.0–50.0)

## 2022-08-13 ENCOUNTER — Other Ambulatory Visit: Payer: Self-pay | Admitting: Family Medicine

## 2022-08-13 DIAGNOSIS — G4700 Insomnia, unspecified: Secondary | ICD-10-CM

## 2022-08-14 NOTE — Telephone Encounter (Signed)
Temazepam 15 mg LOV: 05/03/22 Last Refill:07/14/22 Upcoming appt: 09/13/22

## 2022-08-14 NOTE — Telephone Encounter (Signed)
Insomnia discussed at her physical in February.  Controlled substance database reviewed.  Temazepam 15 mg #30 last filled on 07/14/2022, refill ordered.

## 2022-08-22 ENCOUNTER — Encounter (INDEPENDENT_AMBULATORY_CARE_PROVIDER_SITE_OTHER): Payer: Self-pay

## 2022-09-13 ENCOUNTER — Encounter: Payer: Self-pay | Admitting: Family Medicine

## 2022-09-13 ENCOUNTER — Ambulatory Visit: Payer: BC Managed Care – PPO | Admitting: Family Medicine

## 2022-09-13 VITALS — BP 124/70 | HR 89 | Temp 97.9°F | Ht 60.0 in | Wt 188.6 lb

## 2022-09-13 DIAGNOSIS — F330 Major depressive disorder, recurrent, mild: Secondary | ICD-10-CM

## 2022-09-13 DIAGNOSIS — M545 Low back pain, unspecified: Secondary | ICD-10-CM

## 2022-09-13 DIAGNOSIS — I10 Essential (primary) hypertension: Secondary | ICD-10-CM

## 2022-09-13 DIAGNOSIS — E119 Type 2 diabetes mellitus without complications: Secondary | ICD-10-CM | POA: Diagnosis not present

## 2022-09-13 DIAGNOSIS — R110 Nausea: Secondary | ICD-10-CM | POA: Diagnosis not present

## 2022-09-13 DIAGNOSIS — G4700 Insomnia, unspecified: Secondary | ICD-10-CM

## 2022-09-13 MED ORDER — TEMAZEPAM 15 MG PO CAPS
ORAL_CAPSULE | ORAL | 0 refills | Status: DC
Start: 2022-09-13 — End: 2022-10-17

## 2022-09-13 MED ORDER — ONDANSETRON HCL 4 MG PO TABS: 4.0000 mg | ORAL_TABLET | Freq: Three times a day (TID) | ORAL | 0 refills | Status: AC | PRN

## 2022-09-13 NOTE — Progress Notes (Signed)
Subjective:  Patient ID: Beverly Atkinson, female    DOB: 1980/09/21  Age: 42 y.o. MRN: [id number]  CC:  Chief Complaint  Patient presents with   Medical Management of Chronic Issues    HPI Beverly Atkinson presents for   Hypertension: With history of heart murmur, left bundle branch block, has been seen by cardiology, Dr. Cristal Deer.  Treated with valsartan 80 mg daily.  Home readings: 120-130/70-80.  BP Readings from Last 3 Encounters:  09/13/22 124/70  07/03/22 118/74  05/03/22 138/88   Lab Results  Component Value Date   CREATININE 0.86 05/03/2022   Diabetes with history of prediabetes. Treated by endocrinology, along with treatment for Hashimoto's thyroiditis, hypothyroidism.  Dr. Lonzo Cloud, appointment in June. Started on Ozempic, but that was denied - now on metformin BID. Cut back to less than 1 can soda per week.  20-month follow-up planned. Intermittent nausea with endometriosis and metformin - prior rx from 04/2019.  Lab Results  Component Value Date   HGBA1C 6.8 (H) 07/03/2022   Wt Readings from Last 3 Encounters:  09/13/22 188 lb 9.6 oz (85.5 kg)  07/03/22 191 lb (86.6 kg)  05/03/22 190 lb 6.4 oz (86.4 kg)   Depression with insomnia See prior notes, multiple previous attempts at treatment including trazodone, Elavil, Lunesta, Ambien, Seroquel.  Depression has been treated with sertraline 100 mg daily which improved depression symptoms and anhedonia and Restoril has been effective for sleep at 15 mg without new parasomnias or daytime sedation or new side effects.  Meds are currently working well, no new side effects.  Controlled substance database reviewed. Last filled 08/14/22 - needs refill.   Endometriosis pain: Rare need for tramadol, # 15 Rx in 222/24 - 1.5 pills left. Occasional back pain that is thought to be endometriosis.  Has seen chiropractor, exercises help.   History Patient Active Problem List   Diagnosis Date Noted   Constipation 08/21/2019    Hyperlipidemia 07/15/2018   Pain management contract signed 09/27/2017   Opiate dependence (HCC) 09/27/2017   Polycystic ovaries 06/18/2017   History of endometriosis 06/18/2017   Hypothyroidism due to Hashimoto's thyroiditis 06/18/2017   Essential hypertension 06/13/2017   Hashimoto's thyroiditis 03/18/2017   Primary hypothyroidism 03/18/2017   LBBB (left bundle branch block) 03/18/2017   Chronic low back pain 03/18/2017   Morbid obesity (HCC) 03/18/2017   Abnormal uterine bleeding (AUB) 08/09/2015   Chronic pelvic pain in female 08/09/2015   Epigastric pain 01/26/2014   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 Authorizing Provider  B Complex Vitamins (VITAMIN B COMPLEX PO) Take by mouth.    [provider]  Blood Glucose Monitoring Suppl (ONETOUCH VERIO) w/Device KIT 1 Device by Does not apply route daily in the afternoon. 07/04/22   Shamleffer, Konrad Dolores, MD  Calcium  Carbonate-Vitamin D 300-100 MG-UNIT CAPS Take by mouth.    [provider]  glucose blood (ONETOUCH VERIO) test strip 1 each by Other route daily in the afternoon. Use as instructed 07/04/22   Shamleffer, Konrad Dolores, MD  metFORMIN (GLUCOPHAGE-XR) 500 MG 24 hr tablet Take 1 tablet (500 mg total) by mouth 2 (two) times daily with a meal. 07/09/22   Shamleffer, Konrad Dolores, MD  Multiple Vitamins-Minerals  (MULTIVITAMIN PO) Take by mouth.    [provider]  NON FORMULARY     [provider]  norethindrone (AYGESTIN) 5 MG tablet Take 5 mg by mouth daily.    [provider]  ondansetron (ZOFRAN ODT) 4 MG disintegrating tablet Take 1 tablet (4 mg total) by mouth every 8 (eight) hours as needed for nausea or vomiting. 04/23/19   Jannifer Rodney A, FNP  OVER THE COUNTER MEDICATION Take 1 capsule by mouth daily as needed. [employer] Thyroid complex.    [provider]  sertraline (ZOLOFT) 100 MG tablet Take 1 tablet (100 mg total) by mouth daily. 03/15/22   Shade Flood, MD  temazepam (RESTORIL) 15 MG capsule TAKE 1 CAPSULE BY MOUTH AT BEDTIME AS NEEDED FOR SLEEP 08/14/22   Shade Flood, MD  thyroid (NP THYROID) 90 MG tablet Take 1 tablet (90 mg total) by mouth daily. 07/04/22   Shamleffer, Konrad Dolores, MD  traMADol (ULTRAM) 50 MG tablet TAKE 1 TABLET BY MOUTH EVERY 8 HOURS AS NEEDED FOR UP TO 5 DAYS    [provider]  Turmeric Curcumin 500 MG CAPS Take 1 capsule by mouth daily.    [provider]  valsartan (DIOVAN) 80 MG tablet Take 1 tablet (80 mg total) by mouth daily. 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   Social Determinants of Health   Financial Resource Strain: Not on file  Food Insecurity: Not on file  Transportation Needs: Not on  file  Physical Activity: Not on file  Stress: Not on file  Social Connections: Not on file  Intimate Partner Violence: Not on file    Review of Systems Per HPI  Objective:   Vitals:   09/13/22 1019  BP: 124/70  Pulse: 89  Temp: 97.9 F (36.6 C)  SpO2: 98%  Weight: 188 lb 9.6 oz (85.5 kg)  Height: 5' (1.524 m)     Physical Exam Vitals reviewed.  Constitutional:      Appearance: Normal appearance. She is well-developed.  HENT:     Head: Normocephalic and atraumatic.  Eyes:     Conjunctiva/sclera: Conjunctivae normal.     Pupils: Pupils are equal, round, and reactive to light.  Neck:     Vascular: No carotid bruit.  Cardiovascular:     Rate and Rhythm: Normal rate and  regular rhythm.     Heart sounds: Normal heart sounds.  Pulmonary:     Effort: Pulmonary effort is normal.     Breath sounds: Normal breath sounds.  Abdominal:     Palpations: Abdomen is soft. There is no pulsatile mass.     Tenderness: There is no abdominal tenderness.  Musculoskeletal:     Right lower leg: No edema.     Left lower leg: No edema.  Skin:    General: Skin is warm and dry.  Neurological:     Mental Status: She is alert and oriented to person, place, and time.  Psychiatric:        Mood and Affect: Mood normal.        Behavior: Behavior normal.        Assessment & Plan:  Beverly Atkinson is a 42 y.o. female . Essential hypertension  -Stable with current regimen, continue same.  Type 2 diabetes mellitus without complication, without long-term current use of insulin (HCC)  -Followed by endocrinology, routine follow-up.  No med changes at this time.  Nausea - Plan: ondansetron (ZOFRAN) 4 MG tablet  -Intermittent nausea as above, will refill Zofran, and if that is not covered, can alternate options.  RTC precautions if new or worsening symptoms.  Mild episode of recurrent major depressive disorder (HCC) Insomnia, unspecified type - Plan: temazepam (RESTORIL) 15 MG  capsule  -Reports stable control symptoms with current regimen including Restoril at bedtime, Zoloft for depression symptoms.  Continue same.  Potential side effects and risks of meds have been discussed, and controlled substance database reviewed.  Episodic low back pain  -With history of endometriosis with intermittent need for tramadol.  Refilled temporarily, and plan for follow-up for separate visit to discuss back pain further as that may be separate from endometriosis symptoms.  Meds ordered this encounter  Medications   ondansetron (ZOFRAN) 4 MG tablet    Sig: Take 1 tablet (4 mg total) by mouth every 8 (eight) hours as needed for nausea or vomiting.    Dispense:  20 tablet    Refill:  0   temazepam (RESTORIL) 15 MG capsule    Sig: TAKE 1 CAPSULE BY MOUTH AT BEDTIME AS NEEDED FOR SLEEP    Dispense:  30 capsule    Refill:  0   Patient Instructions  I will refill meds today including the tramadol for now but please follow to discuss back pain in next month.  Return to the clinic or go to the nearest emergency room if any of your symptoms worsen or new symptoms occur.  Take care!       Signed,   Meredith Staggers, MD [facility], [facility] 09/13/22 11:05 AM

## 2022-09-13 NOTE — Patient Instructions (Addendum)
I will refill meds today including the tramadol for now but please follow to discuss back pain in next month.  Return to the clinic or go to the nearest emergency room if any of your symptoms worsen or new symptoms occur.  Take care!

## 2022-09-17 ENCOUNTER — Encounter: Payer: Self-pay | Admitting: Family Medicine

## 2022-09-17 DIAGNOSIS — M545 Low back pain, unspecified: Secondary | ICD-10-CM

## 2022-09-18 MED ORDER — TRAMADOL HCL 50 MG PO TABS
50.0000 mg | ORAL_TABLET | Freq: Four times a day (QID) | ORAL | 0 refills | Status: DC | PRN
Start: 2022-09-18 — End: 2023-05-08

## 2022-09-18 NOTE — Telephone Encounter (Signed)
Controlled substance database reviewed.  Tramadol No. 15 last filled in February.  Refill ordered.

## 2022-10-17 ENCOUNTER — Ambulatory Visit: Payer: BC Managed Care – PPO | Admitting: Family Medicine

## 2022-10-17 ENCOUNTER — Encounter: Payer: Self-pay | Admitting: Family Medicine

## 2022-10-17 VITALS — BP 130/88 | HR 82 | Temp 98.6°F | Wt 189.2 lb

## 2022-10-17 DIAGNOSIS — G8929 Other chronic pain: Secondary | ICD-10-CM

## 2022-10-17 DIAGNOSIS — I10 Essential (primary) hypertension: Secondary | ICD-10-CM | POA: Diagnosis not present

## 2022-10-17 DIAGNOSIS — G4700 Insomnia, unspecified: Secondary | ICD-10-CM

## 2022-10-17 DIAGNOSIS — M545 Low back pain, unspecified: Secondary | ICD-10-CM | POA: Diagnosis not present

## 2022-10-17 DIAGNOSIS — E119 Type 2 diabetes mellitus without complications: Secondary | ICD-10-CM

## 2022-10-17 LAB — LIPID PANEL
Cholesterol: 243 mg/dL — ABNORMAL HIGH (ref 0–200)
HDL: 31.7 mg/dL — ABNORMAL LOW (ref >39.00)
LDL Cholesterol: 176 mg/dL — ABNORMAL HIGH (ref 0–99)
NonHDL: 210.93
Total CHOL/HDL Ratio: 8
Triglycerides: 173 mg/dL — ABNORMAL HIGH (ref 0.0–149.0)
VLDL: 34.6 mg/dL (ref 0.0–40.0)

## 2022-10-17 LAB — COMPREHENSIVE METABOLIC PANEL
ALT: 16 U/L (ref 0–35)
AST: 19 U/L (ref 0–37)
Albumin: 4 g/dL (ref 3.5–5.2)
Alkaline Phosphatase: 46 U/L (ref 39–117)
BUN: 14 mg/dL (ref 6–23)
CO2: 21 mEq/L (ref 19–32)
Calcium: 9.1 mg/dL (ref 8.4–10.5)
Chloride: 108 mEq/L (ref 96–112)
Creatinine, Ser: 0.89 mg/dL (ref 0.40–1.20)
GFR: 79.15 mL/min (ref >60.00)
Glucose, Bld: 80 mg/dL (ref 70–99)
Potassium: 3.5 mEq/L (ref 3.5–5.1)
Sodium: 139 mEq/L (ref 135–145)
Total Bilirubin: 0.6 mg/dL (ref 0.2–1.2)
Total Protein: 7 g/dL (ref 6.0–8.3)

## 2022-10-17 LAB — HEMOGLOBIN A1C: Hgb A1c MFr Bld: 6.1 % (ref 4.6–6.5)

## 2022-10-17 MED ORDER — TEMAZEPAM 15 MG PO CAPS: ORAL_CAPSULE | ORAL | 0 refills | Status: DC

## 2022-10-17 NOTE — Patient Instructions (Addendum)
See information below on chronic back pain.  I will refer you for physical therapy and then 6-week follow-up.  Depending on how things are going can have you meet with a back specialist.  No other new medicines for now.  Tramadol only if needed for more severe pain.Return to the clinic or go to the nearest emergency room if any of your symptoms worsen or new symptoms occur.  I will check labs today and if concerns I will let you know.  Keep follow-up with endocrinology as planned in December.  Restoril refilled today.  Take care  Chronic Back Pain Chronic back pain is back pain that lasts longer than 3 months. The cause of your back pain may not be known. Some common causes include: Wear and tear (degenerative disease) of the bones, disks, or tissues that connect bones to each other (ligaments) in your back. Inflammation and stiffness in your back (arthritis). If you have chronic back pain, you may have times when the pain is more intense (flare-ups). You can also learn to manage the pain with home care. Follow these instructions at home: Watch for any changes in your symptoms. Take these actions to help with your pain: Managing pain and stiffness     If told, put ice on the painful area. You may be told to apply ice for the first 24-48 hours after a flare-up starts. Put ice in a plastic bag. Place a towel between your skin and the bag. Leave the ice on for 20 minutes, 2-3 times per day. If told, apply heat to the affected area as often as told by your health care provider. Use the heat source that your provider recommends, such as a moist heat pack or a heating pad. Place a towel between your skin and the heat source. Leave the heat on for 20-30 minutes. If your skin turns bright red, remove the ice or heat right away to prevent skin damage. The risk of damage is higher if you cannot feel pain, heat, or cold. Try soaking in a warm tub. Activity        Avoid bending and other  activities that make the pain worse. Have good posture when you stand or sit. When you stand, keep your upper back and neck straight, with your shoulders pulled back. Avoid slouching. When you sit, keep your back straight. Relax your shoulders. Do not round your shoulders or pull them backward. Do not sit or stand in one place for too long. Take brief periods of rest during the day. This will reduce your pain. Resting in a lying or standing position is often better than sitting to rest. When you rest for longer periods, mix in some mild activity or stretching between periods of rest. This will help to prevent stiffness and pain. Get regular exercise. Ask your provider what activities are safe for you. You may have to avoid lifting. Ask your provider how much you can safely lift. If you do lift, always use the right technique. This means you should: Bend your knees. Keep the load close to your body. Avoid twisting. Medicines Take over-the-counter and prescription medicines only as told by your provider. You may need to take medicines for pain and inflammation. These may be taken by mouth or put on the skin. You may also be given muscle relaxants. Ask your provider if the medicine prescribed to you: Requires you to avoid driving or using machinery. Can cause constipation. You may need to take these actions to prevent  or treat constipation: Drink enough fluid to keep your pee (urine) pale yellow. Take over-the-counter or prescription medicines. Eat foods that are high in fiber, such as beans, whole grains, and fresh fruits and vegetables. Limit foods that are high in fat and processed sugars, such as fried or sweet foods. General instructions  Sleep on a firm mattress in a comfortable position. Try lying on your side with your knees slightly bent. If you lie on your back, put a pillow under your knees. Do not use any products that contain nicotine or tobacco. These products include cigarettes,  chewing tobacco, and vaping devices, such as e-cigarettes. If you need help quitting, ask your provider. Contact a health care provider if: You have pain that does not get better with rest or medicine. You have new pain. You have a fever. You lose weight quickly. You have trouble doing your normal activities. You feel weak or numb in one or both of your legs or feet. Get help right away if: You are not able to control when you pee or poop. You have severe back pain and: Nausea or vomiting. Pain in your chest or abdomen. Shortness of breath. You faint. These symptoms may be an emergency. Get help right away. Call 911. Do not wait to see if the symptoms will go away. Do not drive yourself to the hospital. This information is not intended to replace advice given to you by your health care provider. Make sure you discuss any questions you have with your health care provider. Document Revised: 08/28/2021 Document Reviewed: 08/28/2021 Elsevier Patient Education  2024 [employer].
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
## 2022-10-26 ENCOUNTER — Encounter: Payer: Self-pay | Admitting: Family Medicine

## 2022-10-26 DIAGNOSIS — E119 Type 2 diabetes mellitus without complications: Secondary | ICD-10-CM

## 2022-10-26 DIAGNOSIS — E1169 Type 2 diabetes mellitus with other specified complication: Secondary | ICD-10-CM

## 2022-10-26 MED ORDER — ATORVASTATIN CALCIUM 10 MG PO TABS
10.0000 mg | ORAL_TABLET | Freq: Every day | ORAL | 1 refills | Status: DC
Start: 2022-10-26 — End: 2022-11-12

## 2022-10-26 NOTE — Addendum Note (Signed)
Addended by: Meredith Staggers R on: 10/26/2022 02:35 PM   Modules accepted: Orders

## 2022-11-01 DIAGNOSIS — G8929 Other chronic pain: Secondary | ICD-10-CM | POA: Diagnosis not present

## 2022-11-01 DIAGNOSIS — E119 Type 2 diabetes mellitus without complications: Secondary | ICD-10-CM | POA: Diagnosis not present

## 2022-11-01 DIAGNOSIS — G4700 Insomnia, unspecified: Secondary | ICD-10-CM | POA: Diagnosis not present

## 2022-11-01 DIAGNOSIS — M5459 Other low back pain: Secondary | ICD-10-CM | POA: Diagnosis not present

## 2022-11-06 DIAGNOSIS — G4700 Insomnia, unspecified: Secondary | ICD-10-CM | POA: Diagnosis not present

## 2022-11-06 DIAGNOSIS — G8929 Other chronic pain: Secondary | ICD-10-CM | POA: Diagnosis not present

## 2022-11-06 DIAGNOSIS — M5459 Other low back pain: Secondary | ICD-10-CM | POA: Diagnosis not present

## 2022-11-06 DIAGNOSIS — E119 Type 2 diabetes mellitus without complications: Secondary | ICD-10-CM | POA: Diagnosis not present

## 2022-11-07 ENCOUNTER — Telehealth: Payer: Self-pay | Admitting: Family Medicine

## 2022-11-07 NOTE — Telephone Encounter (Signed)
Paperwork completed and placed in fax bin at back nurse station

## 2022-11-07 NOTE — Telephone Encounter (Signed)
Placed in sign folder.

## 2022-11-07 NOTE — Telephone Encounter (Signed)
Home Health Verbal Orders  Agency: [facility]   Caller: Nathaly Call back #: [phone] Fax #:[phone]    Requesting PT:    Charge sheet attached and place in front bin   [facility] needs F2F w/in last 30 days

## 2022-11-08 DIAGNOSIS — M5459 Other low back pain: Secondary | ICD-10-CM | POA: Diagnosis not present

## 2022-11-08 DIAGNOSIS — G4700 Insomnia, unspecified: Secondary | ICD-10-CM | POA: Diagnosis not present

## 2022-11-08 DIAGNOSIS — G8929 Other chronic pain: Secondary | ICD-10-CM | POA: Diagnosis not present

## 2022-11-08 DIAGNOSIS — E119 Type 2 diabetes mellitus without complications: Secondary | ICD-10-CM | POA: Diagnosis not present

## 2022-11-09 ENCOUNTER — Other Ambulatory Visit (HOSPITAL_BASED_OUTPATIENT_CLINIC_OR_DEPARTMENT_OTHER): Payer: Self-pay | Admitting: Cardiology

## 2022-11-09 DIAGNOSIS — I10 Essential (primary) hypertension: Secondary | ICD-10-CM

## 2022-11-12 ENCOUNTER — Telehealth: Payer: Self-pay

## 2022-11-12 ENCOUNTER — Other Ambulatory Visit: Payer: Self-pay

## 2022-11-12 DIAGNOSIS — E1169 Type 2 diabetes mellitus with other specified complication: Secondary | ICD-10-CM

## 2022-11-12 DIAGNOSIS — G4700 Insomnia, unspecified: Secondary | ICD-10-CM

## 2022-11-12 MED ORDER — ATORVASTATIN CALCIUM 10 MG PO TABS
10.0000 mg | ORAL_TABLET | Freq: Every day | ORAL | 1 refills | Status: DC
Start: 2022-11-12 — End: 2023-01-04

## 2022-11-12 NOTE — Telephone Encounter (Signed)
Please advise 

## 2022-11-12 NOTE — Telephone Encounter (Signed)
PT HAS BEEN NOTIFIED.

## 2022-11-13 DIAGNOSIS — G8929 Other chronic pain: Secondary | ICD-10-CM | POA: Diagnosis not present

## 2022-11-13 DIAGNOSIS — M5459 Other low back pain: Secondary | ICD-10-CM | POA: Diagnosis not present

## 2022-11-13 DIAGNOSIS — G4700 Insomnia, unspecified: Secondary | ICD-10-CM | POA: Diagnosis not present

## 2022-11-13 DIAGNOSIS — E119 Type 2 diabetes mellitus without complications: Secondary | ICD-10-CM | POA: Diagnosis not present

## 2022-11-13 MED ORDER — TEMAZEPAM 15 MG PO CAPS
ORAL_CAPSULE | ORAL | 0 refills | Status: DC
Start: 2022-11-13 — End: 2022-12-10

## 2022-11-13 NOTE — Addendum Note (Signed)
Addended by: Meredith Staggers R on: 11/13/2022 07:48 AM   Modules accepted: Orders

## 2022-11-13 NOTE — Telephone Encounter (Signed)
Medication discussed at August 22 visit.  Controlled substance database reviewed.  Temazepam No. 30 last filled on 10/17/2022.  Refill ordered.

## 2022-11-15 DIAGNOSIS — G8929 Other chronic pain: Secondary | ICD-10-CM | POA: Diagnosis not present

## 2022-11-15 DIAGNOSIS — G4700 Insomnia, unspecified: Secondary | ICD-10-CM | POA: Diagnosis not present

## 2022-11-15 DIAGNOSIS — M5459 Other low back pain: Secondary | ICD-10-CM | POA: Diagnosis not present

## 2022-11-15 DIAGNOSIS — E119 Type 2 diabetes mellitus without complications: Secondary | ICD-10-CM | POA: Diagnosis not present

## 2022-11-20 DIAGNOSIS — G8929 Other chronic pain: Secondary | ICD-10-CM | POA: Diagnosis not present

## 2022-11-20 DIAGNOSIS — E119 Type 2 diabetes mellitus without complications: Secondary | ICD-10-CM | POA: Diagnosis not present

## 2022-11-20 DIAGNOSIS — G4700 Insomnia, unspecified: Secondary | ICD-10-CM | POA: Diagnosis not present

## 2022-11-20 DIAGNOSIS — M5459 Other low back pain: Secondary | ICD-10-CM | POA: Diagnosis not present

## 2022-11-22 DIAGNOSIS — M5459 Other low back pain: Secondary | ICD-10-CM | POA: Diagnosis not present

## 2022-11-22 DIAGNOSIS — G8929 Other chronic pain: Secondary | ICD-10-CM | POA: Diagnosis not present

## 2022-11-22 DIAGNOSIS — G4700 Insomnia, unspecified: Secondary | ICD-10-CM | POA: Diagnosis not present

## 2022-11-22 DIAGNOSIS — E119 Type 2 diabetes mellitus without complications: Secondary | ICD-10-CM | POA: Diagnosis not present

## 2022-11-27 DIAGNOSIS — M5459 Other low back pain: Secondary | ICD-10-CM | POA: Diagnosis not present

## 2022-11-27 DIAGNOSIS — G4700 Insomnia, unspecified: Secondary | ICD-10-CM | POA: Diagnosis not present

## 2022-11-27 DIAGNOSIS — E119 Type 2 diabetes mellitus without complications: Secondary | ICD-10-CM | POA: Diagnosis not present

## 2022-11-27 DIAGNOSIS — G8929 Other chronic pain: Secondary | ICD-10-CM | POA: Diagnosis not present

## 2022-11-28 ENCOUNTER — Ambulatory Visit: Payer: BC Managed Care – PPO | Admitting: Family Medicine

## 2022-11-29 DIAGNOSIS — M5459 Other low back pain: Secondary | ICD-10-CM | POA: Diagnosis not present

## 2022-11-29 DIAGNOSIS — G8929 Other chronic pain: Secondary | ICD-10-CM | POA: Diagnosis not present

## 2022-11-29 DIAGNOSIS — G4700 Insomnia, unspecified: Secondary | ICD-10-CM | POA: Diagnosis not present

## 2022-11-29 DIAGNOSIS — E119 Type 2 diabetes mellitus without complications: Secondary | ICD-10-CM | POA: Diagnosis not present

## 2022-12-05 DIAGNOSIS — E119 Type 2 diabetes mellitus without complications: Secondary | ICD-10-CM | POA: Diagnosis not present

## 2022-12-05 DIAGNOSIS — G8929 Other chronic pain: Secondary | ICD-10-CM | POA: Diagnosis not present

## 2022-12-05 DIAGNOSIS — G4700 Insomnia, unspecified: Secondary | ICD-10-CM | POA: Diagnosis not present

## 2022-12-05 DIAGNOSIS — M5459 Other low back pain: Secondary | ICD-10-CM | POA: Diagnosis not present

## 2022-12-10 ENCOUNTER — Ambulatory Visit: Payer: BC Managed Care – PPO | Admitting: Family Medicine

## 2022-12-10 VITALS — BP 114/70 | HR 77 | Temp 98.1°F | Ht 60.0 in | Wt 186.2 lb

## 2022-12-10 DIAGNOSIS — E1169 Type 2 diabetes mellitus with other specified complication: Secondary | ICD-10-CM | POA: Diagnosis not present

## 2022-12-10 DIAGNOSIS — M545 Low back pain, unspecified: Secondary | ICD-10-CM | POA: Diagnosis not present

## 2022-12-10 DIAGNOSIS — G4700 Insomnia, unspecified: Secondary | ICD-10-CM | POA: Diagnosis not present

## 2022-12-10 DIAGNOSIS — G8929 Other chronic pain: Secondary | ICD-10-CM | POA: Diagnosis not present

## 2022-12-10 DIAGNOSIS — E785 Hyperlipidemia, unspecified: Secondary | ICD-10-CM

## 2022-12-10 MED ORDER — TEMAZEPAM 15 MG PO CAPS: ORAL_CAPSULE | ORAL | 0 refills | Status: DC

## 2022-12-10 NOTE — Patient Instructions (Signed)
Glad to hear that the back pain is improved.  Keep up with the home exercises.  If any worsening of that pain or flares, let me know.  Occasional tramadol if you do have a flare is reasonable but try to minimize use of that medication with use of temazepam.  I will send in a refill of that medication.  Follow-up in 1 month for a lab only visit and we can check to be cholesterol levels at that time to decide on changes.  Daily dosing is reasonable for now, if any new side effects let me know.  41-month follow-up with me for medication check.  Take care!

## 2022-12-10 NOTE — Progress Notes (Signed)
Subjective:  Patient ID: Beverly Atkinson, female    DOB: 01-03-1981  Age: 42 y.o. MRN: [id number]  CC:  Chief Complaint  Patient presents with   Back Pain    Pt notes she is improving no concern questions notes she is happy with the improvement     HPI Beverly Atkinson presents for   Bilateral low back pain Discussed in September with chronic low back pain at that time.  Intermittent pain, initially thought to be due to endometriosis, has been seen by chiropractor in the past.  Previous home exercises and stretches from chiropractor.  When discussed in September had not yet tried physical therapy.  She was using tramadol intermittently to help with pain when worse, few times per week.  Typically Tylenol and ibuprofen and topical Aspercreme or Biofreeze.  Suspected mechanical back pain without red flags on exam or history.  She was referred for a trial of physical therapy, Tylenol, ibuprofen okay temporarily along with tramadol as needed for more severe pain.  Side effects discussed including with use of chronic Restoril for sleep.  [facility] 4 weeks, then  eval - transitioned to HEP. Rang of motion improved as well as pain. Only minimal here and there. Only needed tramadol once, not needing ibuprofen and tylenol as much.   Hyperlipidemia: Recently started Lipitor August 4 after previous elevated readings, family history of heart disease - father with heart disease.  Started lipitor on 10/21, every other day. Off for a few days during GI illness last week.  No side effects with statin.  Lab Results  Component Value Date   CHOL 243 (H) 10/17/2022   HDL 31.70 (L) 10/17/2022   LDLCALC 176 (H) 10/17/2022   TRIG 173.0 (H) 10/17/2022   CHOLHDL 8 10/17/2022   Lab Results  Component Value Date   ALT 16 10/17/2022   AST 19 10/17/2022   ALKPHOS 46 10/17/2022   BILITOT 0.6 10/17/2022   Insomnia Discussed in September. No new side effects. Controlled substance database (PDMP) reviewed. No  concerns appreciated. Last filled 11/14/22. Request refill.    History Patient Active Problem List   Diagnosis Date Noted   Constipation 08/21/2019   Hyperlipidemia 07/15/2018   Pain management contract signed 09/27/2017   Opiate dependence (HCC) 09/27/2017   Polycystic ovaries 06/18/2017   History of endometriosis 06/18/2017   Hypothyroidism due to Hashimoto's thyroiditis 06/18/2017   Essential hypertension 06/13/2017   Hashimoto's thyroiditis 03/18/2017   Primary hypothyroidism 03/18/2017   LBBB (left bundle branch block) 03/18/2017   Chronic low back pain 03/18/2017   Morbid obesity (HCC) 03/18/2017   Abnormal uterine bleeding (AUB) 08/09/2015   Chronic pelvic pain in female 08/09/2015   Epigastric pain 01/26/2014   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 Authorizing Provider  atorvastatin (  LIPITOR) 10 MG tablet Take 1 tablet (10 mg total) by mouth daily. 11/12/22   Shade Flood, MD  B Complex Vitamins (VITAMIN B COMPLEX PO) Take by mouth.    [provider]  Blood Glucose Monitoring Suppl (ONETOUCH VERIO) w/Device KIT 1 Device by Does not apply route daily in the afternoon. 07/04/22   Shamleffer, Konrad Dolores, MD  Calcium Carbonate-Vitamin D 300-100 MG-UNIT CAPS Take by mouth.    [provider]  glucose blood (ONETOUCH VERIO) test strip 1 each by Other route  daily in the afternoon. Use as instructed 07/04/22   Shamleffer, Konrad Dolores, MD  metFORMIN (GLUCOPHAGE-XR) 500 MG 24 hr tablet Take 1 tablet (500 mg total) by mouth 2 (two) times daily with a meal. 07/09/22   Shamleffer, Konrad Dolores, MD  Multiple Vitamins-Minerals (MULTIVITAMIN PO) Take by mouth.    [provider]  NON FORMULARY     [provider]  norethindrone (AYGESTIN) 5 MG tablet Take 5 mg by mouth daily.    [provider]  ondansetron (ZOFRAN) 4 MG tablet Take 1 tablet (4 mg total) by mouth every 8 (eight) hours as needed for nausea or vomiting. 09/13/22   Shade Flood, MD  OVER THE COUNTER MEDICATION Take 1 capsule by mouth daily as needed. The Vitamin shoppe Thyroid complex.    [provider]  sertraline (ZOLOFT) 100 MG tablet Take 1 tablet (100 mg total) by mouth daily. 03/15/22   Shade Flood, MD  temazepam (RESTORIL) 15 MG capsule TAKE 1 CAPSULE BY MOUTH AT BEDTIME AS NEEDED FOR SLEEP 11/13/22   Shade Flood, MD  thyroid (NP THYROID) 90 MG tablet Take 1 tablet (90 mg total) by mouth daily. 07/04/22   Shamleffer, Konrad Dolores, MD  traMADol (ULTRAM) 50 MG tablet Take 1 tablet (50 mg total) by mouth every 6 (six) hours as needed. 09/18/22   Shade Flood, MD  Turmeric Curcumin 500 MG CAPS Take 1 capsule by mouth daily.    [provider]  valsartan (DIOVAN) 80 MG tablet Take 1 tablet by mouth once daily 11/09/22   Jodelle Red, MD   Social History   Socioeconomic History   Marital status: Single    Spouse name: Not on file   Number of children: Not on file   Years of education: Not on file   Highest education level: Associate degree: occupational, Scientist, product/process development, or vocational program  Occupational History   Not on file  Tobacco Use   Smoking status: Never   Smokeless tobacco: Never  Vaping Use   Vaping status: Never Used  Substance and Sexual Activity   Alcohol use: Yes    Comment: occ   Drug use:  No   Sexual activity: Not Currently  Other Topics Concern   Not on file  Social History Narrative   Single,lives alone.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   Social Determinants of Health   Financial Resource Strain: Low Risk  (12/10/2022)   Overall Financial Resource Strain (CARDIA)    Difficulty of Paying Living Expenses: Not very hard  Food Insecurity: No Food Insecurity (12/10/2022)   Hunger Vital Sign    Worried About Running Out of Food in the Last Year: Never true    Ran Out of Food in the Last Year: Never true  Transportation Needs: No Transportation Needs (12/10/2022)   PRAPARE - Administrator, Civil Service (Medical): No    Lack of Transportation (Non-Medical): No  Physical Activity:  Insufficiently Active (12/10/2022)   Exercise Vital Sign    Days of Exercise per Week: 2 days    Minutes of Exercise per Session: 20 min  Stress: No Stress Concern Present (12/10/2022)   [employer] of Occupational Health - Occupational Stress Questionnaire    Feeling of Stress : Only a little  Social Connections: Moderately Isolated (12/10/2022)   Social Connection and Isolation Panel [NHANES]    Frequency of Communication with Friends and Family: More than three times a week    Frequency of Social Gatherings with Friends and Family: Three times a week    Attends Religious Services: More than 4 times per year    Active Member of Clubs or Organizations: No    Attends Banker Meetings: Not on file    Marital Status: Never married  Intimate Partner Violence: Not on file    Review of Systems Per HPI  Objective:   Vitals:   12/10/22 1330  BP: 114/70  Pulse: 77  Temp: 98.1 F (36.7 C)  TempSrc: Temporal  SpO2: 99%  Weight: 186 lb 3.2 oz (84.5 kg)  Height: 5' (1.524 m)     Physical Exam Vitals reviewed.  Constitutional:      Appearance: Normal appearance. She is well-developed.  HENT:     Head: Normocephalic and atraumatic.  Eyes:      Conjunctiva/sclera: Conjunctivae normal.     Pupils: Pupils are equal, round, and reactive to light.  Neck:     Vascular: No carotid bruit.  Cardiovascular:     Rate and Rhythm: Normal rate and regular rhythm.     Heart sounds: Normal heart sounds.  Pulmonary:     Effort: Pulmonary effort is normal.     Breath sounds: Normal breath sounds.  Abdominal:     Palpations: Abdomen is soft. There is no pulsatile mass.     Tenderness: There is no abdominal tenderness.  Musculoskeletal:     Right lower leg: No edema.     Left lower leg: No edema.     Comments: Lumbar spine, no midline bony tenderness, full range of motion.  Ambulating without assistive device.  Pain-free range of motion.  Skin:    General: Skin is warm and dry.  Neurological:     Mental Status: She is alert and oriented to person, place, and time.  Psychiatric:        Mood and Affect: Mood normal.        Behavior: Behavior normal.        Assessment & Plan:  Beverly Atkinson is a 42 y.o. female . Hyperlipidemia associated with type 2 diabetes mellitus (HCC) - Plan: Comprehensive metabolic panel, Lipid panel  -Tolerating every other day dosing of statin.  Plans on starting daily dosing, and will return for lab only visit in 1 month, 30-month follow-up if stable.  Depending on labs, would consider higher dosing given prior LDL and diabetes.  Insomnia, unspecified type - Plan: temazepam (RESTORIL) 15 MG capsule  Stable with use of Restoril, refilled.  64-month follow-up.  Chronic bilateral low back pain, unspecified whether sciatica present  -Improved after physical therapy.  No recent need for tramadol.  Has if needed for flares but anticipate continued improvement with home exercise program.  RTC precautions.  Meds ordered this encounter  Medications   temazepam (RESTORIL) 15 MG capsule    Sig: TAKE 1 CAPSULE BY MOUTH AT BEDTIME AS NEEDED FOR SLEEP    Dispense:  30 capsule    Refill:  0   Patient Instructions   Glad to hear that the back pain is improved.  Keep up with the home exercises.  If any worsening of that pain or flares, let me know.  Occasional tramadol if you do have a flare is reasonable but try to minimize use of that medication with use of temazepam.  I will send in a refill of that medication.  Follow-up in 1 month for a lab only visit and we can check to be cholesterol levels at that time to decide on changes.  Daily dosing is reasonable for now, if any new side effects let me know.  64-month follow-up with me for medication check.  Take care!    Signed,   Meredith Staggers, MD [facility], [facility] 12/10/22 2:26 PM

## 2022-12-25 ENCOUNTER — Encounter: Payer: Self-pay | Admitting: Family Medicine

## 2022-12-31 ENCOUNTER — Ambulatory Visit: Payer: BC Managed Care – PPO | Admitting: Internal Medicine

## 2022-12-31 NOTE — Progress Notes (Unsigned)
Name: Beverly Atkinson  MRN/ DOB: [id number], [birth year]    Age/ Sex: 42 y.o., female    PCP: Shade Flood, Beverly Atkinson   Reason for Endocrinology Evaluation: Hypothyroidism     Date of Initial Endocrinology Evaluation: 01/01/2022    HPI: Beverly Atkinson is a 42 y.o. female with a past medical history of Hypothyroidism . The patient presented for initial endocrinology clinic visit on 01/01/2022  for consultative assistance with her Hypothyroidism   She was diagnosed with hypothyroidism  at ~ age 71. She was on Synthroid but due to insurance coverage , this was switched to levothyroxine but her TSH was difficult to control    She was started on NP thyroid through [place] ~ a couple of  years    Patient has been noted with low TSH since 2019 with a nadir of 0.00 uIU/mL in January 2023   Weight stable  Has noted local neck swelling  Denies XRT  She is on Thyroid  Complex that she takes every other day and it makes her feel better    Mother and paternal grandmother with thyroid disease    Of note the patient is on NP thyroid 90 mg daily    Of note, the pt was diagnosed with PCOS in the past. She has tried multiple COC's without much help . She has been focusing on diet and life style changes .  She follows with GYN.  She gets menstruations monthly (more every 3 weeks) She has also been diagnosed with pre-diabetes , last A1c 6.0 %    SUBJECTIVE:    Today (12/31/22):  Beverly Atkinson is here for a follow up on Hypothyroidism, pre-diabetes and   She drinks sugar-sweetened beverages , she is intolerant aspartame  She does snack  She is trying to get back to the exercise   Denies local neck swelling  She has been running low grade fever ~ 100 with recent work up negative for infection  She had  constipation but this resolved  , no diarrhea   She is on norethindrone through Gyn with endometriosis pain  Denies acne or hirsutism      NP thyroid 90 mg daily     HISTORY:   Past Medical History:  Past Medical History:  Diagnosis Date   Arthritis    Depression    Heart murmur    Insomnia    Restless leg    Thyroid disease    Past Surgical History:  Past Surgical History:  Procedure Laterality Date   CHOLECYSTECTOMY     2015   EYE SURGERY     Lazy eye at age 37   LAPAROSCOPIC ENDOMETRIOSIS FULGURATION  08/23/2015    Social History:  reports that she has never smoked. She has never used smokeless tobacco. She reports current alcohol use. She reports that she does not use drugs. Family History: family history includes Alcohol abuse in her father and mother; Arthritis in her father and paternal grandmother; Cancer in her father; Drug abuse in her father, mother, and sister; Heart disease in her father; Hyperlipidemia in her father; Hypertension in her father; Mental illness in her mother; Thyroid disease in her mother.   HOME MEDICATIONS: Allergies as of 12/31/2022       Reactions   Ace Inhibitors Cough   Clarithromycin Rash        Medication List        Accurate as of December 31, 2022  8:44 AM. If you have any questions,  ask your nurse or doctor.          atorvastatin 10 MG tablet Commonly known as: LIPITOR Take 1 tablet (10 mg total) by mouth daily.   Calcium Carbonate-Vitamin D 300-100 MG-UNIT Caps Take by mouth.   metFORMIN 500 MG 24 hr tablet Commonly known as: GLUCOPHAGE-XR Take 1 tablet (500 mg total) by mouth 2 (two) times daily with a meal.   MULTIVITAMIN PO Take by mouth.   NON FORMULARY   norethindrone 5 MG tablet Commonly known as: AYGESTIN Take 5 mg by mouth daily.   ondansetron 4 MG tablet Commonly known as: Zofran Take 1 tablet (4 mg total) by mouth every 8 (eight) hours as needed for nausea or vomiting.   OneTouch Verio test strip Generic drug: glucose blood 1 each by Other route daily in the afternoon. Use as instructed   OneTouch Verio w/Device Kit 1 Device by Does not apply route daily in the  afternoon.   OVER THE COUNTER MEDICATION Take 1 capsule by mouth daily as needed. The Vitamin shoppe Thyroid complex.   sertraline 100 MG tablet Commonly known as: ZOLOFT Take 1 tablet (100 mg total) by mouth daily.   temazepam 15 MG capsule Commonly known as: RESTORIL TAKE 1 CAPSULE BY MOUTH AT BEDTIME AS NEEDED FOR SLEEP   thyroid 90 MG tablet Commonly known as: NP Thyroid Take 1 tablet (90 mg total) by mouth daily.   traMADol 50 MG tablet Commonly known as: ULTRAM Take 1 tablet (50 mg total) by mouth every 6 (six) hours as needed.   Turmeric Curcumin 500 MG Caps Take 1 capsule by mouth daily.   valsartan 80 MG tablet Commonly known as: DIOVAN Take 1 tablet by mouth once daily   VITAMIN B COMPLEX PO Take by mouth.          REVIEW OF SYSTEMS: A comprehensive ROS was conducted with the patient and is negative except as per HPI     OBJECTIVE:  VS: There were no vitals taken for this visit.   Wt Readings from Last 3 Encounters:  12/10/22 186 lb 3.2 oz (84.5 kg)  10/17/22 189 lb 3.2 oz (85.8 kg)  09/13/22 188 lb 9.6 oz (85.5 kg)     EXAM: General: Pt appears well and is in NAD  Eyes: External eye exam normal without stare, lid lag or exophthalmos.  EOM intact.  PERRL.  Neck: General: Supple without adenopathy. Thyroid: Thyroid size normal.  No goiter or nodules appreciated. No thyroid bruit.  Lungs: Clear with good BS bilat with no rales, rhonchi, or wheezes  Heart: Auscultation: RRR.  Abdomen: Normoactive bowel sounds, soft, nontender, without masses or organomegaly palpable  Extremities:  BL LE: No pretibial edema normal ROM and strength.  Mental Status: Judgment, insight: Intact Orientation: Oriented to time, place, and person Mood and affect: No depression, anxiety, or agitation     DATA REVIEWED:  Latest Reference Range & Units 07/03/22 12:22  Potassium 3.5 - 5.1 mEq/L 4.0      Latest Reference Range & Units 07/03/22 12:22  Hemoglobin A1C 4.6  - 6.5 % 6.8 (H)  TSH 0.35 - 5.50 uIU/mL 2.41  (H): Data is abnormally high   Latest Reference Range & Units Most Recent  Thyroperoxidase Ab SerPl-aCnc 0 - 34 IU/mL 120 (H) 03/18/17 10:16     ASSESSMENT/PLAN/RECOMMENDATIONS:   Hashimoto's thyroiditis :  -Patient is clinically euthyroid -TSH normal  Medications : NP thyroid 90 mg daily  2. PCOS :  -She has  tried multiple COC's without benefit, she follows with gynecology -Historically she has had normal testosterone level, no acne no hirsutism She is on norethindrone  3. Newly Diagnosed DM :  -A1c 6.8% -Will start Ozempic, caution against GI side effects -One Touch Verio was sent to the pharmacy -Patient to check daily if possible, or at least 2-3 times a week -Patient offered nurse visit for training on injection technique, she may also check educational videos online or asked the pharmacist for help -I have recommended low-carb diet    4. Hypokalemia   -Potassium is normal on today's lab -Will proceed with 24-hour urinary cortisol, Aldo, and renin  F/U in 6 months    Addendum: Discussed results with the patient on 07/04/2022 at 1620  Signed electronically by: Lyndle Herrlich, Beverly Atkinson  [facility]  [facility] [address] [place] [place], [place] [address] Phone: [phone] FAX: [phone]   CC: Shade Flood, Beverly Atkinson [address] [place] [address] Phone: [phone] Fax: [phone]   Return to Endocrinology clinic as below: Future Appointments  Date Time Provider Department Center  12/31/2022 12:10 PM Beverly Atkinson, Beverly Dolores, Beverly Atkinson [facility] None  01/09/2023 10:00 AM [facility]  05/08/2023 10:00 AM Shade Flood, Beverly Atkinson [facility]

## 2023-01-03 ENCOUNTER — Encounter: Payer: Self-pay | Admitting: Internal Medicine

## 2023-01-03 ENCOUNTER — Ambulatory Visit: Payer: BC Managed Care – PPO | Admitting: Internal Medicine

## 2023-01-03 ENCOUNTER — Encounter: Payer: Self-pay | Admitting: Family Medicine

## 2023-01-03 VITALS — BP 130/82 | HR 74 | Ht 60.0 in | Wt 181.0 lb

## 2023-01-03 DIAGNOSIS — E119 Type 2 diabetes mellitus without complications: Secondary | ICD-10-CM | POA: Diagnosis not present

## 2023-01-03 DIAGNOSIS — R809 Proteinuria, unspecified: Secondary | ICD-10-CM | POA: Diagnosis not present

## 2023-01-03 DIAGNOSIS — E063 Autoimmune thyroiditis: Secondary | ICD-10-CM | POA: Diagnosis not present

## 2023-01-03 DIAGNOSIS — E1129 Type 2 diabetes mellitus with other diabetic kidney complication: Secondary | ICD-10-CM

## 2023-01-03 DIAGNOSIS — Z7984 Long term (current) use of oral hypoglycemic drugs: Secondary | ICD-10-CM | POA: Diagnosis not present

## 2023-01-03 LAB — POCT GLYCOSYLATED HEMOGLOBIN (HGB A1C): Hemoglobin A1C: 5.6 % (ref 4.0–5.6)

## 2023-01-03 MED ORDER — METFORMIN HCL ER 500 MG PO TB24: 500.0000 mg | ORAL_TABLET | Freq: Every day | ORAL | 3 refills | Status: DC

## 2023-01-03 NOTE — Patient Instructions (Signed)
Decrease metformin 500 mg XR, 1 tablet daily

## 2023-01-03 NOTE — Telephone Encounter (Signed)
Provider that did labs did not do a Hepatic function panel should patient still have blood draw?

## 2023-01-03 NOTE — Progress Notes (Signed)
Name: Beverly Atkinson  MRN/ DOB: [id number], [birth year]    Age/ Sex: 42 y.o., female    PCP: Shade Flood, MD   Reason for Endocrinology Evaluation: Hypothyroidism     Date of Initial Endocrinology Evaluation: 01/01/2022    HPI: Beverly Atkinson is a 42 y.o. female with a past medical history of Hypothyroidism . The patient presented for initial endocrinology clinic visit on 01/01/2022  for consultative assistance with her Hypothyroidism   She was diagnosed with hypothyroidism  at ~ age 53. She was on Synthroid but due to insurance coverage , this was switched to levothyroxine but her TSH was difficult to control   She was started on NP thyroid through [place] ~ a couple of  years    Patient has been noted with low TSH since 2019 with a nadir of 0.00 uIU/mL in January 2023    Denies XRT  She is on Thyroid  Complex that she takes every other day and it makes her feel better    Mother and paternal grandmother with thyroid disease    Of note the patient is on NP thyroid 90 mg daily    Of note, the pt was diagnosed with PCOS in the past. She has tried multiple COC's without much help . She has been focusing on diet and life style changes .  She follows with GYN.  She gets menstruations monthly (more every 3 weeks) She has also been diagnosed with pre-diabetes , last A1c 6.0 %     DIABETIC HISTORY:  Deisha Mosby was diagnosed with DM  06/2022. Her  hemoglobin A1c was 6.8% at time of diagnosis.  I have attempted to prescribe Ozempic but this was declined by her insurance, and she was started on metformin instead    HYPOKALEMIA : 24-hour urinary cortisol was normal 06/2022 including normal renin and low aldosterone.  SUBJECTIVE:   During the last visit (07/03/2022): A1c 6.8%   Today (01/03/23):  Beverly Atkinson is here for a follow up on Hypothyroidism, and diabetes.  She checks her glucose 1 times daily, no hypoglycemia  Has loose stools but no nausea or vomiting   Denies palpitations  Denies tremors  She is on norethindrone through Gyn with endometriosis pain    NP thyroid 90 mg daily Metformin 500 XR, twice daily    HISTORY:  Past Medical History:  Past Medical History:  Diagnosis Date   Arthritis    Depression    Heart murmur    Insomnia    Restless leg    Thyroid disease    Past Surgical History:  Past Surgical History:  Procedure Laterality Date   CHOLECYSTECTOMY     2015   EYE SURGERY     Lazy eye at age 52   LAPAROSCOPIC ENDOMETRIOSIS FULGURATION  08/23/2015    Social History:  reports that she has never smoked. She has never used smokeless tobacco. She reports current alcohol use. She reports that she does not use drugs. Family History: family history includes Alcohol abuse in her father and mother; Arthritis in her father and paternal grandmother; Cancer in her father; Drug abuse in her father, mother, and sister; Heart disease in her father; Hyperlipidemia in her father; Hypertension in her father; Mental illness in her mother; Thyroid disease in her mother.   HOME MEDICATIONS: Allergies as of 01/03/2023       Reactions   Ace Inhibitors Cough   Clarithromycin Rash        Medication List  Accurate as of January 03, 2023 11:09 AM. If you have any questions, ask your nurse or doctor.          atorvastatin 10 MG tablet Commonly known as: LIPITOR Take 1 tablet (10 mg total) by mouth daily.   Calcium Carbonate-Vitamin D 300-100 MG-UNIT Caps Take by mouth.   metFORMIN 500 MG 24 hr tablet Commonly known as: GLUCOPHAGE-XR Take 1 tablet (500 mg total) by mouth 2 (two) times daily with a meal.   MULTIVITAMIN PO Take by mouth.   NON FORMULARY   norethindrone 5 MG tablet Commonly known as: AYGESTIN Take 5 mg by mouth daily.   ondansetron 4 MG tablet Commonly known as: Zofran Take 1 tablet (4 mg total) by mouth every 8 (eight) hours as needed for nausea or vomiting.   OneTouch Verio test  strip Generic drug: glucose blood 1 each by Other route daily in the afternoon. Use as instructed   OneTouch Verio w/Device Kit 1 Device by Does not apply route daily in the afternoon.   OVER THE COUNTER MEDICATION Take 1 capsule by mouth daily as needed. [employer] Thyroid complex.   sertraline 100 MG tablet Commonly known as: ZOLOFT Take 1 tablet (100 mg total) by mouth daily.   temazepam 15 MG capsule Commonly known as: RESTORIL TAKE 1 CAPSULE BY MOUTH AT BEDTIME AS NEEDED FOR SLEEP   thyroid 90 MG tablet Commonly known as: NP Thyroid Take 1 tablet (90 mg total) by mouth daily.   traMADol 50 MG tablet Commonly known as: ULTRAM Take 1 tablet (50 mg total) by mouth every 6 (six) hours as needed.   Turmeric Curcumin 500 MG Caps Take 1 capsule by mouth daily.   valsartan 80 MG tablet Commonly known as: DIOVAN Take 1 tablet by mouth once daily   VITAMIN B COMPLEX PO Take by mouth.          REVIEW OF SYSTEMS: A comprehensive ROS was conducted with the patient and is negative except as per HPI     OBJECTIVE:  VS: BP 130/82 (BP Location: Left Arm, Patient Position: Sitting)   Pulse 74   Ht 5' (1.524 m)   Wt 181 lb (82.1 kg)   SpO2 97%   BMI 35.35 kg/m    Wt Readings from Last 3 Encounters:  01/03/23 181 lb (82.1 kg)  12/10/22 186 lb 3.2 oz (84.5 kg)  10/17/22 189 lb 3.2 oz (85.8 kg)     EXAM: General: Pt appears well and is in NAD  Neck: General: Supple without adenopathy. Thyroid: Thyroid size normal.  No goiter or nodules appreciated.  Lungs: Clear with good BS bilat   Heart: Auscultation: RRR.  Abdomen: soft, nontender  Extremities:  BL LE: No pretibial edema   Mental Status: Judgment, insight: Intact Orientation: Oriented to time, place, and person Mood and affect: No depression, anxiety, or agitation   DM Foot Exam 01/03/2023  The skin of the feet is intact without sores or ulcerations. The pedal pulses are 2+ on right and 2+ on  left. The sensation is intact to a screening 5.07, 10 gram monofilament bilaterally    DATA REVIEWED:  Latest Reference Range & Units 01/03/23 11:36  Sodium 135 - 146 mmol/L 139  Potassium 3.5 - 5.3 mmol/L 3.9  Chloride 98 - 110 mmol/L 102  CO2 20 - 32 mmol/L 27  Glucose 65 - 99 mg/dL 433 (H)  BUN 7 - 25 mg/dL 11  Creatinine 2.95 - 1.88 mg/dL 4.16  Calcium 8.6 - 10.2 mg/dL 9.8  BUN/Creatinine Ratio 6 - 22 (calc) SEE NOTE:  AG Ratio 1.0 - 2.5 (calc) 1.5  AST 10 - 30 U/L 20  ALT 6 - 29 U/L 24  Total Protein 6.1 - 8.1 g/dL 7.3  Total Bilirubin 0.2 - 1.2 mg/dL 0.5  Total CHOL/HDL Ratio <5.0 (calc) 5.0 (H)  Cholesterol <200 mg/dL 409  HDL Cholesterol > OR = 50 mg/dL 40 (L)  LDL Cholesterol (Calc) mg/dL (calc) 811 (H)  MICROALB/CREAT RATIO <30 mg/g creat 52 (H)  Non-HDL Cholesterol (Calc) <130 mg/dL (calc) 914 (H)  Triglycerides <150 mg/dL 782 (H)    Latest Reference Range & Units 01/03/23 11:36  TSH mIU/L 1.18  Albumin MSPROF 3.6 - 5.1 g/dL 4.4    Latest Reference Range & Units 01/03/23 11:36  Microalb, Ur mg/dL 6.5  MICROALB/CREAT RATIO <30 mg/g creat 52 (H)  Creatinine, Urine 20 - 275 mg/dL 956  (H): Data is abnormally high     Latest Reference Range & Units 10/17/22 11:13  Sodium 135 - 145 mEq/L 139  Potassium 3.5 - 5.1 mEq/L 3.5  Chloride 96 - 112 mEq/L 108  CO2 19 - 32 mEq/L 21  Glucose 70 - 99 mg/dL 80  BUN 6 - 23 mg/dL 14  Creatinine 2.13 - 0.86 mg/dL 5.78  Calcium 8.4 - 46.9 mg/dL 9.1  Alkaline Phosphatase 39 - 117 U/L 46  Albumin 3.5 - 5.2 g/dL 4.0  AST 0 - 37 U/L 19  ALT 0 - 35 U/L 16  Total Protein 6.0 - 8.3 g/dL 7.0  Total Bilirubin 0.2 - 1.2 mg/dL 0.6  GFR >62.95 mL/min 79.15  Total CHOL/HDL Ratio  8  Cholesterol 0 - 200 mg/dL 284 (H)  HDL Cholesterol >39.00 mg/dL 13.24 (L)  LDL (calc) 0 - 99 mg/dL 401 (H)  NonHDL  027.25  Triglycerides 0.0 - 149.0 mg/dL 366.4 (H)  VLDL 0.0 - 40.3 mg/dL 47.4      Latest Reference Range & Units Most Recent   Thyroperoxidase Ab SerPl-aCnc 0 - 34 IU/mL 120 (H) 03/18/17 10:16     ASSESSMENT/PLAN/RECOMMENDATIONS:   1) Type 2 Diabetes Mellitus, Optimally controlled, With microalbuminuria complications - Most recent A1c of 5.6 %. Goal A1c < 7.0 %.    -I have praised the patient on optimizing glucose control and weight loss -Patient was encouraged to continue with lifestyle changes -I have recommended decreasing metformin as below  MEDICATIONS: Decrease metformin 500 mg XR, 1 tablet daily  EDUCATION / INSTRUCTIONS: BG monitoring instructions: Patient is instructed to check her  blood sugars 2-3 times a week. Call [facility] if: BG persistently < 70  I reviewed the Rule of 15 for the treatment of hypoglycemia in detail with the patient. Literature supplied.    2) Diabetic complications:  Eye: Does not have known diabetic retinopathy.  Neuro/ Feet: Does not have known diabetic peripheral neuropathy .  Renal: Patient does not have known baseline CKD. She  is  on an ACEI/ARB at present.     3) Hashimoto's thyroiditis :  -Patient is clinically euthyroid -TSH normal  Medications : NP thyroid 90 mg daily   4) Dyslipidemia :   -LDL above goal -Will increase atorvastatin as below  Medication Increase atorvastatin 20 mg daily  5) Microalbuminuria:  -This is a slight elevation -She is already on Diovan -Will continue to monitor    F/U in 6 months    Signed electronically by: Lyndle Herrlich, MD  [facility] Endocrinology  [facility] [address] [place], [place] [address] Phone: [phone] FAX: [phone]   CC: Shade Flood, MD [address] [place] [address] Phone: [phone] Fax: [phone]   Return to Endocrinology clinic as below: Future Appointments  Date Time Provider Department Center  01/09/2023 10:00 AM [facility]  05/08/2023 10:00 AM Shade Flood, MD [facility]

## 2023-01-04 DIAGNOSIS — R809 Proteinuria, unspecified: Secondary | ICD-10-CM | POA: Insufficient documentation

## 2023-01-04 LAB — COMPREHENSIVE METABOLIC PANEL
AG Ratio: 1.5 (calc) (ref 1.0–2.5)
ALT: 24 U/L (ref 6–29)
AST: 20 U/L (ref 10–30)
Albumin: 4.4 g/dL (ref 3.6–5.1)
Alkaline phosphatase (APISO): 70 U/L (ref 31–125)
BUN: 11 mg/dL (ref 7–25)
CO2: 27 mmol/L (ref 20–32)
Calcium: 9.8 mg/dL (ref 8.6–10.2)
Chloride: 102 mmol/L (ref 98–110)
Creat: 0.9 mg/dL (ref 0.50–0.99)
Globulin: 2.9 g/dL (ref 1.9–3.7)
Glucose, Bld: 106 mg/dL — ABNORMAL HIGH (ref 65–99)
Potassium: 3.9 mmol/L (ref 3.5–5.3)
Sodium: 139 mmol/L (ref 135–146)
Total Bilirubin: 0.5 mg/dL (ref 0.2–1.2)
Total Protein: 7.3 g/dL (ref 6.1–8.1)

## 2023-01-04 LAB — LIPID PANEL
Cholesterol: 198 mg/dL (ref <200)
HDL: 40 mg/dL — ABNORMAL LOW (ref >=50)
LDL Cholesterol (Calc): 126 mg/dL — ABNORMAL HIGH
Non-HDL Cholesterol (Calc): 158 mg/dL — ABNORMAL HIGH (ref <130)
Total CHOL/HDL Ratio: 5 (calc) — ABNORMAL HIGH (ref <5.0)
Triglycerides: 197 mg/dL — ABNORMAL HIGH (ref <150)

## 2023-01-04 LAB — MICROALBUMIN / CREATININE URINE RATIO
Creatinine, Urine: 125 mg/dL (ref 20–275)
Microalb Creat Ratio: 52 mg/g{creat} — ABNORMAL HIGH (ref <30)
Microalb, Ur: 6.5 mg/dL

## 2023-01-04 LAB — TSH: TSH: 1.18 m[IU]/L

## 2023-01-04 MED ORDER — THYROID 90 MG PO TABS: 90.0000 mg | ORAL_TABLET | Freq: Every day | ORAL | 3 refills | Status: DC

## 2023-01-04 MED ORDER — ATORVASTATIN CALCIUM 20 MG PO TABS: 20.0000 mg | ORAL_TABLET | Freq: Every day | ORAL | 3 refills | Status: DC

## 2023-01-09 ENCOUNTER — Other Ambulatory Visit: Payer: BC Managed Care – PPO

## 2023-01-12 ENCOUNTER — Other Ambulatory Visit: Payer: Self-pay | Admitting: Family Medicine

## 2023-01-12 DIAGNOSIS — G4700 Insomnia, unspecified: Secondary | ICD-10-CM

## 2023-01-18 ENCOUNTER — Other Ambulatory Visit: Payer: Self-pay | Admitting: Family Medicine

## 2023-01-18 DIAGNOSIS — G4700 Insomnia, unspecified: Secondary | ICD-10-CM

## 2023-01-21 ENCOUNTER — Other Ambulatory Visit: Payer: Self-pay | Admitting: Family Medicine

## 2023-01-21 DIAGNOSIS — G4700 Insomnia, unspecified: Secondary | ICD-10-CM

## 2023-01-24 ENCOUNTER — Other Ambulatory Visit: Payer: Self-pay

## 2023-01-24 DIAGNOSIS — G4700 Insomnia, unspecified: Secondary | ICD-10-CM

## 2023-01-24 MED ORDER — TEMAZEPAM 15 MG PO CAPS: ORAL_CAPSULE | ORAL | 0 refills | Status: DC

## 2023-01-24 NOTE — Telephone Encounter (Signed)
 Temazepam discussed in November for chronic insomnia.  Controlled substance database reviewed.  Last filled #30 on 12/11/2022, previously 11/14/2022.  No concerns.  Refill ordered.

## 2023-01-24 NOTE — Telephone Encounter (Signed)
 Patient spoke with the pharmacy and they stated they need another prescription sent over for patient medication

## 2023-01-24 NOTE — Telephone Encounter (Signed)
 Copied from CRM [id number]. Topic: Clinical - Prescription Issue >> Jan 24, 2023  1:17 PM Fredrica W wrote: Reason for CRM: Pt called in about request for refill. Per chart refill looks like ordered 12/23. Pt contacted pharmacy - [facility] who states they have not received order and have faxed two requests but they have been denied. Pt wants to know how to proceed and have Rx filled. She has been without medication for two weeks. Thank You

## 2023-01-24 NOTE — Telephone Encounter (Signed)
 Requested Prescriptions   Pending Prescriptions Disp Refills   temazepam  (RESTORIL ) 15 MG capsule 30 capsule 0    Sig: TAKE 1 CAPSULE BY MOUTH AT BEDTIME AS NEEDED FOR SLEEP     Date of patient request: 01/24/2023 Last office visit: Visit date not found Upcoming visit: Visit date not found Date of last refill: 01/14/2023 (printed by mistake) Last refill amount: 30

## 2023-01-30 ENCOUNTER — Other Ambulatory Visit (HOSPITAL_BASED_OUTPATIENT_CLINIC_OR_DEPARTMENT_OTHER): Payer: Self-pay

## 2023-01-30 DIAGNOSIS — I10 Essential (primary) hypertension: Secondary | ICD-10-CM

## 2023-01-30 MED ORDER — VALSARTAN 80 MG PO TABS: 80.0000 mg | ORAL_TABLET | Freq: Every day | ORAL | 0 refills | Status: DC

## 2023-01-31 ENCOUNTER — Encounter (HOSPITAL_BASED_OUTPATIENT_CLINIC_OR_DEPARTMENT_OTHER): Payer: Self-pay | Admitting: Cardiology

## 2023-01-31 ENCOUNTER — Ambulatory Visit (HOSPITAL_BASED_OUTPATIENT_CLINIC_OR_DEPARTMENT_OTHER): Payer: BC Managed Care – PPO | Admitting: Cardiology

## 2023-01-31 VITALS — BP 140/82 | HR 88 | Ht 60.0 in | Wt 184.1 lb

## 2023-01-31 DIAGNOSIS — Z6835 Body mass index (BMI) 35.0-35.9, adult: Secondary | ICD-10-CM

## 2023-01-31 DIAGNOSIS — E8881 Metabolic syndrome: Secondary | ICD-10-CM

## 2023-01-31 DIAGNOSIS — E66812 Obesity, class 2: Secondary | ICD-10-CM | POA: Diagnosis not present

## 2023-01-31 DIAGNOSIS — I447 Left bundle-branch block, unspecified: Secondary | ICD-10-CM

## 2023-01-31 DIAGNOSIS — E782 Mixed hyperlipidemia: Secondary | ICD-10-CM

## 2023-01-31 DIAGNOSIS — E6609 Other obesity due to excess calories: Secondary | ICD-10-CM

## 2023-01-31 DIAGNOSIS — I10 Essential (primary) hypertension: Secondary | ICD-10-CM

## 2023-01-31 DIAGNOSIS — E119 Type 2 diabetes mellitus without complications: Secondary | ICD-10-CM

## 2023-01-31 MED ORDER — VALSARTAN 80 MG PO TABS: 80.0000 mg | ORAL_TABLET | Freq: Every day | ORAL | 3 refills | Status: DC

## 2023-01-31 MED ORDER — ROSUVASTATIN CALCIUM 5 MG PO TABS: 5.0000 mg | ORAL_TABLET | Freq: Every day | ORAL | 11 refills | Status: DC

## 2023-01-31 NOTE — Progress Notes (Signed)
 Cardiology Office Note:  .   Date:  01/31/2023  ID:  Beverly Atkinson, DOB [birth year], MRN [id number] PCP: Levora Reyes SAUNDERS, MD  [facility] Providers Cardiologist:  Shelda Bruckner, MD {  History of Present Illness: .   Beverly Atkinson is a 43 y.o. female with a hx of type II diabetes, hypertension, HLD, hypothyroidism, and PCOS who is seen for follow up. She was initially seen by me for consult on 05/23/21 at the request of Levora Reyes SAUNDERS, MD for the evaluation and management of LBBB and heart murmur.   Pertinent CV history: history of LBBB, prior cardiologist in [place], [place]. Reported normal stress test and echo.   Metabolic syndrome: PCOS  Abdominal adiposity BMI 36 Hypertension Mixed hyperlipidemia/dyslipidemia Type II diabetes  Today: Diagnosed with diabetes. Started on metformin . Has lost 10 lbs, cut out soda. Started on atorvastatin  12/2022, even tried only once a week and still had muscle aches. Lipids on atorvastatin  did improve, even without a full 6 weeks of therapies. Tchol 198, HDL 40, TG 197, LDL 126 (from 176).   BP elevated but out of meds, reordered today.  ROS: Denies chest pain, shortness of breath at rest or with normal exertion. No PND, orthopnea, LE edema or unexpected weight gain. No syncope or palpitations. ROS otherwise negative except as noted.   Studies Reviewed: SABRA    EKG:  EKG Interpretation Date/Time:  Thursday January 31 2023 14:16:38 EST Ventricular Rate:  81 PR Interval:  150 QRS Duration:  150 QT Interval:  428 QTC Calculation: 497 R Axis:   51  Text Interpretation: Normal sinus rhythm Left bundle branch block Confirmed by Bruckner Shelda [phone]) on 01/31/2023 2:38:59 PM    Physical Exam:   VS:  BP (!) 140/82   Pulse 88   Ht 5' (1.524 m)   Wt 184 lb 1.6 oz (83.5 kg)   SpO2 98%   BMI 35.95 kg/m    Wt Readings from Last 3 Encounters:  01/31/23 184 lb 1.6 oz (83.5 kg)  01/03/23 181 lb (82.1 kg)  12/10/22 186 lb 3.2 oz  (84.5 kg)    GEN: Well nourished, well developed in no acute distress HEENT: Normal, moist mucous membranes NECK: No JVD CARDIAC: regular rhythm, normal S1 and S2, no rubs or gallops. 1/6 systolic murmur. VASCULAR: Radial and DP pulses 2+ bilaterally. No carotid bruits RESPIRATORY:  Clear to auscultation without rales, wheezing or rhonchi  ABDOMEN: Soft, non-tender, non-distended MUSCULOSKELETAL:  Ambulates independently SKIN: Warm and dry, no edema NEUROLOGIC:  Alert and oriented x 3. No focal neuro deficits noted. PSYCHIATRIC:  Normal affect    ASSESSMENT AND PLAN: .    Hypertension -dry cough on enalapril , doing well with valsartan  -out of her ARB, reordered today   Left bundle branch block: chronic, prior echo/MPI normal per patient Murmur: very soft, no high risk features   Metabolic syndrome: Type II diabetes PCOS Abdominal adiposity BMI ~36, obesity class 2 Hypertension Mixed hyperlipidemia/dyslipidemia -now on metformin  -did not tolerate atorvastatin  due to myalgia. Discussed options, will trial rosuvastatin . Recommend checking labs once she is on a steady dose for 2 mos. She will let me know if she wants me to order or if she will get through Dr. Levora -congratulated on lifestyle change and weight loss of 10 lbs  CV risk counseling and prevention -recommend heart healthy/Mediterranean diet, with whole grains, fruits, vegetable, fish, lean meats, nuts, and olive oil. Limit salt. -recommend moderate walking, 3-5 times/week for 30-50 minutes each session.  Aim for at least 150 minutes.week. Goal should be pace of 3 miles/hours, or walking 1.5 miles in 30 minutes -recommend avoidance of tobacco products. Avoid excess alcohol. -ASCVD risk score: The 10-year ASCVD risk score (Arnett DK, et al., 2019) is: 3.9%   Values used to calculate the score:     Age: 22 years     Sex: Female     Is Non-Hispanic African American: No     Diabetic: Yes     Tobacco smoker: No      Systolic Blood Pressure: 140 mmHg     Is BP treated: Yes     HDL Cholesterol: 40 mg/dL     Total Cholesterol: 198 mg/dL    Dispo: 6 mos or sooner as needed  Signed, Shelda Bruckner, MD   Shelda Bruckner, MD, PhD, [facility]  [facility]  [place]  Heart & Vascular at [facility] at [facility] [address] [place], [place] [address] [phone]

## 2023-01-31 NOTE — Patient Instructions (Signed)
 Medication Instructions:  Your physician has recommended you make the following change in your medication:  STOP Atorvastatin  START Rosuvastatin  5 mg three times a week then increase to daily if no symptoms occur   *If you need a refill on your cardiac medications before your next appointment, please call your pharmacy*   Follow-Up: At [facility], you and your health needs are our priority.  As part of our continuing mission to provide you with exceptional heart care, we have created designated Provider Care Teams.  These Care Teams include your primary Cardiologist (physician) and Advanced Practice Providers (APPs -  Physician Assistants and Nurse Practitioners) who all work together to provide you with the care you need, when you need it.  We recommend signing up for the patient portal called MyChart.  Sign up information is provided on this After Visit Summary.  MyChart is used to connect with patients for Virtual Visits (Telemedicine).  Patients are able to view lab/test results, encounter notes, upcoming appointments, etc.  Non-urgent messages can be sent to your provider as well.   To learn more about what you can do with MyChart, go to [email or web address].    Your next appointment:   6 month(s)  Provider:   Shelda Bruckner, MD    Other Instructions    Try rosuvastatin  5 mg--start with taking it 3 times a week, and then if that goes well, increase to daily. I'd recheck cholesterol once you are on a steady dose for about 2 mos. I'm happy to order this or Dr. Levora can--just let me know!

## 2023-02-19 ENCOUNTER — Other Ambulatory Visit: Payer: Self-pay | Admitting: Family Medicine

## 2023-02-19 DIAGNOSIS — G4700 Insomnia, unspecified: Secondary | ICD-10-CM

## 2023-02-19 NOTE — Telephone Encounter (Signed)
Medication discussed in September and November 2024.  Controlled substance database reviewed.  Temazepam 15 mg last filled for #30 on 01/24/2023.  Refill ordered to be available for pickup when due.

## 2023-02-19 NOTE — Telephone Encounter (Signed)
Requested Prescriptions   Pending Prescriptions Disp Refills   temazepam (RESTORIL) 15 MG capsule [Pharmacy Med Name: Temazepam 15 MG Oral Capsule] 30 capsule 0    Sig: TAKE 1 CAPSULE BY MOUTH AT BEDTIME AS NEEDED FOR SLEEP     Date of patient request: 02/19/2023 Last office visit: 12/10/2022 Upcoming visit: 05/08/2023 Date of last refill: 01/24/2023 Last refill amount: 30

## 2023-02-20 ENCOUNTER — Encounter: Payer: Self-pay | Admitting: Family Medicine

## 2023-03-24 ENCOUNTER — Other Ambulatory Visit: Payer: Self-pay | Admitting: Family Medicine

## 2023-03-24 DIAGNOSIS — G4700 Insomnia, unspecified: Secondary | ICD-10-CM

## 2023-03-25 NOTE — Telephone Encounter (Signed)
 Controlled substance database reviewed.  #30 last filled on 02/20/2023, previously 01/24/2023, 12/11/2022.  No concerns.  Medication discussed at her December 10, 2022 visit, refill ordered.

## 2023-04-23 ENCOUNTER — Other Ambulatory Visit: Payer: Self-pay | Admitting: Family Medicine

## 2023-04-23 DIAGNOSIS — G4700 Insomnia, unspecified: Secondary | ICD-10-CM

## 2023-04-25 ENCOUNTER — Telehealth: Payer: Self-pay

## 2023-04-25 DIAGNOSIS — G4700 Insomnia, unspecified: Secondary | ICD-10-CM

## 2023-04-25 MED ORDER — TEMAZEPAM 15 MG PO CAPS: ORAL_CAPSULE | ORAL | 0 refills | Status: DC

## 2023-04-25 NOTE — Telephone Encounter (Signed)
 Copied from CRM [id number]. Topic: Clinical - Prescription Issue >> Apr 25, 2023 11:32 AM Kathryne Eriksson wrote: Reason for CRM: temazepam (RESTORIL) 15 MG capsule

## 2023-04-26 ENCOUNTER — Other Ambulatory Visit: Payer: Self-pay | Admitting: Family Medicine

## 2023-04-26 DIAGNOSIS — G4700 Insomnia, unspecified: Secondary | ICD-10-CM

## 2023-04-26 MED ORDER — TEMAZEPAM 15 MG PO CAPS: ORAL_CAPSULE | ORAL | 0 refills | Status: DC

## 2023-04-26 NOTE — Telephone Encounter (Signed)
 Copied from CRM [id number]. Topic: Clinical - Prescription Issue >> Apr 25, 2023 11:32 AM Kathryne Eriksson wrote: Reason for CRM: temazepam (RESTORIL) 15 MG capsule >> Apr 26, 2023  1:00 PM Gurney Maxin H wrote: Patient is following up on refill request for her temazepam (RESTORIL) 15 MG capsule but medication shows discontinued by provider, please reach out to patient, thanks.  [place] [phone]  >> Apr 26, 2023  9:10 AM [place] A wrote: Patient called to check on medication refill Agent informed her that it is being worked on  >> Apr 25, 2023 11:33 AM Kathryne Eriksson wrote: Patient called in stating she received a message in regards to the medication, stating the medication couldn't be authorized through e-file. Patient states it happened before in the past and it was a simple glitch in the system, therefor patient is asking for it to be resent to pharmacy.

## 2023-04-26 NOTE — Telephone Encounter (Signed)
 [facility] called, they are closed for lunch. Will try again after 1400 to check on status of refill sent on 04/23/23.

## 2023-04-26 NOTE — Telephone Encounter (Signed)
 Office visit 12/10/2022.  Restoril discussed at that time with stable insomnia.  Controlled substance database reviewed.  #30 temazepam last filled on 03/25/2023, previously 02/20/2023, refill ordered.  Appears it was set to print previously, I changed to normal routing.

## 2023-04-26 NOTE — Telephone Encounter (Signed)
 [facility] called and spoke to Lauren, Technician about the refill(s) temazepam requested. Advised it was sent on 04/25/23 #30/0 refill(s). She says it was not received and asked if it can be sent back over. Advised I will send to the provider.

## 2023-04-26 NOTE — Telephone Encounter (Signed)
 Please re send to pharmacy as they state they have not received it

## 2023-05-08 ENCOUNTER — Encounter (INDEPENDENT_AMBULATORY_CARE_PROVIDER_SITE_OTHER): Payer: Self-pay | Admitting: *Deleted

## 2023-05-08 ENCOUNTER — Ambulatory Visit: Payer: BC Managed Care – PPO | Admitting: Family Medicine

## 2023-05-08 VITALS — BP 112/68 | HR 74 | Temp 98.0°F | Ht 60.0 in | Wt 179.8 lb

## 2023-05-08 DIAGNOSIS — I10 Essential (primary) hypertension: Secondary | ICD-10-CM | POA: Diagnosis not present

## 2023-05-08 DIAGNOSIS — R0989 Other specified symptoms and signs involving the circulatory and respiratory systems: Secondary | ICD-10-CM | POA: Diagnosis not present

## 2023-05-08 DIAGNOSIS — G8929 Other chronic pain: Secondary | ICD-10-CM

## 2023-05-08 DIAGNOSIS — E1169 Type 2 diabetes mellitus with other specified complication: Secondary | ICD-10-CM | POA: Diagnosis not present

## 2023-05-08 DIAGNOSIS — M545 Low back pain, unspecified: Secondary | ICD-10-CM | POA: Diagnosis not present

## 2023-05-08 DIAGNOSIS — G4700 Insomnia, unspecified: Secondary | ICD-10-CM

## 2023-05-08 DIAGNOSIS — E785 Hyperlipidemia, unspecified: Secondary | ICD-10-CM

## 2023-05-08 DIAGNOSIS — Z7984 Long term (current) use of oral hypoglycemic drugs: Secondary | ICD-10-CM

## 2023-05-08 LAB — LIPID PANEL
Cholesterol: 183 mg/dL (ref 0–200)
HDL: 44.7 mg/dL (ref >39.00)
LDL Cholesterol: 105 mg/dL — ABNORMAL HIGH (ref 0–99)
NonHDL: 138.39
Total CHOL/HDL Ratio: 4
Triglycerides: 166 mg/dL — ABNORMAL HIGH (ref 0.0–149.0)
VLDL: 33.2 mg/dL (ref 0.0–40.0)

## 2023-05-08 LAB — COMPREHENSIVE METABOLIC PANEL WITH GFR
ALT: 18 U/L (ref 0–35)
AST: 20 U/L (ref 0–37)
Albumin: 4.5 g/dL (ref 3.5–5.2)
Alkaline Phosphatase: 60 U/L (ref 39–117)
BUN: 13 mg/dL (ref 6–23)
CO2: 26 meq/L (ref 19–32)
Calcium: 9.4 mg/dL (ref 8.4–10.5)
Chloride: 104 meq/L (ref 96–112)
Creatinine, Ser: 0.86 mg/dL (ref 0.40–1.20)
GFR: 82.15 mL/min (ref >60.00)
Glucose, Bld: 99 mg/dL (ref 70–99)
Potassium: 4 meq/L (ref 3.5–5.1)
Sodium: 138 meq/L (ref 135–145)
Total Bilirubin: 0.6 mg/dL (ref 0.2–1.2)
Total Protein: 7.3 g/dL (ref 6.0–8.3)

## 2023-05-08 MED ORDER — TRAMADOL HCL 50 MG PO TABS
50.0000 mg | ORAL_TABLET | Freq: Four times a day (QID) | ORAL | 0 refills | Status: DC | PRN
Start: 2023-05-08 — End: 2023-11-14

## 2023-05-08 NOTE — Progress Notes (Signed)
 Subjective:  Patient ID: Beverly Atkinson, female    DOB: February 02, 1980  Age: 43 y.o. MRN: [id number]  CC:  Chief Complaint  Patient presents with   Medical Management of Chronic Issues    Pt notes doing okay, has one question about swallowing, pt is fasting    Choking    Pt notes occasionally when swallowing will get a "stuck feeling" and will have to vomit to dislodge this feeling, notes was pretty rare but has become more frequent didn't know if she needed a referral     HPI Beverly Atkinson presents for follow-up Has seen cardiology, Dr. Veryl Gottron with hypertension, LBBB, metabolic syndrome, hyperlipidemia.  Appointment in January.  Followed by endocrinology with diabetes, Hashimoto's thyroiditis, Dr. Rosalea Collin, appointment in December. Down to every day dosing of metformin  with improved A1c.    Hyperlipidemia: Lipitor started last August after previous elevated readings and family history of heart disease, and father.  Lipitor was started in October initially every other day dosing.  No side effects were discussed in November.  Improving on December labs, LDL still elevated at 126.  Some difficulty tolerating Lipitor, switched to Crestor  by cardiology.  Initially every other day on her January 29 message.  Currently taking crestor  daily usually. Rare missed dose. No side effects with Crestor .  Lab Results  Component Value Date   CHOL 198 01/03/2023   HDL 40 (L) 01/03/2023   LDLCALC 126 (H) 01/03/2023   TRIG 197 (H) 01/03/2023   CHOLHDL 5.0 (H) 01/03/2023   Lab Results  Component Value Date   ALT 24 01/03/2023   AST 20 01/03/2023   ALKPHOS 46 10/17/2022   BILITOT 0.5 01/03/2023   Chronic low back pain Discussed in November.  Previously thought to be due to endometriosis, has seen chiropractor previously.  Previously referred for physical therapy.  Pain improved at her November visit.  Intermittent need for tramadol  previously for pain, endometriosis pain.  Tramadol  last filled for  #15 on 09/18/2022.  Improved after PT and HEP/stretches.  Few tramadol  left at home  - 1/2 pill and infrequent need.   Insomnia with depression. Chronic insomnia.  Multiple prior tensive treatment including trazodone, Elavil, Lunesta, Ambien, Seroquel.  Depression treated with sertraline  milligrams daily that has helped with anhedonia.  Ultimately Restoril  has been effective at 15 mg without parasomnias daytime sedation or new side effects.  Controlled substance database reviewed, #30 Restoril  last filled on 04/27/2023, previously 03/25/2023.  Restoril  nightly effective, no parasomnias. Still working well.     05/08/2023    9:54 AM 10/17/2022   11:12 AM 09/13/2022   10:35 AM 05/03/2022   11:28 AM 03/15/2022    2:17 PM  Depression screen PHQ 2/9  Decreased Interest 0 0 1 1 0  Down, Depressed, Hopeless 0 0 0 0 0  PHQ - 2 Score 0 0 1 1 0  Altered sleeping 0 0 1 1 1   Tired, decreased energy 0 1 2 2 1   Change in appetite 0 0 0 0 0  Feeling bad or failure about yourself  0 0 0 0 0  Trouble concentrating 0 0 0 0 0  Moving slowly or fidgety/restless 0 0 0 0 0  Suicidal thoughts 0 0 0 0 0  PHQ-9 Score 0 1 4 4 2   Difficult doing work/chores  Not difficult at all Somewhat difficult Not difficult at all    Hypertension: Valsartan  80 mg daily, stable today, evaluated by cardiology as above. Home readings: BP Readings from Last  3 Encounters:  05/08/23 112/68  01/31/23 (!) 140/82  01/03/23 130/82   Lab Results  Component Value Date   CREATININE 0.90 01/03/2023   Choking sensation Has occurred sporadically for years since 2016, every few months, thought was eating too fast. Occurring more frequently - 1-2 times per week. Past 5-6 months. With swallowing solid food. Breathes ok , but feels like gets stuck, has to regurgitate to clear, then eats ok. Worse with rice. Did have episode with scrambled egg recently. Notes with meats - especially dry meats.  No regular heartburn, occasionally notices - uses  prilosec intermittently.  No new cough/night sweats/fever/weight loss/abd pain.    History Patient Active Problem List   Diagnosis Date Noted   Type 2 diabetes mellitus with diabetic microalbuminuria, without long-term current use of insulin (HCC) 01/04/2023   Constipation 08/21/2019   Hyperlipidemia 07/15/2018   Pain management contract signed 09/27/2017   Opiate dependence (HCC) 09/27/2017   Polycystic ovaries 06/18/2017   History of endometriosis 06/18/2017   Hypothyroidism due to Hashimoto's thyroiditis 06/18/2017   Essential hypertension 06/13/2017   Hashimoto's thyroiditis 03/18/2017   Primary hypothyroidism 03/18/2017   LBBB (left bundle branch block) 03/18/2017   Chronic low back pain 03/18/2017   Morbid obesity (HCC) 03/18/2017   Abnormal uterine bleeding (AUB) 08/09/2015   Chronic pelvic pain in female 08/09/2015   Epigastric pain 01/26/2014   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 Authorizing Provider  B Complex Vitamins (VITAMIN B COMPLEX PO) Take by mouth.   Yes [provider]  Blood Glucose Monitoring Suppl (ONETOUCH VERIO) w/Device KIT 1 Device by Does not apply route daily in the afternoon. 07/04/22  Yes Shamleffer, Ibtehal Jaralla, MD  Calcium  Carbonate-Vitamin D 300-100 MG-UNIT CAPS Take by mouth.   Yes [provider]  glucose blood (ONETOUCH VERIO) test  strip 1 each by Other route daily in the afternoon. Use as instructed 07/04/22  Yes Shamleffer, Ibtehal Jaralla, MD  metFORMIN  (GLUCOPHAGE -XR) 500 MG 24 hr tablet Take 1 tablet (500 mg total) by mouth daily with breakfast. 01/03/23  Yes Shamleffer, Ibtehal Jaralla, MD  Multiple Vitamins-Minerals (MULTIVITAMIN PO) Take by mouth.   Yes [provider]  NON FORMULARY    Yes [provider]  norethindrone (AYGESTIN) 5 MG tablet Take 5 mg by mouth daily.   Yes [provider]  ondansetron  (ZOFRAN ) 4 MG tablet Take 1 tablet (4 mg total) by mouth every 8 (eight) hours as needed for nausea or vomiting. 09/13/22  Yes Benjiman Bras, MD  OVER THE COUNTER MEDICATION Take 1 capsule by mouth daily as needed. [employer] Thyroid  complex.   Yes [provider]  sertraline  (ZOLOFT ) 100 MG tablet Take 1 tablet (100 mg total) by mouth daily. 03/15/22  Yes Benjiman Bras, MD  temazepam  (RESTORIL ) 15 MG capsule TAKE 1 CAPSULE BY MOUTH AT BEDTIME AS NEEDED FOR SLEEP 04/26/23  Yes Benjiman Bras, MD  thyroid  (NP THYROID ) 90 MG tablet Take 1 tablet (90 mg total) by mouth daily. 01/04/23  Yes Shamleffer, Ibtehal Jaralla, MD  traMADol  (ULTRAM ) 50 MG tablet Take 1 tablet (50 mg total) by mouth every 6 (six) hours as needed. 09/18/22  Yes Benjiman Bras, MD  valsartan  (DIOVAN ) 80 MG tablet Take 1 tablet (80 mg total) by mouth daily. 01/31/23  Yes Sheryle Donning, MD  rosuvastatin  (CRESTOR ) 5 MG tablet Take 1 tablet (5 mg total) by mouth daily. 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   Social Drivers of Corporate investment banker Strain: Low Risk  (05/07/2023)   Overall Financial Resource Strain (CARDIA)    Difficulty of Paying Living Expenses: Not very hard  Food Insecurity: No Food Insecurity (05/07/2023)   Hunger Vital Sign    Worried About Running Out of Food in the Last Year: Never true    Ran Out of Food in the Last Year: Never true  Transportation Needs: No Transportation Needs (05/07/2023)   PRAPARE - Administrator, Civil Service (Medical): No    Lack of Transportation (Non-Medical): No  Physical Activity: Insufficiently Active (05/07/2023)   Exercise Vital Sign    Days of Exercise per Week: 1 day    Minutes of Exercise per Session: 20 min  Stress: Stress Concern Present (05/07/2023)   [employer] of Occupational Health - Occupational Stress Questionnaire    Feeling of Stress : To some extent  Social Connections: Moderately Isolated (05/07/2023)   Social Connection and Isolation Panel [NHANES]    Frequency of Communication with Friends and Family: More than three times a week    Frequency of Social Gatherings with Friends and Family: Three times a week    Attends Religious Services: More than 4 times per year    Active Member of Clubs or Organizations: No    Attends Engineer, structural: Not on file    Marital Status: Never married  Intimate Partner Violence: Not on file    Review of Systems  Constitutional:  Negative for fatigue and unexpected weight change.  Respiratory:  Negative for chest tightness and shortness of breath.   Cardiovascular:  Negative for chest pain, palpitations and leg swelling.  Gastrointestinal:  Negative for abdominal pain and blood in stool.  Neurological:  Negative for  dizziness, syncope, light-headedness and headaches.     Objective:   Vitals:   05/08/23 0956  BP: 112/68  Pulse: 74  Temp: 98 F (36.7 C)  TempSrc: Temporal  SpO2: 98%  Weight: 179 lb 12.8 oz (81.6 kg)  Height: 5' (1.524 m)  Physical Exam Vitals reviewed.  Constitutional:      Appearance: Normal appearance. She is well-developed.  HENT:     Head: Normocephalic and atraumatic.  Eyes:     Conjunctiva/sclera: Conjunctivae normal.     Pupils: Pupils are equal, round, and reactive to light.  Neck:     Vascular: No carotid bruit.  Cardiovascular:     Rate and Rhythm: Normal rate and regular rhythm.     Heart sounds: Normal heart sounds.  Pulmonary:     Effort: Pulmonary effort is normal. No respiratory distress.     Breath sounds: Normal breath sounds. No stridor. No wheezing.  Abdominal:     General: There is no distension.     Palpations: Abdomen is soft. There is no pulsatile mass.     Tenderness: There is no abdominal tenderness.  Musculoskeletal:     Right lower leg: No edema.     Left lower leg: No edema.  Skin:    General: Skin is warm and dry.  Neurological:     Mental Status: She is alert and oriented to person, place, and time.  Psychiatric:        Mood and Affect: Mood normal.        Behavior: Behavior normal.        Assessment & Plan:  Beverly Atkinson is a 43 y.o. female . Choking sensation - Plan: Ambulatory referral to Gastroenterology  - As above, reassuring exam, refer to gastroenterology, soft foods, chew food thoroughly with RTC/ER precautions given. Likely need for EGD.   Episodic low back pain - Plan: traMADol  (ULTRAM ) 50 MG tablet  - Stable with intermittent/rare need of tramadol .  Continue same.  Refilled  Insomnia, unspecified type  - Stable with restoril .  continue same.  Hyperlipidemia associated with type 2 diabetes mellitus (HCC) - Plan: Comprehensive metabolic panel with GFR, Lipid panel  Essential hypertension - Plan:  Comprehensive metabolic panel with GFR  Chronic bilateral low back pain, unspecified whether sciatica present   Meds ordered this encounter  Medications   traMADol  (ULTRAM ) 50 MG tablet    Sig: Take 1 tablet (50 mg total) by mouth every 6 (six) hours as needed.    Dispense:  15 tablet    Refill:  0   Patient Instructions  I will refer you to gastroenterology as I suspect they will recommend and EGD (endoscopy).  Take prilosec daily for now. Soft foods, careful with meats and make sure to chew food well.  Return to the clinic or go to the nearest emergency room if any of your symptoms worsen or new symptoms occur.  No other med changes at this time.  If any concerns on labs I will let you know.  Take care!      Signed,   Caro Christmas, MD [facility], [facility] 05/08/23 10:43 AM

## 2023-05-08 NOTE — Patient Instructions (Addendum)
 I will refer you to gastroenterology as I suspect they will recommend and EGD (endoscopy).  Take prilosec daily for now. Soft foods, careful with meats and make sure to chew food well.  Return to the clinic or go to the nearest emergency room if any of your symptoms worsen or new symptoms occur.  No other med changes at this time.  If any concerns on labs I will let you know.  Take care!

## 2023-05-09 ENCOUNTER — Encounter: Payer: Self-pay | Admitting: Family Medicine

## 2023-05-09 ENCOUNTER — Ambulatory Visit (INDEPENDENT_AMBULATORY_CARE_PROVIDER_SITE_OTHER): Admitting: Gastroenterology

## 2023-05-09 ENCOUNTER — Encounter (INDEPENDENT_AMBULATORY_CARE_PROVIDER_SITE_OTHER): Payer: Self-pay | Admitting: Gastroenterology

## 2023-05-09 VITALS — BP 129/84 | HR 80 | Temp 97.8°F | Ht 60.0 in | Wt 184.6 lb

## 2023-05-09 DIAGNOSIS — R1319 Other dysphagia: Secondary | ICD-10-CM | POA: Insufficient documentation

## 2023-05-09 DIAGNOSIS — R1310 Dysphagia, unspecified: Secondary | ICD-10-CM

## 2023-05-09 NOTE — Progress Notes (Signed)
 Referring Provider: Benjiman Bras, MD Primary Care Physician:  Benjiman Bras, MD Primary GI Physician: New (Dr. Alita Irwin)  Chief Complaint  Patient presents with   Dysphagia    Patient here today with worsening dysphagia. Patient reports she has had issus with this for years, but it has been getting progressively worse.   HPI:   Beverly Atkinson is a 43 y.o. female with past medical history of arthritis, depression, heart murmur, insomnia, RLS, thyroid disease   Patient presenting today as a new patient for: Dysphagia   States she started having dysphagia very intermittently in 2015, has progressively worsened over the years. She has more issues with rice, breads and sometimes scrambled eggs. No issues with swallowing liquids. She notes that food feels that it sits in mid to lower chest. She denise any heartburn or acid regurgitation. Has some "pressure" in epigastric/chest area at times as well. She has hiccups often when she has food get hung up. She notes she cannot get food to pass, will have to regurgitate back up and then can usually resume eating without issue. No nausea or vomiting. Denies new medications for anything. She takes omeprazole periodically and was told by PCP to start taking this everyday which she just started doing.  She is trying to lose weight, denies any appetite changes. No rectal bleeding or melena.   NSAID use: she's uses aleve when on her menstrual cycle, takes ibuprofen a few times per week, takes around 600-800mg  a few times per week.  Social hx: no etoh or tobacco use  Fam hx: father had esophageal cancer(reports history of etoh and tobacco abuse)  Last Colonoscopy:2015 normal  Last Endoscopy: never    [employer]   05/09/23 1310  Weight: 184 lb 9.6 oz (83.7 kg)     Past Medical History:  Diagnosis Date   Arthritis    Depression    Heart murmur    Insomnia    Restless leg    Thyroid disease     Past Surgical History:  Procedure  Laterality Date   CHOLECYSTECTOMY     2015   EYE SURGERY     Lazy eye at age 67   LAPAROSCOPIC ENDOMETRIOSIS FULGURATION  08/23/2015    Current Outpatient Medications  Medication Sig Dispense Refill   B Complex Vitamins (VITAMIN B COMPLEX PO) Take by mouth daily at 6 (six) AM.     Blood Glucose Monitoring Suppl (ONETOUCH VERIO) w/Device KIT 1 Device by Does not apply route daily in the afternoon. 1 kit 0   glucose blood (ONETOUCH VERIO) test strip 1 each by Other route daily in the afternoon. Use as instructed 100 each 12   metFORMIN (GLUCOPHAGE-XR) 500 MG 24 hr tablet Take 1 tablet (500 mg total) by mouth daily with breakfast. 90 tablet 3   Multiple Vitamins-Minerals (MULTIVITAMIN PO) Take by mouth daily at 6 (six) AM.     norethindrone (AYGESTIN) 5 MG tablet Take 5 mg by mouth daily.     ondansetron (ZOFRAN) 4 MG tablet Take 1 tablet (4 mg total) by mouth every 8 (eight) hours as needed for nausea or vomiting. 20 tablet 0   OVER THE COUNTER MEDICATION Take 1 capsule by mouth daily as needed. The [employer] shop[employer] Thyroid complex.     rosuvastatin (CRESTOR) 5 MG tablet Take 1 tablet (5 mg total) by mouth daily. 30 tablet 11   sertraline (ZOLOFT) 100 MG tablet Take 1 tablet (100 mg total) by mouth daily. 90 tablet 3  temazepam (RESTORIL) 15 MG capsule TAKE 1 CAPSULE BY MOUTH AT BEDTIME AS NEEDED FOR SLEEP 30 capsule 0   thyroid (NP THYROID) 90 MG tablet Take 1 tablet (90 mg total) by mouth daily. 90 tablet 3   traMADol (ULTRAM) 50 MG tablet Take 1 tablet (50 mg total) by mouth every 6 (six) hours as needed. 15 tablet 0   valsartan (DIOVAN) 80 MG tablet Take 1 tablet (80 mg total) by mouth daily. 90 tablet 3   Calcium Carbonate-Vitamin D 300-100 MG-UNIT CAPS Take by mouth. (Patient not taking: Reported on 05/09/2023)     NON FORMULARY      No current facility-administered medications for this visit.    Allergies as of 05/09/2023 - Review Complete 05/09/2023  Allergen Reaction Noted    Ace inhibitors Cough 11/24/2021   Clarithromycin Rash 09/24/2013    Social History   Socioeconomic History   Marital status: Single    Spouse name: Not on file   Number of children: Not on file   Years of education: Not on file   Highest education level: Associate degree: academic program  Occupational History   Not on file  Tobacco Use   Smoking status: Never   Smokeless tobacco: Never  Vaping Use   Vaping status: Never Used  Substance and Sexual Activity   Alcohol use: Yes    Comment: occ   Drug use: No   Sexual activity: Not Currently  Other Topics Concern   Not on file  Social History Narrative   Single,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   Social Drivers of Corporate investment banker Strain: Low Risk  (05/07/2023)   Overall Financial Resource Strain (CARDIA)    Difficulty of Paying Living Expenses: Not very hard  Food Insecurity: No Food Insecurity (05/07/2023)   Hunger Vital Sign    Worried About Running Out of Food in the Last Year: Never true    Ran Out of Food in the Last Year: Never true  Transportation Needs: No Transportation Needs (05/07/2023)   PRAPARE - Administrator, Civil Service (Medical): No    Lack of Transportation (Non-Medical): No  Physical Activity: Insufficiently Active (05/07/2023)   Exercise Vital Sign    Days of Exercise per Week: 1 day    Minutes of Exercise per Session: 20 min  Stress: Stress Concern Present (05/07/2023)   [employer] of Occupational Health - Occupational Stress Questionnaire    Feeling of Stress : To some extent  Social Connections: Moderately Isolated (05/07/2023)   Social Connection and Isolation Panel [NHANES]    Frequency of Communication with Friends and Family: More than three times a week    Frequency of Social Gatherings with Friends and Family: Three times a week    Attends Religious Services: More than 4 times per year    Active Member of Clubs or Organizations: No     Attends Engineer, structural: Not on file    Marital Status: Never married    Review of systems General: negative for malaise, night sweats, fever, chills, weight loss Neck: Negative for lumps, goiter, pain and significant neck swelling Resp: Negative for cough, wheezing, dyspnea at rest CV: Negative for chest pain, leg swelling, palpitations, orthopnea GI: denies melena, hematochezia, nausea, vomiting, diarrhea, constipation, odyonophagia, early satiety or unintentional weight loss. +dysphagia  MSK: Negative for joint pain or swelling, back pain, and muscle pain. Derm: Negative for itching or rash Psych: Denies depression, anxiety, memory loss, confusion.  No homicidal or suicidal ideation.  Heme: Negative for prolonged bleeding, bruising easily, and swollen nodes. Endocrine: Negative for cold or heat intolerance, polyuria, polydipsia and goiter. Neuro: negative for tremor, gait imbalance, syncope and seizures. The remainder of the review of systems is noncontributory.  Physical Exam: BP 129/84 (BP Location: Right Arm, Patient Position: Sitting, Cuff Size: Large)   Pulse 80   Temp 97.8 F (36.6 C) (Temporal)   Ht 5' (1.524 m)   Wt 184 lb 9.6 oz (83.7 kg)   BMI 36.05 kg/m  General:   Alert and oriented. No distress noted. Pleasant and cooperative.  Head:  Normocephalic and atraumatic. Eyes:  Conjuctiva clear without scleral icterus. Mouth:  Oral mucosa pink and moist. Good dentition. No lesions. Heart: Normal rate and rhythm, s1 and s2 heart sounds present.  Lungs: Clear lung sounds in all lobes. Respirations equal and unlabored. Abdomen:  +BS, soft, non-tender and non-distended. No rebound or guarding. No HSM or masses noted. Derm: No palmar erythema or jaundice Msk:  Symmetrical without gross deformities. Normal posture. Extremities:  Without edema. Neurologic:  Alert and  oriented x4 Psych:  Alert and cooperative. Normal mood and affect.  Invalid input(s): "6  MONTHS"   ASSESSMENT: Beverly Atkinson is a 43 y.o. female presenting today as a new patient for dysphagia  Dysphagia ongoing since 2015 with progression of symptoms especially over the past few months. Denies heartburn or acid regurgitation but endorses some pressure in chest/epigastric region at times, was taking PPI PRN but recently started daily dosing of OTC prilosec at recommendation of PCP. She has had no weight loss, nausea or vomiting, notably father had history of esophageal cancer. Recommend proceeding with EGD at this time to evaluate for esophageal ring, web, stricture, stenosis, EOE, esophagitis, malignancy. Indications, risks and benefits of procedure discussed in detail with patient. Patient verbalized understanding and is in agreement to proceed with EGD. Patient did inquire about motility issues with the esophagus which we discussed would require further evaluation after EGD if no findings to explain her symptoms were found on endoscopic evaluation, would consider BPE/esophageal manometry at that time.   She does report last TCS in 2015, she turns 43 this November, will discuss screening colonoscopy closer to time of turning 43.   PLAN:  -schedule EGD +/- dilation ASA II -continue omeprazole 20mg  daily -good reflux precautions/chewing precautions -colonoscopy at age 12, can discuss further and schedule closer to time of turning 43  All questions were answered, patient verbalized understanding and is in agreement with plan as outlined above.   Follow Up: 3 months   Nadiyah Zeis L. Alyviah Crandle, MSN, APRN, AGNP-C Adult-Gerontology Nurse Practitioner [facility]

## 2023-05-09 NOTE — Patient Instructions (Signed)
 Continue taking omeprazole 20mg  daily Be mindful of thicker, drier foods such as breads, rice, pasta and meats as these tend to cause more issues with swallowing Take small bites, chew thoroughly with sips of liquids between bites We will get you scheduled for upper endoscopy for further evaluation of your symptoms  Follow up 3 months  It was a pleasure to see you today. I want to create trusting relationships with patients and provide genuine, compassionate, and quality care. I truly value your feedback! please be on the lookout for a survey regarding your visit with me today. I appreciate your input about our visit and your time in completing this!    Lainee Lehrman L. Jaciel Diem, MSN, APRN, AGNP-C Adult-Gerontology Nurse Practitioner [facility] at [facility]

## 2023-05-09 NOTE — Addendum Note (Signed)
 Addended by: Tahjae Clausing on: 05/09/2023 02:14 PM   Modules accepted: Orders

## 2023-05-10 ENCOUNTER — Encounter: Payer: Self-pay | Admitting: Family Medicine

## 2023-05-14 NOTE — Telephone Encounter (Signed)
 Lab results have been discussed.   Verbalized understanding? Yes  Are there any questions? No   Patient has appointment in October to revisit Dr.Greene and follow up labs.

## 2023-05-14 NOTE — Telephone Encounter (Signed)
-----   Message from Benjiman Bras sent at 05/14/2023  9:36 AM EDT ----- Results sent by MyChart, but appears patient has not yet reviewed those results.  Please call and make sure they have either seen note or discuss result note.  Thanks.

## 2023-05-23 ENCOUNTER — Other Ambulatory Visit: Payer: Self-pay | Admitting: Family Medicine

## 2023-05-23 DIAGNOSIS — G4700 Insomnia, unspecified: Secondary | ICD-10-CM

## 2023-05-23 NOTE — Telephone Encounter (Signed)
 Copied from CRM [id number]. Topic: Clinical - Medication Refill >> May 23, 2023 10:48 AM Elita Guitar wrote: Most Recent Primary Care Visit:  Provider: Caro Christmas R  Department: [facility]  Visit Type: OFFICE VISIT  Date: 05/08/2023  Medication: temazepam  (RESTORIL ) 15 MG capsule  Has the patient contacted their pharmacy? Yes She couldn't get through to the pharmacy.  Is this the correct pharmacy for this prescription? Yes If no, delete pharmacy and type the correct one.  This is the patient's preferred pharmacy:  [facility] [address], [place] - [address] [place] [address] Phone: [phone] Fax: [phone]   Has the prescription been filled recently? Yes  Is the patient out of the medication? No  Has the patient been seen for an appointment in the last year OR does the patient have an upcoming appointment? Yes  Can we respond through MyChart? Yes  Agent: Please be advised that Rx refills may take up to 3 business days. We ask that you follow-up with your pharmacy.

## 2023-05-24 MED ORDER — TEMAZEPAM 15 MG PO CAPS: ORAL_CAPSULE | ORAL | 0 refills | Status: DC

## 2023-05-24 NOTE — Telephone Encounter (Signed)
 Requested Prescriptions   Pending Prescriptions Disp Refills   temazepam  (RESTORIL ) 15 MG capsule 30 capsule 0    Sig: TAKE 1 CAPSULE BY MOUTH AT BEDTIME AS NEEDED FOR SLEEP     Date of patient request: 05/24/2023 Last office visit: 05/08/2023 Upcoming visit: 11/14/2023 Date of last refill: 04/26/2023 Last refill amount: 30

## 2023-05-24 NOTE — Telephone Encounter (Signed)
 Medication discussed at her April 16 visit.  Controlled substance database reviewed.  Restoril  No. 30 last filled on 04/27/2023, refill ordered.

## 2023-05-27 ENCOUNTER — Ambulatory Visit (INDEPENDENT_AMBULATORY_CARE_PROVIDER_SITE_OTHER): Admitting: Gastroenterology

## 2023-05-28 ENCOUNTER — Other Ambulatory Visit: Payer: Self-pay | Admitting: Family Medicine

## 2023-05-28 DIAGNOSIS — F330 Major depressive disorder, recurrent, mild: Secondary | ICD-10-CM

## 2023-05-29 ENCOUNTER — Other Ambulatory Visit (HOSPITAL_COMMUNITY)
Admission: RE | Admit: 2023-05-29 | Discharge: 2023-05-29 | Disposition: A | Discharge status: Home / Self Care | Source: Ambulatory Visit | Attending: Gastroenterology | Admitting: Gastroenterology

## 2023-05-29 DIAGNOSIS — R1319 Other dysphagia: Secondary | ICD-10-CM | POA: Insufficient documentation

## 2023-05-29 LAB — PREGNANCY, URINE: Preg Test, Ur: NEGATIVE

## 2023-06-03 ENCOUNTER — Telehealth (INDEPENDENT_AMBULATORY_CARE_PROVIDER_SITE_OTHER): Payer: Self-pay | Admitting: Gastroenterology

## 2023-06-03 NOTE — Telephone Encounter (Signed)
 Pt called in and states she needs to cancel EGD that is scheduled for 06/05/23 due to mom being sick and she is taking care of her. Pt states she will call back to reschedule.

## 2023-06-05 ENCOUNTER — Encounter (HOSPITAL_COMMUNITY): Admission: RE | Payer: Self-pay | Source: Home / Self Care

## 2023-06-05 ENCOUNTER — Ambulatory Visit (HOSPITAL_COMMUNITY): Admission: RE | Admit: 2023-06-05 | Source: Home / Self Care | Admitting: Gastroenterology

## 2023-06-05 SURGERY — EGD (ESOPHAGOGASTRODUODENOSCOPY)
Anesthesia: Choice

## 2023-06-21 ENCOUNTER — Other Ambulatory Visit: Payer: Self-pay | Admitting: Internal Medicine

## 2023-06-24 ENCOUNTER — Other Ambulatory Visit: Payer: Self-pay | Admitting: Family Medicine

## 2023-06-24 DIAGNOSIS — G4700 Insomnia, unspecified: Secondary | ICD-10-CM

## 2023-06-26 ENCOUNTER — Telehealth: Payer: Self-pay

## 2023-06-26 DIAGNOSIS — G4700 Insomnia, unspecified: Secondary | ICD-10-CM

## 2023-06-26 MED ORDER — TEMAZEPAM 15 MG PO CAPS: ORAL_CAPSULE | ORAL | 0 refills | Status: DC

## 2023-06-26 NOTE — Addendum Note (Signed)
 Addended by: Monique Hefty R on: 06/26/2023 05:31 PM   Modules accepted: Orders

## 2023-06-26 NOTE — Telephone Encounter (Signed)
 Insomnia discussed at her April 16 visit.  Continued on temazepam .  Controlled substance database reviewed.  Temazepam  15 mg #30 last filled on 05/25/2023.  Noted her request for different pharmacy.  I will send current prescription to [facility] drug pharmacy, please advise patient.  Thanks

## 2023-06-26 NOTE — Telephone Encounter (Signed)
 Copied from CRM [id number]. Topic: Clinical - Prescription Issue >> Jun 26, 2023 11:13 AM Elita Guitar wrote: Reason for CRM: Patient called In and requested for her prescription, Temazepam , to be routed to [facility] pharmacy because for 6 months, [facility] has been having issues with their system and they can't supply her with it. She would like all of her future refills, for Temazepam  only, to be filled at [facility] pharmacy to avoid this issue with [facility].   [facility] [place] Address: [address] [place], [place] [address] Phone: [phone]

## 2023-06-27 NOTE — Telephone Encounter (Signed)
 Called patient to inform her the prescription has been sent to the [facility] pharmacy, patient verbalized understanding.

## 2023-07-04 ENCOUNTER — Encounter: Payer: Self-pay | Admitting: Internal Medicine

## 2023-07-04 ENCOUNTER — Ambulatory Visit: Payer: BC Managed Care – PPO | Admitting: Internal Medicine

## 2023-07-04 VITALS — BP 126/78 | HR 77 | Ht 60.0 in | Wt 176.0 lb

## 2023-07-04 DIAGNOSIS — R809 Proteinuria, unspecified: Secondary | ICD-10-CM

## 2023-07-04 DIAGNOSIS — Z7984 Long term (current) use of oral hypoglycemic drugs: Secondary | ICD-10-CM | POA: Diagnosis not present

## 2023-07-04 DIAGNOSIS — E1129 Type 2 diabetes mellitus with other diabetic kidney complication: Secondary | ICD-10-CM | POA: Diagnosis not present

## 2023-07-04 DIAGNOSIS — E063 Autoimmune thyroiditis: Secondary | ICD-10-CM

## 2023-07-04 DIAGNOSIS — E119 Type 2 diabetes mellitus without complications: Secondary | ICD-10-CM | POA: Diagnosis not present

## 2023-07-04 LAB — POCT GLYCOSYLATED HEMOGLOBIN (HGB A1C): Hemoglobin A1C: 6.2 % — AB (ref 4.0–5.6)

## 2023-07-04 LAB — POCT GLUCOSE (DEVICE FOR HOME USE): Glucose Fasting, POC: 133 mg/dL — AB (ref 70–99)

## 2023-07-04 NOTE — Progress Notes (Signed)
 Name: Beverly Atkinson  MRN/ DOB: [id number], September 22, 1980    Age/ Sex: 43 y.o., female    PCP: Benjiman Bras, MD   Reason for Endocrinology Evaluation: Hypothyroidism     Date of Initial Endocrinology Evaluation: 01/01/2022    HPI: Beverly Atkinson is a 43 y.o. female with a past medical history of Hypothyroidism . The patient presented for initial endocrinology clinic visit on 01/01/2022  for consultative assistance with her Hypothyroidism   She was diagnosed with hypothyroidism  at ~ age 71. She was on Synthroid  but due to insurance coverage , this was switched to levothyroxine  but her TSH was difficult to control   She was started on NP thyroid  through [facility] ~ a couple of  years    Patient has been noted with low TSH since 2019 with a nadir of 0.00 uIU/mL in January 2023    Denies XRT  She is on Thyroid   Complex that she takes every other day and it makes her feel better    Mother and paternal grandmother with thyroid  disease    Of note the patient is on NP thyroid  90 mg daily    Of note, the pt was diagnosed with PCOS in the past. She has tried multiple COC's without much help . She has been focusing on diet and life style changes .  She follows with GYN.  She gets menstruations monthly (more every 3 weeks) She has also been diagnosed with pre-diabetes , last A1c 6.0 %     DIABETIC HISTORY:  Beverly Atkinson was diagnosed with DM  06/2022. Her  hemoglobin A1c was 6.8% at time of diagnosis.  I have attempted to prescribe Ozempic  but this was declined by her insurance, and she was started on metformin  instead    HYPOKALEMIA : 24-hour urinary cortisol was normal 06/2022 including normal renin and low aldosterone.  SUBJECTIVE:   During the last visit (01/03/2023): A1c 5.6%     Today (07/04/23):  Beverly Atkinson is here for a follow up on Hypothyroidism, and diabetes.  She checks her glucose 1 times daily, no hypoglycemia   She was evaluated by GI  [facility]) for worsening dysphagia, EGD was scheduled, continued on PPI  She continues with regurgitation Denies loose stools or diarrhea  Denies tremors  Denies palpitations  She is on norethindrone through Gyn with endometriosis pain       HOME ENDOCRINE  REGIMEN:  NP thyroid  90 mg daily Metformin  500 XR, 1 tab daily     Statin: yes ACE-I/ARB: yes    METER DOWNLOAD SUMMARY: n/a    DIABETIC COMPLICATIONS: Microvascular complications:  Microalbuminuria  Denies:  Last Eye Exam: Completed 2024  Macrovascular complications:   Denies: CAD, CVA, PVD      HISTORY:  Past Medical History:  Past Medical History:  Diagnosis Date   Arthritis    Depression    Heart murmur    Insomnia    Restless leg    Thyroid  disease    Past Surgical History:  Past Surgical History:  Procedure Laterality Date   CHOLECYSTECTOMY     2015   EYE SURGERY     Lazy eye at age 65   LAPAROSCOPIC ENDOMETRIOSIS FULGURATION  08/23/2015    Social History:  reports that she has never smoked. She has never used smokeless tobacco. She reports current alcohol use. She reports that she does not use drugs. Family History: family history includes Alcohol abuse in her father and  mother; Arthritis in her father and paternal grandmother; Cancer in her father; Drug abuse in her father, mother, and sister; Heart disease in her father; Hyperlipidemia in her father; Hypertension in her father; Mental illness in her mother; Thyroid  disease in her mother.   HOME MEDICATIONS: Allergies as of 07/04/2023       Reactions   Ace Inhibitors Cough   Clarithromycin Rash        Medication List        Accurate as of July 04, 2023 10:36 AM. If you have any questions, ask your nurse or doctor.          Calcium  Carbonate-Vitamin D 300-100 MG-UNIT Caps Take by mouth.   metFORMIN  500 MG 24 hr tablet Commonly known as: GLUCOPHAGE -XR Take 1 tablet (500 mg total) by mouth daily with  breakfast.   MULTIVITAMIN PO Take by mouth daily at 6 (six) AM.   NON FORMULARY   norethindrone 5 MG tablet Commonly known as: AYGESTIN Take 5 mg by mouth daily.   ondansetron  4 MG tablet Commonly known as: Zofran  Take 1 tablet (4 mg total) by mouth every 8 (eight) hours as needed for nausea or vomiting.   OneTouch Verio test strip Generic drug: glucose blood 1 each by Other route daily in the afternoon. Use as instructed   OneTouch Verio w/Device Kit 1 Device by Does not apply route daily in the afternoon.   OVER THE COUNTER MEDICATION Take 1 capsule by mouth daily as needed. [employer] Thyroid  complex.   rosuvastatin  5 MG tablet Commonly known as: CRESTOR  Take 1 tablet (5 mg total) by mouth daily.   sertraline  100 MG tablet Commonly known as: ZOLOFT  Take 1 tablet by mouth once daily   temazepam  15 MG capsule Commonly known as: RESTORIL  TAKE 1 CAPSULE BY MOUTH AT BEDTIME AS NEEDED FOR SLEEP   thyroid  90 MG tablet Commonly known as: NP Thyroid  Take 1 tablet (90 mg total) by mouth daily.   traMADol  50 MG tablet Commonly known as: ULTRAM  Take 1 tablet (50 mg total) by mouth every 6 (six) hours as needed.   valsartan  80 MG tablet Commonly known as: DIOVAN  Take 1 tablet (80 mg total) by mouth daily.   VITAMIN B COMPLEX PO Take by mouth daily at 6 (six) AM.          REVIEW OF SYSTEMS: A comprehensive ROS was conducted with the patient and is negative except as per HPI     OBJECTIVE:  VS: BP 126/78 (BP Location: Left Arm, Patient Position: Sitting, Cuff Size: Normal)   Pulse 77   Ht 5' (1.524 m)   Wt 176 lb (79.8 kg)   SpO2 99%   BMI 34.37 kg/m    Wt Readings from Last 3 Encounters:  07/04/23 176 lb (79.8 kg)  05/09/23 184 lb 9.6 oz (83.7 kg)  05/08/23 179 lb 12.8 oz (81.6 kg)     EXAM: General: Pt appears well and is in NAD  Neck: General: Supple without adenopathy. Thyroid : Thyroid  size normal.  No goiter or nodules appreciated.   Lungs: Clear with good BS bilat   Heart: Auscultation: RRR.  Extremities:  BL LE: No pretibial edema   Mental Status: Judgment, insight: Intact Orientation: Oriented to time, place, and person Mood and affect: No depression, anxiety, or agitation   DM Foot Exam 01/03/2023  The skin of the feet is intact without sores or ulcerations. The pedal pulses are 2+ on right and 2+ on left. The  sensation is intact to a screening 5.07, 10 gram monofilament bilaterally    DATA REVIEWED:   Latest Reference Range & Units 07/04/23 10:53  Microalb, Ur mg/dL 3.5  MICROALB/CREAT RATIO <30 mg/g creat 26  Creatinine, Urine 20 - 275 mg/dL 528    Latest Reference Range & Units 01/03/23 11:36  Sodium 135 - 146 mmol/L 139  Potassium 3.5 - 5.3 mmol/L 3.9  Chloride 98 - 110 mmol/L 102  CO2 20 - 32 mmol/L 27  Glucose 65 - 99 mg/dL 413 (H)  BUN 7 - 25 mg/dL 11  Creatinine 2.44 - 0.10 mg/dL 2.72  Calcium  8.6 - 10.2 mg/dL 9.8  BUN/Creatinine Ratio 6 - 22 (calc) SEE NOTE:  AG Ratio 1.0 - 2.5 (calc) 1.5  AST 10 - 30 U/L 20  ALT 6 - 29 U/L 24  Total Protein 6.1 - 8.1 g/dL 7.3  Total Bilirubin 0.2 - 1.2 mg/dL 0.5  Total CHOL/HDL Ratio <5.0 (calc) 5.0 (H)  Cholesterol <200 mg/dL 536  HDL Cholesterol > OR = 50 mg/dL 40 (L)  LDL Cholesterol (Calc) mg/dL (calc) 644 (H)  MICROALB/CREAT RATIO <30 mg/g creat 52 (H)  Non-HDL Cholesterol (Calc) <130 mg/dL (calc) 034 (H)  Triglycerides <150 mg/dL 742 (H)    Latest Reference Range & Units 01/03/23 11:36  TSH mIU/L 1.18  Albumin MSPROF 3.6 - 5.1 g/dL 4.4    Latest Reference Range & Units 01/03/23 11:36  Microalb, Ur mg/dL 6.5  MICROALB/CREAT RATIO <30 mg/g creat 52 (H)  Creatinine, Urine 20 - 275 mg/dL 595       Latest Reference Range & Units 10/17/22 11:13  Sodium 135 - 145 mEq/L 139  Potassium 3.5 - 5.1 mEq/L 3.5  Chloride 96 - 112 mEq/L 108  CO2 19 - 32 mEq/L 21  Glucose 70 - 99 mg/dL 80  BUN 6 - 23 mg/dL 14  Creatinine 6.38 - 7.56 mg/dL 4.33   Calcium  8.4 - 10.5 mg/dL 9.1  Alkaline Phosphatase 39 - 117 U/L 46  Albumin 3.5 - 5.2 g/dL 4.0  AST 0 - 37 U/L 19  ALT 0 - 35 U/L 16  Total Protein 6.0 - 8.3 g/dL 7.0  Total Bilirubin 0.2 - 1.2 mg/dL 0.6  GFR >29.51 mL/min 79.15  Total CHOL/HDL Ratio  8  Cholesterol 0 - 200 mg/dL 884 (H)  HDL Cholesterol >39.00 mg/dL 16.60 (L)  LDL (calc) 0 - 99 mg/dL 630 (H)  NonHDL  160.10  Triglycerides 0.0 - 149.0 mg/dL 932.3 (H)  VLDL 0.0 - 55.7 mg/dL 32.2   In office BG 025 Mg/DL   Latest Reference Range & Units Most Recent  Thyroperoxidase Ab SerPl-aCnc 0 - 34 IU/mL 120 (H) 03/18/17 10:16     ASSESSMENT/PLAN/RECOMMENDATIONS:   1) Type 2 Diabetes Mellitus, Optimally controlled, With Hx  microalbuminuria complications - Most recent A1c of 6.2 %. Goal A1c < 7.0 %.    - A1c remains optimal -We have entertained SGLT2 inhibitors, should she continue with microalbuminuria - Microalbuminuria has resolved   MEDICATIONS: Continue metformin  500 mg XR, 1 tablet daily  EDUCATION / INSTRUCTIONS: BG monitoring instructions: Patient is instructed to check her  blood sugars 2-3 times a week. Call [place] [facility] clinic if: BG persistently < 70  I reviewed the Rule of 15 for the treatment of hypoglycemia in detail with the patient. Literature supplied.    2) Diabetic complications:  Eye: Does not have known diabetic retinopathy.  Neuro/ Feet: Does not have known diabetic peripheral neuropathy .  Renal: Patient does not have known baseline CKD. She  is  on an ACEI/ARB at present.     3) Hashimoto's thyroiditis :  -Patient is clinically euthyroid -TSH normal - No change  Medications : NP thyroid  90 mg daily   4) Dyslipidemia :   -LDL above goal -I had increased her atorvastatin  12/2022 but she developed headaches, cardiology switched to rosuvastatin  -Will defer further management to cardiology   5) Microalbuminuria:  -This is a slight elevation -She is already on  Diovan  - Microalbuminuria has resolved    F/U in 6 months    Signed electronically by: Natale Bail, MD  [facility]  [facility] [address] [place] [place], [place] [address] Phone: [phone] FAX: [phone]   CC: Benjiman Bras, MD [address] [place] [address] Phone: [phone] Fax: [phone]   Return to Endocrinology clinic as below: Future Appointments  Date Time Provider Department Center  07/04/2023 10:50 AM Lurleen Soltero, Julian Obey, MD [facility] None  08/27/2023  2:45 PM Patterson Bora, NP [facility] None  11/14/2023 10:00 AM Benjiman Bras, MD [facility]

## 2023-07-05 ENCOUNTER — Ambulatory Visit: Payer: Self-pay | Admitting: Internal Medicine

## 2023-07-05 LAB — MICROALBUMIN / CREATININE URINE RATIO
Creatinine, Urine: 134 mg/dL (ref 20–275)
Microalb Creat Ratio: 26 mg/g{creat} (ref <30)
Microalb, Ur: 3.5 mg/dL

## 2023-07-29 ENCOUNTER — Other Ambulatory Visit: Payer: Self-pay | Admitting: Family Medicine

## 2023-07-29 DIAGNOSIS — G4700 Insomnia, unspecified: Secondary | ICD-10-CM

## 2023-07-31 ENCOUNTER — Other Ambulatory Visit: Payer: Self-pay | Admitting: Family Medicine

## 2023-07-31 DIAGNOSIS — G4700 Insomnia, unspecified: Secondary | ICD-10-CM

## 2023-07-31 MED ORDER — TEMAZEPAM 15 MG PO CAPS
15.0000 mg | ORAL_CAPSULE | Freq: Every evening | ORAL | 0 refills | Status: DC | PRN
Start: 2023-07-31 — End: 2023-09-03

## 2023-07-31 NOTE — Telephone Encounter (Signed)
 Medication not rcvd by pharmacy      Copied from CRM [id number]. Topic: Clinical - Prescription Issue >> Jul 31, 2023 12:31 PM Drema MATSU wrote: Reason for CRM: Patient states that she needs a refill on temazepam  (RESTORIL ) 15 MG capsule. I advised patient that medication was discontinued and she is needing a refill. Please contact patient, she will run out of Friday.

## 2023-07-31 NOTE — Telephone Encounter (Signed)
 Rx that was sent on 07/29/2023 was printed not electronically transmitted

## 2023-07-31 NOTE — Telephone Encounter (Signed)
 Controlled substance database reviewed.  Temazepam  15 mg #30 last filled on 06/26/2023.Medication discussed in April, refill ordered.

## 2023-08-27 ENCOUNTER — Ambulatory Visit (INDEPENDENT_AMBULATORY_CARE_PROVIDER_SITE_OTHER): Admitting: Gastroenterology

## 2023-09-03 ENCOUNTER — Other Ambulatory Visit: Payer: Self-pay | Admitting: Family Medicine

## 2023-09-03 DIAGNOSIS — G4700 Insomnia, unspecified: Secondary | ICD-10-CM

## 2023-09-03 MED ORDER — TEMAZEPAM 15 MG PO CAPS
15.0000 mg | ORAL_CAPSULE | Freq: Every evening | ORAL | 0 refills | Status: DC | PRN
Start: 2023-09-03 — End: 2023-10-03

## 2023-09-03 NOTE — Telephone Encounter (Signed)
 Requested Prescriptions   Pending Prescriptions Disp Refills   temazepam  (RESTORIL ) 15 MG capsule 30 capsule 0    Sig: Take 1 capsule (15 mg total) by mouth at bedtime as needed. for sleep     Date of patient request: 09/03/2023 Last office visit: 05/08/2023 Upcoming visit: 11/14/2023 Date of last refill: 07/31/2023 Last refill amount: 30

## 2023-09-03 NOTE — Telephone Encounter (Unsigned)
 Copied from CRM [id number]. Topic: Clinical - Medication Refill >> Sep 03, 2023 10:46 AM [place] E wrote: Medication: temazepam  (RESTORIL ) 15 MG capsule  Has the patient contacted their pharmacy? No (Agent: If no, request that the patient contact the pharmacy for the refill. If patient does not wish to contact the pharmacy document the reason why and proceed with request.) (Agent: If yes, when and what did the pharmacy advise?)  This is the patient's preferred pharmacy:   (Patient needs it sent to this location as she is out of town visiting her father)  [facility]  [address] [place], [place] [address] Phone: [phone]  Is this the correct pharmacy for this prescription? Yes If no, delete pharmacy and type the correct one.   Has the prescription been filled recently? No  Is the patient out of the medication? No, 1 pill left.  Has the patient been seen for an appointment in the last year OR does the patient have an upcoming appointment? Yes  Can we respond through MyChart? Yes  Agent: Please be advised that Rx refills may take up to 3 business days. We ask that you follow-up with your pharmacy.

## 2023-09-20 ENCOUNTER — Other Ambulatory Visit: Payer: Self-pay | Admitting: Family Medicine

## 2023-09-20 DIAGNOSIS — F330 Major depressive disorder, recurrent, mild: Secondary | ICD-10-CM

## 2023-10-03 ENCOUNTER — Other Ambulatory Visit: Payer: Self-pay | Admitting: Family Medicine

## 2023-10-03 DIAGNOSIS — G4700 Insomnia, unspecified: Secondary | ICD-10-CM

## 2023-10-03 NOTE — Telephone Encounter (Signed)
 Requested Prescriptions   Pending Prescriptions Disp Refills   temazepam  (RESTORIL ) 15 MG capsule [Pharmacy Med Name: Temazepam  15 MG Oral Capsule] 30 capsule 0    Sig: TAKE 1 CAPSULE BY MOUTH AT BEDTIME AS NEEDED FOR SLEEP     Date of patient request: 10/03/23 Last office visit: 05/08/2023 Upcoming visit: 11/14/2023 Date of last refill: 09/03/23 Last refill amount: 30 days

## 2023-10-03 NOTE — Telephone Encounter (Signed)
 Insomnia discussed at her April 16 visit, 29-month follow-up planned.  Controlled substance database reviewed.  Temazepam  15 mg #30 last filled on 07/31/2023, consistent refills previously.  No concerns, refill ordered.

## 2023-10-29 ENCOUNTER — Other Ambulatory Visit: Payer: Self-pay | Admitting: Family Medicine

## 2023-10-29 ENCOUNTER — Other Ambulatory Visit: Payer: Self-pay

## 2023-10-29 DIAGNOSIS — G4700 Insomnia, unspecified: Secondary | ICD-10-CM

## 2023-10-29 MED ORDER — TEMAZEPAM 15 MG PO CAPS: 15.0000 mg | ORAL_CAPSULE | Freq: Every evening | ORAL | 0 refills | Status: DC | PRN

## 2023-10-29 MED ORDER — TEMAZEPAM 15 MG PO CAPS
15.0000 mg | ORAL_CAPSULE | Freq: Every evening | ORAL | 0 refills | Status: DC | PRN
Start: 2023-10-29 — End: 2023-12-03

## 2023-10-29 NOTE — Telephone Encounter (Signed)
 Copied from CRM [id number]. Topic: Clinical - Medication Refill >> Oct 29, 2023 12:13 PM Ahlexyia S wrote: Medication: temazepam  (RESTORIL ) 15 MG capsule  Has the patient contacted their pharmacy? No, pt is requesting med to be sent to a different pharmacy.  (Agent: If no, request that the patient contact the pharmacy for the refill. If patient does not wish to contact the pharmacy document the reason why and proceed with request.) (Agent: If yes, when and what did the pharmacy advise?)  This is the patient's preferred pharmacy:  [facility] [address] [place] - [name] [place] [address] Phone: [phone] Fax: [phone]  Is this the correct pharmacy for this prescription? Yes If no, delete pharmacy and type the correct one.   Has the prescription been filled recently? Yes, last time was Sept 11th.  Is the patient out of the medication? No, limited supply remaining  Has the patient been seen for an appointment in the last year OR does the patient have an upcoming appointment? Yes  Can we respond through MyChart? Yes  Agent: Please be advised that Rx refills may take up to 3 business days. We ask that you follow-up with your pharmacy.

## 2023-10-29 NOTE — Telephone Encounter (Signed)
 Controlled substance database reviewed.  Temazepam  No. 30 last filled on September 11.  Previously filled July 9, June 4, May 3.  Upcoming appointment on October 23.  Meds discussed in April.  Refill ordered.

## 2023-10-29 NOTE — Telephone Encounter (Signed)
 Requested Prescriptions   Pending Prescriptions Disp Refills   temazepam  (RESTORIL ) 15 MG capsule 30 capsule 0    Sig: Take 1 capsule (15 mg total) by mouth at bedtime as needed for sleep.     Date of patient request: 10/29/23 Last office visit: Visit date not found Upcoming visit: 11/14/23 Last refill amount: 30 days

## 2023-11-01 ENCOUNTER — Other Ambulatory Visit: Payer: Self-pay | Admitting: Family

## 2023-11-01 DIAGNOSIS — G4700 Insomnia, unspecified: Secondary | ICD-10-CM

## 2023-11-01 NOTE — Telephone Encounter (Signed)
 Copied from CRM [id number]. Topic: Clinical - Prescription Issue >> Nov 01, 2023 12:25 PM [place] wrote: Reason for CRM: temazepam  (RESTORIL ) 15 MG capsule   Needing to have medication sent to [facility] [address] [place] - [name] [place] [address] Phone: [phone] Fax: [phone]

## 2023-11-01 NOTE — Telephone Encounter (Unsigned)
 Copied from CRM #[id number]. Topic: Clinical - Prescription Issue >> Nov 01, 2023  9:26 AM Beverly Atkinson wrote: Reason for CRM: Patient called in to follow up on refill request temazepam  (RESTORIL ) 15 MG capsule advised approved and sent 10/7. pharmacy is stating they have not received anything

## 2023-11-06 ENCOUNTER — Encounter (INDEPENDENT_AMBULATORY_CARE_PROVIDER_SITE_OTHER): Payer: Self-pay | Admitting: Gastroenterology

## 2023-11-14 ENCOUNTER — Ambulatory Visit: Admitting: Family Medicine

## 2023-11-14 ENCOUNTER — Encounter: Payer: Self-pay | Admitting: Family Medicine

## 2023-11-14 VITALS — BP 122/88 | HR 69 | Temp 98.0°F | Ht 60.0 in | Wt 173.2 lb

## 2023-11-14 DIAGNOSIS — F330 Major depressive disorder, recurrent, mild: Secondary | ICD-10-CM | POA: Diagnosis not present

## 2023-11-14 DIAGNOSIS — M545 Low back pain, unspecified: Secondary | ICD-10-CM

## 2023-11-14 DIAGNOSIS — E785 Hyperlipidemia, unspecified: Secondary | ICD-10-CM

## 2023-11-14 DIAGNOSIS — E1169 Type 2 diabetes mellitus with other specified complication: Secondary | ICD-10-CM

## 2023-11-14 DIAGNOSIS — I10 Essential (primary) hypertension: Secondary | ICD-10-CM | POA: Diagnosis not present

## 2023-11-14 DIAGNOSIS — Z0000 Encounter for general adult medical examination without abnormal findings: Secondary | ICD-10-CM | POA: Diagnosis not present

## 2023-11-14 DIAGNOSIS — G4700 Insomnia, unspecified: Secondary | ICD-10-CM | POA: Diagnosis not present

## 2023-11-14 LAB — LIPID PANEL
Cholesterol: 175 mg/dL (ref 0–200)
HDL: 43.8 mg/dL (ref >39.00)
LDL Cholesterol: 107 mg/dL — ABNORMAL HIGH (ref 0–99)
NonHDL: 131.07
Total CHOL/HDL Ratio: 4
Triglycerides: 121 mg/dL (ref 0.0–149.0)
VLDL: 24.2 mg/dL (ref 0.0–40.0)

## 2023-11-14 LAB — COMPREHENSIVE METABOLIC PANEL WITH GFR
ALT: 26 U/L (ref 0–35)
AST: 24 U/L (ref 0–37)
Albumin: 4.3 g/dL (ref 3.5–5.2)
Alkaline Phosphatase: 68 U/L (ref 39–117)
BUN: 13 mg/dL (ref 6–23)
CO2: 28 meq/L (ref 19–32)
Calcium: 9.4 mg/dL (ref 8.4–10.5)
Chloride: 105 meq/L (ref 96–112)
Creatinine, Ser: 0.74 mg/dL (ref 0.40–1.20)
GFR: 98.03 mL/min (ref >60.00)
Glucose, Bld: 87 mg/dL (ref 70–99)
Potassium: 4.1 meq/L (ref 3.5–5.1)
Sodium: 141 meq/L (ref 135–145)
Total Bilirubin: 0.5 mg/dL (ref 0.2–1.2)
Total Protein: 7.2 g/dL (ref 6.0–8.3)

## 2023-11-14 LAB — CBC
HCT: 41.7 % (ref 36.0–46.0)
Hemoglobin: 13.8 g/dL (ref 12.0–15.0)
MCHC: 33.1 g/dL (ref 30.0–36.0)
MCV: 88.5 fl (ref 78.0–100.0)
Platelets: 325 K/uL (ref 150.0–400.0)
RBC: 4.71 Mil/uL (ref 3.87–5.11)
RDW: 13.7 % (ref 11.5–15.5)
WBC: 6.9 K/uL (ref 4.0–10.5)

## 2023-11-14 MED ORDER — SERTRALINE HCL 100 MG PO TABS: 100.0000 mg | ORAL_TABLET | Freq: Every day | ORAL | 3 refills | Status: AC

## 2023-11-14 MED ORDER — TRAMADOL HCL 50 MG PO TABS: 50.0000 mg | ORAL_TABLET | Freq: Four times a day (QID) | ORAL | 0 refills | Status: AC | PRN

## 2023-11-14 NOTE — Patient Instructions (Addendum)
 Call Dr. Lonni for follow up appointment and to discuss options for cholesterol meds.  Call gynecology for follow up appointment, and to schedule mammogram.  Follow up with gastroenterology when you are able to continue the workup for trouble swallowing.  Call eye specialist for appointment.  Try to schedule appointments today so those are in the books.   Thank you for coming in today. No change in medications at this time. If there are any concerns on your bloodwork, I will let you know. Take care!  Preventive Care 66-34 Years Old, Female Preventive care refers to lifestyle choices and visits with your health care provider that can promote health and wellness. Preventive care visits are also called wellness exams. What can I expect for my preventive care visit? Counseling Your health care provider may ask you questions about your: Medical history, including: Past medical problems. Family medical history. Pregnancy history. Current health, including: Menstrual cycle. Method of birth control. Emotional well-being. Home life and relationship well-being. Sexual activity and sexual health. Lifestyle, including: Alcohol, nicotine or tobacco, and drug use. Access to firearms. Diet, exercise, and sleep habits. Work and work Astron[employer]. Sunscreen use. Safety issues such as seatbelt and bike helmet use. Physical exam Your health care provider will check your: Height and weight. These may be used to calculate your BMI (body mass index). BMI is a measurement that tells if you are at a healthy weight. Waist circumference. This measures the distance around your waistline. This measurement also tells if you are at a healthy weight and may help predict your risk of certain diseases, such as type 2 diabetes and high blood pressure. Heart rate and blood pressure. Body temperature. Skin for abnormal spots. What immunizations do I need?  Vaccines are usually given at various ages,  according to a schedule. Your health care provider will recommend vaccines for you based on your age, medical history, and lifestyle or other factors, such as travel or where you work. What tests do I need? Screening Your health care provider may recommend screening tests for certain conditions. This may include: Lipid and cholesterol levels. Diabetes screening. This is done by checking your blood sugar (glucose) after you have not eaten for a while (fasting). Pelvic exam and Pap test. Hepatitis B test. Hepatitis C test. HIV (human immunodeficiency virus) test. STI (sexually transmitted infection) testing, if you are at risk. Lung cancer screening. Colorectal cancer screening. Mammogram. Talk with your health care provider about when you should start having regular mammograms. This may depend on whether you have a family history of breast cancer. BRCA-related cancer screening. This may be done if you have a family history of breast, ovarian, tubal, or peritoneal cancers. Bone density scan. This is done to screen for osteoporosis. Talk with your health care provider about your test results, treatment options, and if necessary, the need for more tests. Follow these instructions at home: Eating and drinking  Eat a diet that includes fresh fruits and vegetables, whole grains, lean protein, and low-fat dairy products. Take vitamin and mineral supplements as recommended by your health care provider. Do not drink alcohol if: Your health care provider tells you not to drink. You are pregnant, may be pregnant, or are planning to become pregnant. If you drink alcohol: Limit how much you have to 0-1 drink a day. Know how much alcohol is in your drink. In the [place] one drink equals one 12 oz bottle of beer (355 mL), one 5 oz glass of wine (148 mL), or  one 1 oz glass of hard liquor (44 mL). Lifestyle Brush your teeth every morning and night with fluoride toothpaste. Floss one time each  day. Exercise for at least 30 minutes 5 or more days each week. Do not use any products that contain nicotine or tobacco. These products include cigarettes, chewing tobacco, and vaping devices, such as e-cigarettes. If you need help quitting, ask your health care provider. Do not use drugs. If you are sexually active, practice safe sex. Use a condom or other form of protection to prevent STIs. If you do not wish to become pregnant, use a form of birth control. If you plan to become pregnant, see your health care provider for a prepregnancy visit. Take aspirin only as told by your health care provider. Make sure that you understand how much to take and what form to take. Work with your health care provider to find out whether it is safe and beneficial for you to take aspirin daily. Find healthy ways to manage stress, such as: Meditation, yoga, or listening to music. Journaling. Talking to a trusted person. Spending time with friends and family. Minimize exposure to UV radiation to reduce your risk of skin cancer. Safety Always wear your seat belt while driving or riding in a vehicle. Do not drive: If you have been drinking alcohol. Do not ride with someone who has been drinking. When you are tired or distracted. While texting. If you have been using any mind-altering substances or drugs. Wear a helmet and other protective equipment during sports activities. If you have firearms in your house, make sure you follow all gun safety procedures. Seek help if you have been physically or sexually abused. What's next? Visit your health care provider once a year for an annual wellness visit. Ask your health care provider how often you should have your eyes and teeth checked. Stay up to date on all vaccines. This information is not intended to replace advice given to you by your health care provider. Make sure you discuss any questions you have with your health care provider. Document Revised:  07/06/2020 Document Reviewed: 07/06/2020 [employer] Patient Education  2024 [employer].
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
## 2023-11-19 ENCOUNTER — Ambulatory Visit: Payer: Self-pay | Admitting: Family Medicine

## 2023-11-25 NOTE — Progress Notes (Signed)
 Blood counts were normal.  Electrolytes kidney liver test looked okay.  LDL cholesterol was a few points elevated but not concerning compared to the previous readings.  No med changes for now.  Let me know if you have questions.   LVM to call or via MyChart

## 2023-11-26 NOTE — Progress Notes (Signed)
 Lab results have been discussed.   Verbalized understanding? Yes  Are there any questions? No

## 2023-12-02 ENCOUNTER — Other Ambulatory Visit: Payer: Self-pay | Admitting: Family Medicine

## 2023-12-02 DIAGNOSIS — G4700 Insomnia, unspecified: Secondary | ICD-10-CM

## 2023-12-02 NOTE — Telephone Encounter (Signed)
 Requested Prescriptions   Pending Prescriptions Disp Refills   temazepam  (RESTORIL ) 15 MG capsule [Pharmacy Med Name: Temazepam  15 MG Oral Capsule] 30 capsule 0    Sig: TAKE 1 CAPSULE BY MOUTH AT BEDTIME AS NEEDED FOR SLEEP     Date of patient request: 12/02/2023 Last office visit: 11/14/2023 Upcoming visit: 05/14/2024 Date of last refill: 10/29/2023 Last refill amount: 30

## 2024-01-01 ENCOUNTER — Other Ambulatory Visit: Payer: Self-pay | Admitting: Student in an Organized Health Care Education/Training Program

## 2024-01-01 DIAGNOSIS — G4700 Insomnia, unspecified: Secondary | ICD-10-CM

## 2024-01-01 NOTE — Telephone Encounter (Signed)
 Requested Prescriptions   Pending Prescriptions Disp Refills   temazepam  (RESTORIL ) 15 MG capsule [Pharmacy Med Name: Temazepam  15 MG Oral Capsule] 30 capsule 0    Sig: TAKE 1 CAPSULE BY MOUTH AT BEDTIME AS NEEDED FOR SLEEP     Date of patient request: 01/11/24 Last office visit: Visit date not found Upcoming visit: 05/14/24 Date of last refill: 12/03/23 Last refill amount: 30

## 2024-01-01 NOTE — Telephone Encounter (Signed)
 Medication discussed at November 10 visit.  Controlled substance database reviewed.  Temazepam  15 mg #30 last filled on 12/03/2023.  Refill ordered.

## 2024-01-21 ENCOUNTER — Encounter: Payer: Self-pay | Admitting: Internal Medicine

## 2024-01-21 ENCOUNTER — Ambulatory Visit: Admitting: Internal Medicine

## 2024-01-21 ENCOUNTER — Other Ambulatory Visit

## 2024-01-21 VITALS — BP 122/82 | Ht 60.0 in | Wt 170.0 lb

## 2024-01-21 DIAGNOSIS — E063 Autoimmune thyroiditis: Secondary | ICD-10-CM | POA: Diagnosis not present

## 2024-01-21 DIAGNOSIS — Z7984 Long term (current) use of oral hypoglycemic drugs: Secondary | ICD-10-CM | POA: Diagnosis not present

## 2024-01-21 DIAGNOSIS — E1129 Type 2 diabetes mellitus with other diabetic kidney complication: Secondary | ICD-10-CM

## 2024-01-21 DIAGNOSIS — R809 Proteinuria, unspecified: Secondary | ICD-10-CM | POA: Diagnosis not present

## 2024-01-21 LAB — POCT GLYCOSYLATED HEMOGLOBIN (HGB A1C): Hemoglobin A1C: 6 % — AB (ref 4.0–5.6)

## 2024-01-21 MED ORDER — METFORMIN HCL ER 500 MG PO TB24: 500.0000 mg | ORAL_TABLET | Freq: Every day | ORAL | 3 refills | Status: AC

## 2024-01-21 NOTE — Progress Notes (Unsigned)
 "   Name: Beverly Atkinson  MRN/ DOB: [id number], December 12, 1980    Age/ Sex: 43 y.o., female    PCP: Levora Reyes SAUNDERS, MD   Reason for Endocrinology Evaluation: Hypothyroidism     Date of Initial Endocrinology Evaluation: 01/01/2022    HPI: Beverly Atkinson is a 43 y.o. female with a past medical history of Hypothyroidism . The patient presented for initial endocrinology clinic visit on 01/01/2022  for consultative assistance with her Hypothyroidism   She was diagnosed with hypothyroidism  at ~ age 56. She was on Synthroid  but due to insurance coverage , this was switched to levothyroxine  but her TSH was difficult to control   She was started on NP thyroid  through [facility] ~ a couple of  years    Patient has been noted with low TSH since 2019 with a nadir of 0.00 uIU/mL in January 2023    Denies XRT  She is on Thyroid   Complex that she takes every other day and it makes her feel better    Mother and paternal grandmother with thyroid  disease    Of note the patient is on NP thyroid  90 mg daily    Of note, the pt was diagnosed with PCOS in the past. She has tried multiple COC's without much help . She has been focusing on diet and life style changes .  She follows with GYN.  She gets menstruations monthly (more every 3 weeks) She has also been diagnosed with pre-diabetes , last A1c 6.0 %     DIABETIC HISTORY:  Beverly Atkinson was diagnosed with DM  06/2022. Her  hemoglobin A1c was 6.8% at time of diagnosis.  I have attempted to prescribe Ozempic  but this was declined by her insurance, and she was started on metformin  instead    HYPOKALEMIA : 24-hour urinary cortisol was normal 06/2022 including normal renin and low aldosterone.    SUBJECTIVE:   During the last visit (07/04/2023): A1c 6.2%     Today (01/21/2024):  Beverly Atkinson is here for a follow up on Hypothyroidism, and diabetes.  Historically show check glucose once daily, but her father had a big accident  required prolonged hospitalization and rehab, and has been busy and able to check glucose.  Father is home now and recovering well  No nausea or vomiting  No  diarrhea but ha occasional constipation  No palpitations  Denies tremors     HOME ENDOCRINE  REGIMEN:  NP thyroid  90 mg daily Metformin  500 XR, 1 tab daily   Statin: yes ACE-I/ARB: yes    METER DOWNLOAD SUMMARY: n/a    DIABETIC COMPLICATIONS: Microvascular complications:  Microalbuminuria  Denies:  Last Eye Exam: Completed 12/2023  Macrovascular complications:   Denies: CAD, CVA, PVD      HISTORY:  Past Medical History:  Past Medical History:  Diagnosis Date   Arthritis    Depression    Heart murmur    Insomnia    Restless leg    Thyroid  disease    Past Surgical History:  Past Surgical History:  Procedure Laterality Date   CHOLECYSTECTOMY     2015   EYE SURGERY     Lazy eye at age 68   LAPAROSCOPIC ENDOMETRIOSIS FULGURATION  08/23/2015    Social History:  reports that she has never smoked. She has never used smokeless tobacco. She reports current alcohol use. She reports that she does not use drugs. Family History: family history includes Alcohol abuse in her father and  mother; Arthritis in her father and paternal grandmother; Cancer in her father; Drug abuse in her father, mother, and sister; Heart disease in her father; Hyperlipidemia in her father; Hypertension in her father; Mental illness in her mother; Thyroid  disease in her mother.   HOME MEDICATIONS: Allergies as of 01/21/2024       Reactions   Ace Inhibitors Cough   Clarithromycin Rash        Medication List        Accurate as of January 21, 2024  9:48 AM. If you have any questions, ask your nurse or doctor.          Calcium  Carbonate-Vitamin D 300-100 MG-UNIT Caps Take by mouth.   metFORMIN  500 MG 24 hr tablet Commonly known as: GLUCOPHAGE -XR Take 1 tablet (500 mg total) by mouth daily with breakfast.    MULTIVITAMIN PO Take by mouth daily at 6 (six) AM.   NON FORMULARY   norethindrone 5 MG tablet Commonly known as: AYGESTIN Take 5 mg by mouth daily.   ondansetron  4 MG tablet Commonly known as: Zofran  Take 1 tablet (4 mg total) by mouth every 8 (eight) hours as needed for nausea or vomiting.   OneTouch Verio test strip Generic drug: glucose blood 1 each by Other route daily in the afternoon. Use as instructed   OneTouch Verio w/Device Kit 1 Device by Does not apply route daily in the afternoon.   OVER THE COUNTER MEDICATION Take 1 capsule by mouth daily as needed. [employer] Thyroid  complex.   sertraline  100 MG tablet Commonly known as: ZOLOFT  Take 1 tablet (100 mg total) by mouth daily.   temazepam  15 MG capsule Commonly known as: RESTORIL  TAKE 1 CAPSULE BY MOUTH AT BEDTIME AS NEEDED FOR SLEEP   thyroid  90 MG tablet Commonly known as: NP Thyroid  Take 1 tablet (90 mg total) by mouth daily.   traMADol  50 MG tablet Commonly known as: ULTRAM  Take 1 tablet (50 mg total) by mouth every 6 (six) hours as needed.   valsartan  80 MG tablet Commonly known as: DIOVAN  Take 1 tablet (80 mg total) by mouth daily.   VITAMIN B COMPLEX PO Take by mouth daily at 6 (six) AM.          REVIEW OF SYSTEMS: A comprehensive ROS was conducted with the patient and is negative except as per HPI     OBJECTIVE:  VS: BP 122/82   Ht 5' (1.524 m)   Wt 170 lb (77.1 kg)   BMI 33.20 kg/m    Wt Readings from Last 3 Encounters:  01/21/24 170 lb (77.1 kg)  11/14/23 173 lb 3.2 oz (78.6 kg)  07/04/23 176 lb (79.8 kg)     EXAM: General: Pt appears well and is in NAD  Neck: General: Supple without adenopathy. Thyroid : Thyroid  size normal.  No goiter or nodules appreciated.  Lungs: Clear with good BS bilat   Heart: Auscultation: RRR.  Extremities:  BL LE: No pretibial edema   Mental Status: Judgment, insight: Intact Orientation: Oriented to time, place, and person Mood  and affect: No depression, anxiety, or agitation   DM Foot Exam 01/21/2024  The skin of the feet is intact without sores or ulcerations. The pedal pulses are 2+ on right and 2+ on left. The sensation is intact to a screening 5.07, 10 gram monofilament bilaterally    DATA REVIEWED:    Latest Reference Range & Units 11/14/23 11:04  Sodium 135 - 145 mEq/L 141  Potassium 3.5 -  5.1 mEq/L 4.1  Chloride 96 - 112 mEq/L 105  CO2 19 - 32 mEq/L 28  Glucose 70 - 99 mg/dL 87  BUN 6 - 23 mg/dL 13  Creatinine 9.59 - 8.79 mg/dL 9.25  Calcium  8.4 - 10.5 mg/dL 9.4  Alkaline Phosphatase 39 - 117 U/L 68  Albumin 3.5 - 5.2 g/dL 4.3  AST 0 - 37 U/L 24  ALT 0 - 35 U/L 26  Total Protein 6.0 - 8.3 g/dL 7.2  Total Bilirubin 0.2 - 1.2 mg/dL 0.5  GFR >39.99 mL/min 98.03  Total CHOL/HDL Ratio  4  Cholesterol 0 - 200 mg/dL 824  HDL Cholesterol >60.99 mg/dL 56.19  LDL (calc) 0 - 99 mg/dL 892 (H)  NonHDL  868.92  Triglycerides 0.0 - 149.0 mg/dL 878.9  VLDL 0.0 - 59.9 mg/dL 75.7  (H): Data is abnormally high   Latest Reference Range & Units Most Recent  Thyroperoxidase Ab SerPl-aCnc 0 - 34 IU/mL 120 (H) 03/18/17 10:16     ASSESSMENT/PLAN/RECOMMENDATIONS:   1) Type 2 Diabetes Mellitus, Optimally controlled, With Hx  microalbuminuria complications - Most recent A1c of 6.0%. Goal A1c < 7.0 %.    - A1c is optimal -We have entertained SGLT2 inhibitors, should she continue with microalbuminuria but this has resolved - I have attempted to prescribe GLP-1 agonist, but this was declined by her insurance - Encouraged glucose checks at least once or twice weekly - No changes   MEDICATIONS: Continue metformin  500 mg XR, 1 tablet daily    2) Diabetic complications:    Eye: Does not have known diabetic retinopathy.  Neuro/ Feet: Does not have known diabetic peripheral neuropathy .  Renal: Patient does not have known baseline CKD. She  is  on an ACEI/ARB at present.     3) Hashimoto's thyroiditis  :  - Patient remains clinically euthyroid - TSH****  Medications : NP thyroid  90 mg daily   4) Dyslipidemia :   -LDL above goal -I had increased her atorvastatin  12/2022 but she developed headaches, cardiology switched to rosuvastatin  -Will defer further management to cardiology   5) Microalbuminuria:  -This is a slight elevation -She is already on Diovan  - Microalbuminuria has resolved    F/U in 6 months    Signed electronically by: Stefano Redgie Butts, MD  [facility] Endocrinology  [facility] [address] [place], [place] [address] Phone: [phone] FAX: [phone]   CC: Levora Reyes SAUNDERS, MD [id number] [place] [address] Phone: [phone] Fax: [phone]   Return to Endocrinology clinic as below: Future Appointments  Date Time Provider Department Center  01/21/2024  9:50 AM Beverly Atkinson, Donell Redgie, MD [facility] None  05/14/2024 10:20 AM Levora Reyes SAUNDERS, MD [facility] [place]        "

## 2024-01-22 ENCOUNTER — Ambulatory Visit: Payer: Self-pay | Admitting: Internal Medicine

## 2024-01-22 LAB — TSH: TSH: 0.54 m[IU]/L

## 2024-01-22 MED ORDER — THYROID 90 MG PO TABS: 90.0000 mg | ORAL_TABLET | Freq: Every day | ORAL | 3 refills | Status: AC

## 2024-02-04 ENCOUNTER — Other Ambulatory Visit: Payer: Self-pay | Admitting: Family Medicine

## 2024-02-04 DIAGNOSIS — G4700 Insomnia, unspecified: Secondary | ICD-10-CM

## 2024-02-06 ENCOUNTER — Telehealth: Payer: Self-pay

## 2024-02-06 ENCOUNTER — Other Ambulatory Visit (HOSPITAL_BASED_OUTPATIENT_CLINIC_OR_DEPARTMENT_OTHER): Payer: Self-pay | Admitting: Cardiology

## 2024-02-06 ENCOUNTER — Other Ambulatory Visit (HOSPITAL_COMMUNITY): Payer: Self-pay

## 2024-02-06 DIAGNOSIS — I10 Essential (primary) hypertension: Secondary | ICD-10-CM

## 2024-02-06 DIAGNOSIS — G4700 Insomnia, unspecified: Secondary | ICD-10-CM

## 2024-02-06 NOTE — Telephone Encounter (Signed)
 Pharmacy stated that patient needs a PA. Not a approval to refill.    Copied from CRM #[id number]. Topic: Clinical - Medication Prior Auth >> Feb 06, 2024  2:18 PM Nessti S wrote: Reason for CRM: pt called because [facility] stated that temazepam  (RESTORIL ) 15 MG capsule would need pcp prior aut. She would like someone to call pharmacy for refill.

## 2024-02-06 NOTE — Telephone Encounter (Signed)
 Oh interesting, no problem. I will send in a new rx

## 2024-02-06 NOTE — Addendum Note (Signed)
 Addended by: Tarnisha Kachmar A on: 02/06/2024 03:55 PM   Modules accepted: Orders

## 2024-02-06 NOTE — Telephone Encounter (Signed)
 Pharmacy Patient Advocate Encounter   Received notification from Physician's Office that prior authorization for Temazepam  15 mg capsule is required/requested.   Insurance verification completed.   The patient is insured through [facility].   Per test claim: The current 30 day co-pay is, $4.  No PA needed at this time. This test claim was processed through [facility]- copay amounts may vary at other pharmacies due to pharmacy/plan contracts, or as the patient moves through the different stages of their insurance plan.

## 2024-02-07 MED ORDER — TEMAZEPAM 15 MG PO CAPS: 15.0000 mg | ORAL_CAPSULE | Freq: Every evening | ORAL | 0 refills | Status: AC | PRN

## 2024-02-07 NOTE — Addendum Note (Signed)
 Addended by: Tammara Massing R on: 02/07/2024 03:32 PM   Modules accepted: Orders

## 2024-02-07 NOTE — Telephone Encounter (Signed)
 Controlled substance database reviewed.  Temazepam  15 mg #30 last filled on 01/01/2024.  Medication discussed at her October visit.  Refill ordered.

## 2024-05-14 ENCOUNTER — Ambulatory Visit: Admitting: Family Medicine

## 2024-07-21 ENCOUNTER — Ambulatory Visit: Admitting: Internal Medicine
